# Patient Record
Sex: Female | Born: 1980 | Race: Black or African American | Hispanic: No | Marital: Single | Smoking: Current every day smoker
Health system: Southern US, Community
[De-identification: ages and names within clinical notes are randomized; demographics above are authoritative.]

## PROBLEM LIST (undated history)

## (undated) ENCOUNTER — Inpatient Hospital Stay (HOSPITAL_COMMUNITY): Payer: Self-pay

## (undated) DIAGNOSIS — Z87440 Personal history of urinary (tract) infections: Secondary | ICD-10-CM

## (undated) DIAGNOSIS — B379 Candidiasis, unspecified: Secondary | ICD-10-CM

## (undated) DIAGNOSIS — Z8619 Personal history of other infectious and parasitic diseases: Secondary | ICD-10-CM

## (undated) DIAGNOSIS — IMO0002 Reserved for concepts with insufficient information to code with codable children: Secondary | ICD-10-CM

## (undated) DIAGNOSIS — Z862 Personal history of diseases of the blood and blood-forming organs and certain disorders involving the immune mechanism: Secondary | ICD-10-CM

## (undated) DIAGNOSIS — G43909 Migraine, unspecified, not intractable, without status migrainosus: Secondary | ICD-10-CM

## (undated) DIAGNOSIS — I10 Essential (primary) hypertension: Secondary | ICD-10-CM

## (undated) DIAGNOSIS — R87619 Unspecified abnormal cytological findings in specimens from cervix uteri: Secondary | ICD-10-CM

## (undated) DIAGNOSIS — Z202 Contact with and (suspected) exposure to infections with a predominantly sexual mode of transmission: Secondary | ICD-10-CM

## (undated) HISTORY — DX: Reserved for concepts with insufficient information to code with codable children: IMO0002

## (undated) HISTORY — DX: Personal history of diseases of the blood and blood-forming organs and certain disorders involving the immune mechanism: Z86.2

## (undated) HISTORY — DX: Migraine, unspecified, not intractable, without status migrainosus: G43.909

## (undated) HISTORY — DX: Unspecified abnormal cytological findings in specimens from cervix uteri: R87.619

## (undated) HISTORY — DX: Personal history of urinary (tract) infections: Z87.440

## (undated) HISTORY — DX: Personal history of other infectious and parasitic diseases: Z86.19

## (undated) HISTORY — DX: Contact with and (suspected) exposure to infections with a predominantly sexual mode of transmission: Z20.2

## (undated) HISTORY — DX: Candidiasis, unspecified: B37.9

---
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
## 2000-01-01 ENCOUNTER — Emergency Department (HOSPITAL_COMMUNITY): Admission: EM | Admit: 2000-01-01 | Discharge: 2000-01-01 | Payer: Self-pay | Admitting: *Deleted

## 2000-01-09 ENCOUNTER — Emergency Department (HOSPITAL_COMMUNITY): Admission: EM | Admit: 2000-01-09 | Discharge: 2000-01-09 | Payer: Self-pay | Admitting: Internal Medicine

## 2000-01-14 ENCOUNTER — Emergency Department (HOSPITAL_COMMUNITY): Admission: EM | Admit: 2000-01-14 | Discharge: 2000-01-14 | Payer: Self-pay | Admitting: Emergency Medicine

## 2001-10-09 ENCOUNTER — Encounter: Payer: Self-pay | Admitting: Obstetrics and Gynecology

## 2001-10-09 ENCOUNTER — Ambulatory Visit (HOSPITAL_COMMUNITY): Admission: RE | Admit: 2001-10-09 | Discharge: 2001-10-09 | Payer: Self-pay | Admitting: Obstetrics and Gynecology

## 2002-01-21 ENCOUNTER — Inpatient Hospital Stay (HOSPITAL_COMMUNITY): Admission: AD | Admit: 2002-01-21 | Discharge: 2002-01-21 | Payer: Self-pay | Admitting: *Deleted

## 2002-01-24 ENCOUNTER — Inpatient Hospital Stay (HOSPITAL_COMMUNITY): Admission: AD | Admit: 2002-01-24 | Discharge: 2002-01-24 | Payer: Self-pay | Admitting: Obstetrics and Gynecology

## 2002-02-10 ENCOUNTER — Inpatient Hospital Stay (HOSPITAL_COMMUNITY): Admission: AD | Admit: 2002-02-10 | Discharge: 2002-02-14 | Payer: Self-pay | Admitting: Obstetrics and Gynecology

## 2002-02-11 ENCOUNTER — Encounter (INDEPENDENT_AMBULATORY_CARE_PROVIDER_SITE_OTHER): Payer: Self-pay | Admitting: Specialist

## 2002-07-17 ENCOUNTER — Inpatient Hospital Stay (HOSPITAL_COMMUNITY): Admission: AD | Admit: 2002-07-17 | Discharge: 2002-07-17 | Payer: Self-pay | Admitting: Obstetrics and Gynecology

## 2002-10-03 ENCOUNTER — Other Ambulatory Visit: Admission: RE | Admit: 2002-10-03 | Discharge: 2002-10-03 | Payer: Self-pay | Admitting: Obstetrics and Gynecology

## 2003-04-14 ENCOUNTER — Inpatient Hospital Stay (HOSPITAL_COMMUNITY): Admission: RE | Admit: 2003-04-14 | Discharge: 2003-04-17 | Payer: Self-pay | Admitting: Obstetrics and Gynecology

## 2003-04-14 ENCOUNTER — Encounter (INDEPENDENT_AMBULATORY_CARE_PROVIDER_SITE_OTHER): Payer: Self-pay | Admitting: Specialist

## 2004-06-25 ENCOUNTER — Ambulatory Visit: Payer: Self-pay | Admitting: Internal Medicine

## 2004-11-15 ENCOUNTER — Emergency Department (HOSPITAL_COMMUNITY): Admission: EM | Admit: 2004-11-15 | Discharge: 2004-11-15 | Payer: Self-pay | Admitting: Emergency Medicine

## 2006-06-06 ENCOUNTER — Inpatient Hospital Stay (HOSPITAL_COMMUNITY): Admission: AD | Admit: 2006-06-06 | Discharge: 2006-06-06 | Payer: Self-pay | Admitting: Obstetrics

## 2007-01-18 ENCOUNTER — Inpatient Hospital Stay (HOSPITAL_COMMUNITY): Admission: RE | Admit: 2007-01-18 | Discharge: 2007-01-20 | Payer: Self-pay | Admitting: Obstetrics and Gynecology

## 2008-10-01 ENCOUNTER — Ambulatory Visit: Payer: Self-pay | Admitting: Obstetrics and Gynecology

## 2008-10-01 ENCOUNTER — Inpatient Hospital Stay (HOSPITAL_COMMUNITY): Admission: AD | Admit: 2008-10-01 | Discharge: 2008-10-01 | Payer: Self-pay | Admitting: Obstetrics & Gynecology

## 2008-12-03 ENCOUNTER — Ambulatory Visit: Payer: Self-pay | Admitting: Obstetrics & Gynecology

## 2008-12-03 ENCOUNTER — Encounter: Payer: Self-pay | Admitting: Family

## 2009-07-01 ENCOUNTER — Emergency Department (HOSPITAL_COMMUNITY): Admission: EM | Admit: 2009-07-01 | Discharge: 2009-07-01 | Payer: Self-pay | Admitting: Emergency Medicine

## 2010-08-07 LAB — POCT URINALYSIS DIP (DEVICE)
Bilirubin Urine: NEGATIVE
Specific Gravity, Urine: 1.02 (ref 1.005–1.030)

## 2010-08-07 LAB — POCT PREGNANCY, URINE: Preg Test, Ur: NEGATIVE

## 2010-08-09 LAB — COMPREHENSIVE METABOLIC PANEL
AST: 22 U/L (ref 0–37)
BUN: 8 mg/dL (ref 6–23)
CO2: 23 mEq/L (ref 19–32)
Calcium: 9 mg/dL (ref 8.4–10.5)
Creatinine, Ser: 0.77 mg/dL (ref 0.4–1.2)
Sodium: 137 mEq/L (ref 135–145)
Total Protein: 6.9 g/dL (ref 6.0–8.3)

## 2010-08-09 LAB — URINE MICROSCOPIC-ADD ON

## 2010-08-09 LAB — URINALYSIS, ROUTINE W REFLEX MICROSCOPIC
Glucose, UA: NEGATIVE mg/dL
Ketones, ur: 15 mg/dL — AB
Protein, ur: NEGATIVE mg/dL

## 2010-08-09 LAB — CBC
Hemoglobin: 13.6 g/dL (ref 12.0–15.0)
MCV: 100.6 fL — ABNORMAL HIGH (ref 78.0–100.0)
Platelets: 199 10*3/uL (ref 150–400)
RBC: 3.78 MIL/uL — ABNORMAL LOW (ref 3.87–5.11)
RDW: 12.8 % (ref 11.5–15.5)
WBC: 15.5 10*3/uL — ABNORMAL HIGH (ref 4.0–10.5)

## 2010-08-09 LAB — WET PREP, GENITAL: Yeast Wet Prep HPF POC: NONE SEEN

## 2010-08-09 LAB — GC/CHLAMYDIA PROBE AMP, GENITAL: GC Probe Amp, Genital: NEGATIVE

## 2010-09-14 NOTE — Op Note (Signed)
NAME:  Anita Fuentes, Anita Fuentes                ACCOUNT NO.:  [id number]   MEDICAL RECORD NO.:  [id number]          PATIENT TYPE:  INP   LOCATION:  9199                          FACILITY:  [facility]   PHYSICIAN:  Hal Morales, M.D.DATE OF BIRTH:  [birth year]   DATE OF PROCEDURE:  01/18/2007  DATE OF DISCHARGE:                               OPERATIVE REPORT   PREOPERATIVE DIAGNOSES:  1. Intrauterine pregnancy at 39 weeks' gestation  2. Prior cesarean section.  3. Desire for repeat cesarean section.   POSTOPERATIVE DIAGNOSES:  1. Intrauterine pregnancy at 39 weeks' gestation  2. Prior cesarean section.  3. Desire for repeat cesarean section.   OPERATION:  Repeat low transverse cesarean section.   SURGEON:  Dierdre Forth, MD   FIRST ASSISTANT:  Erin Sons, Certified Nurse Midwife.   ANESTHESIA:  Spinal.   ESTIMATED BLOOD LOSS:  750 mL.   COMPLICATIONS:  None.   FINDINGS:  The patient was delivered of a female infant weighing 6  pounds 5 ounces with Apgars of 9 and 9 at 1 and 5 minutes, respectively.  The uterus, tubes and ovaries were normal for the gravid state.  The  placenta contained an eccentrically inserted three-vessel cord.   PROCEDURE:  The patient was taken to the operating room after  appropriate identification and placed on the operating table.  After the  attainment of adequate spinal anesthesia, she was placed in the supine  position with a left lateral tilt.  The abdomen and perineum were  prepped with multiple layers of Betadine and a Foley catheter inserted  into the bladder under sterile conditions and connected to straight  drainage.  The abdomen was draped as a sterile field.  After assurance  of adequate anesthesia, the site of the previous cesarean section  incision was infiltrated with 20 mL of 0.25% Marcaine.  A suprapubic  incision was made at that site and the abdomen opened in layers.  The  peritoneum was entered and the bladder blade placed.  The uterus  was  incised approximately 2 cm above the uterovesical fold and that incision  taken laterally on either side bluntly.  The infant was delivered from  the occipitotransverse position and after having the nares and pharynx  suctioned and the cord clamped and cut, was handed off to the awaiting  pediatricians.  The appropriate cord blood was drawn and the placenta  allowed to separate from the uterus and was removed from the operative  field.  Uterine incision was closed with a running interlocking suture  of 0 Vicryl.  An imbricating suture of 0 Vicryl was placed and  hemostasis was achieved with a figure-of-eight suture of 0 Vicryl in the  incision.  Copious irrigation was carried out.  The abdominal peritoneum  was closed with a running suture of 2-0 Vicryl.  The rectus fascia was  closed with a running suture of 0 Vicryl, then reinforced on either side  of midline with figure-of-eight sutures of 0 Vicryl.  Subcutaneous  tissue was irrigated and made hemostatic with Bovie cautery.  The skin  incision was closed with a  subcuticular suture of 3-0 Monocryl.  Steri-  Strips were applied and a sterile dressing applied.  The patient was  taken from the operating room to the recovery room in satisfactory  condition, having tolerated the procedure well.  Sponge and instrument  counts were correct.  The infant went to the full-term nursery.      Hal Morales, M.D.  Electronically Signed     VPH/MEDQ  D:  01/18/2007  T:  01/18/2007  Job:  [id number]

## 2010-09-14 NOTE — H&P (Signed)
NAME:  Anita Fuentes, Anita Fuentes                ACCOUNT NO.:  [id number]   MEDICAL RECORD NO.:  [id number]          PATIENT TYPE:  INP   LOCATION:  NA                            [facility]   PHYSICIAN:  Vicki L. Emilee Hero, C.N.M.DATE OF BIRTH:  [birth year]   DATE OF ADMISSION:  01/18/2007  DATE OF DISCHARGE:                              HISTORY & PHYSICAL   ADDENDUM:  Addendum to dictation [phone].   PRENATAL LABS:  Hemoglobin at OB visit was 10.5, hematocrit 31.2,  platelet count was 225. Hepatitis B surface antigen was negative.  Rubella titer was immune. RPR was reactive with a positive __________  and a titer of 1:2 in April at her OB visit. Her blood type was O+,  antibody screen was negative. Quadruple screen was normal. Her HIV was  nonreactive. Her RPR titers to follow were, by July 16 titer was 1:4,  September 15 was 1:8 and September 17 was 1:2. On August 28, she had  negative group B strep vaginal cultures however, on September 3 she had  a positive urine culture for group B strep. Her Glucola was 80. She had  a negative GC and chlamydia culture in the third trimester.      Renaldo Reel Emilee Hero, C.N.M.     VLL/MEDQ  D:  01/17/2007  T:  01/17/2007  Job:  [id number]

## 2010-09-14 NOTE — Discharge Summary (Signed)
NAME:  Anita Fuentes, Anita Fuentes                ACCOUNT NO.:  [id number]   MEDICAL RECORD NO.:  [id number]          PATIENT TYPE:  INP   LOCATION:  9139                          [facility]   PHYSICIAN:  Crist Fat. Rivard, M.D. DATE OF BIRTH:  1980-10-25   DATE OF ADMISSION:  01/18/2007  DATE OF DISCHARGE:  01/20/2007                               DISCHARGE SUMMARY   ADMITTING DIAGNOSES:  1. Intrauterine pregnancy at 39 weeks.  2. Previous cesarean section x2 with desire for repeat.   DISCHARGE DIAGNOSES:  1. Intrauterine pregnancy at 39 weeks.  2. Previous cesarean section x2 with desire for repeat.   PROCEDURE:  Repeat low transverse cesarean section by Dr. Dierdre Forth with Nigel Bridgeman, CNM as first assistant.   HOSPITAL COURSE:  Anita Fuentes is a 30 year old, gravida 3, para 2-0-0-2 at  93 weeks who presents for scheduled repeat cesarean section.  Her  pregnancy has been followed by [facility] MD service and  has been remarkable for:  1. Previous cesarean section x2 with plan for repeat.  2. First trimester spotting.  3. History of STD.  4. Smoker.  5. History of syphilis.  6. Group B strep positive.   The patient was taken to the operating room where a repeat low  transverse cesarean section was performed. The infant was a viable  female weighing 6 pounds and 5 ounces with Apgar's of 9 at 1 minute and  9 at 5 minutes. The patient tolerated the remainder of the procedure  well and was taken to the recovery room in good condition. The infant  was taken to the full-term nursery and was doing well. By postop day #1,  her vital signs were stable.  She was afebrile.  Hemoglobin was 9.5 and  had been 11.2 preoperatively. Her other lab work was within normal  limits. The patient expressed a desire for early discharge. By postop  day #2 she continued to do well, infant was bottle feeding.  The patient  was wanting Depo-Provera for contraception and she desired to receive  her first dose in-hospital. Her vital signs remained stable. She was  afebrile.  She was deemed to have received the full benefit of her  hospital stay and she was discharged home.   DISCHARGE MEDICATIONS:  1. Motrin 600 mg 1 p.o. q.6 h p.r.n. pain.  2. Tylox 1-2 p.o. q.3-4 h p.r.n. pain.   DISCHARGE INSTRUCTIONS:  Per [facility] handout.   DISCHARGE FOLLOW-UP:  Will occur at [facility] OB[facility]GYN in 6 weeks  or as needed.      Cam Hai, C.N.M.      Crist Fat Rivard, M.D.  Electronically Signed    KS/MEDQ  D:  01/20/2007  T:  01/21/2007  Job:  [id number]
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
## 2010-09-17 NOTE — Discharge Summary (Signed)
NAME:  Anita Fuentes, Anita Fuentes                          ACCOUNT NO.:  [id number]   MEDICAL RECORD NO.:  [id number]                   PATIENT TYPE:  INP   LOCATION:  9102                                 [facility]   PHYSICIAN:  [name], M.D.                DATE OF BIRTH:  February 26, 1981   DATE OF ADMISSION:  02/10/2002  DATE OF DISCHARGE:  02/14/2002                                 DISCHARGE SUMMARY   ADMISSION DIAGNOSES:  1. Intrauterine pregnancy at term.  2. Early labor.  3. Positive RPR titers status post therapy.   DISCHARGE DIAGNOSES:  1. Intrauterine pregnancy at term.  2. Early labor.  3. Positive RPR titers status post therapy.  4. Failure to progress in labor.  5. Status post primary low transverse cesarean section.  6. Bottle feeding.  7. Desires Ortho-Evra for contraception.  8. Intrapartal hypertension.   PROCEDURE:  Primary low transverse cesarean section for delivery of a viable  female infant named Dennie Fetters who weighed 6 pounds 14 ounces and had Apgars of 9  and 9 on February 11, 2002 attended by Dr. Dierdre Forth.   HOSPITAL COURSE:  The patient is a 30 year old single black female gravida  1, para 0 at term who presented complaining of contractions every two to  three minutes.  She was uncomfortable with her uterine contractions and was  in early labor.  On admission she was 2, 80%, -2, and vertex.  She did have  some blood pressure elevations with diastolics over 100 on admission.  Her  other [facility] laboratories were within normal limits and protein was negative.  She did also receive a urine drug screen with consent and that was negative.  She progressed to approximately 4 cm, but failed to progress beyond that  even with adequate labor documented in [facility].  At that point she was  recommended to proceed with a cesarean section for delivery and underwent  the same.  She delivered a viable female infant named Dennie Fetters who weighed 6  pounds 14 ounces and had Apgars of 9  and 9.  He was noted to be in an OP  presentation.  Her cesarean section was done by Dr. Dierdre Forth.  Postoperatively the patient has done well.  She is ambulating, voiding, and  eating without difficulty.  Her vital signs are stable and she has been  afebrile throughout her hospital stay.  Since delivery she has had no  diastolic blood pressures greater than 90.  She is now ambulating, voiding,  and eating also without difficulty.  She is bottle feeding without  difficulty.  She desires Ortho-Evra patch for contraception.  She has had a  positive RPR with this pregnancy with a titer that rose to 16 late in her  pregnancy.  Received therapy approximately two weeks ago and her titers now  are down to 4.  She is deemed ready for  discharge today.   DISCHARGE INSTRUCTIONS:  As per the [facility] OB/GYN handout.   DISCHARGE MEDICATIONS:  1. Motrin 600 mg p.o. q.6h. p.r.n. for pain.  2. Tylox number two p.o. q.4-6h. p.r.n. for pain.  3. Prenatal vitamins daily.   DISCHARGE LABORATORIES:  Her hemoglobin is 8.9, WBC count 19.0, platelets  138,000.   DISCHARGE FOLLOWUP:  Six weeks at [facility] OB[facility]GYN or p.r.n.     Concha Pyo. Duplantis, C.N.M.              [name], M.D.    SJD/MEDQ  D:  02/14/2002  T:  02/14/2002  Job:  [id number]
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
## 2010-09-17 NOTE — Op Note (Signed)
NAME:  Anita Fuentes, Anita Fuentes                          ACCOUNT NO.:  [id number]   MEDICAL RECORD NO.:  [id number]                   PATIENT TYPE:  INP   LOCATION:  9102                                 [facility]   PHYSICIAN:  Hal Morales, M.D.             DATE OF BIRTH:  22-Oct-1980   DATE OF PROCEDURE:  02/11/2002  DATE OF DISCHARGE:                                 OPERATIVE REPORT   PREOPERATIVE DIAGNOSES:  1. Intrauterine pregnancy at term.  2. Failure to progress in labor.  3. History of a positive RPR.   POSTOPERATIVE DIAGNOSES:  1. Intrauterine pregnancy at term.  2. Failure to progress in labor.  3. History of a positive RPR.  4. Occiput posterior.   PROCEDURE:  Primary low transverse cesarean section.   SURGEON:  Hal Morales, M.D.   ASSISTANT:  Rica Koyanagi, C.N.M.   ANESTHESIA:  Epidural.   ESTIMATED BLOOD LOSS:  750 cc.   COMPLICATIONS:  None.   FINDINGS:  The patient was delivered of a female infant, whose name is Dennie Fetters,  weighing 6 pounds 14 ounces, with Apgars of 9 and 9 at one and five minutes,  respectively.  The uterus, tubes, and ovaries were normal for the gravid  state.   DESCRIPTION OF PROCEDURE:  The patient was taken to the operating room after  appropriate identification and after a discussion of the indications for her  procedure as well as the risks involved, which include but are not limited  to anesthesia, bleeding, infection, and damage to adjacent organs.  The  patient was placed on the operating table, and the labor epidural was dosed  for surgical anesthesia.  Her Foley catheter was in place.  The abdomen was  prepped with multiple layers of Betadine and draped as a sterile field.  After assurance of adequate anesthesia, a transverse incision was made in  the abdomen and the abdomen opened in layers.  The peritoneum was entered  and the bladder blade placed.  The uterus was incised approximately 2 cm  above the uterovesical  fold and the infant delivered from the occiput  posterior position.  The nares and pharynx were suctioned and the cord  clamped and cut.  The infant was handed off to the awaiting pediatricians.  Appropriate cord blood was drawn and the placenta noted to have separated  from the uterus and was removed from the operative field.  The uterine  incision was closed with a running interlocking suture of 0 Vicryl.  Imbricating of 0 Vicryl was placed and hemostasis was noted to be adequate.  Copious irrigation was carried out and the abdominal peritoneum closed with  a running suture of 2-0 Vicryl.  The rectus muscles were reapproximated in  the midline with figure-of-eight suture of 2-0 Vicryl.  The rectus fascia  was closed with a running suture of 0 Vicryl and reinforced on either side  of midline with figure-of-eight sutures of 0 Vicryl.  The subcutaneous  tissue was irrigated and made hemostatic with Bovie cautery.  Skin staples  were applied to the skin incision.  A sterile dressing was applied.  The  patient was taken from the operating room to the recovery room in  satisfactory condition, having tolerated the procedure well, with sponge and  instrument counts correct.  The infant went to the full-term nursery.                                                Hal Morales, M.D.    VPH/MEDQ  D:  02/11/2002  T:  02/11/2002  Job:  [id number]
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
## 2010-09-17 NOTE — Discharge Summary (Signed)
NAME:  Anita Fuentes, Anita Fuentes                          ACCOUNT NO.:  [id number]   MEDICAL RECORD NO.:  [id number]                   PATIENT TYPE:  INP   LOCATION:  9101                                 [facility]ILITY[facility]  [facility]   PHYSICIAN:  Hal Morales, M.D.             DATE OF BIRTH:  [birth year]   DATE OF ADMISSION:  04/14/2003  DATE OF DISCHARGE:  04/17/2003                                 DISCHARGE SUMMARY   ADMISSION DIAGNOSES:  1. Term pregnancy.  2. Prior cesarean section.   DISCHARGE DIAGNOSES:  1. Term pregnancy.  2. Prior cesarean section.  3. Status post repeat low transverse cesarean section for a viable female     infant named Kaiman weighing 5 pounds 15 ounces, Apgars 9 and 9.   PROCEDURE:  1. Spinal anesthesia.  2. Repeat low transverse cesarean section.   HOSPITAL COURSE:  The patient was admitted for an elective repeat low  transverse cesarean section which was performed under spinal anesthesia by  Dr. Pennie Rushing.  There were no complications during the surgery.  On  postoperative day #1 she was ambulating, voiding, and eating without  difficulty.  She elected to bottle feed her infant.  Discussion was made  regarding Depo-Provera and its potential for impact on bone density loss and  patient elected to proceed with Depo-Provera for contraception.  Her vital  signs were stable.  Hemoglobin was 11.9.  Abdomen was soft and appropriately  tender.  Dressing was clean, dry, and intact with old drainage and her RPR  titer was stable at 2.  On the next two days patient continued to improve.  On postoperative day #3 her vital signs were stable.  She was afebrile.  Heart regular rate and rhythm.  Lungs clear to auscultation.  Abdomen soft  and appropriately tender.  Incision clean, dry, and intact.  Lochia was  small.  Extremities within normal limits.  The patient was deemed to have  received the full benefit of her hospital stay and was discharged home.   DISCHARGE MEDICATIONS:  1.  Tylox one to two p.o. q.4h. p.r.n.  2. Motrin 600 mg p.o. q.6h. p.r.n.   DISCHARGE LABORATORIES:  White blood cell count 10.6, hemoglobin 10.4,  hematocrit 28.9, platelet count 147,000 which was previously 163,000.   DISCHARGE INSTRUCTIONS:  Per [facility] handout.   DISCHARGE FOLLOWUP:  Six weeks or p.r.n.    Marie L. Williams, C.N.M.                 Hal Morales, M.D.   MLW/MEDQ  D:  04/17/2003  T:  04/17/2003  Job:  [id number]

## 2010-09-17 NOTE — Op Note (Signed)
NAME:  MARTICIA, REIFSCHNEIDER                          ACCOUNT NO.:  [id number]   MEDICAL RECORD NO.:  [id number]                   PATIENT TYPE:  INP   LOCATION:  9198                                 [facility]ILITY[facility]   PHYSICIAN:  Hal Morales, M.D.             DATE OF BIRTH:  12/20/1980   DATE OF PROCEDURE:  04/14/2003  DATE OF DISCHARGE:                                 OPERATIVE REPORT   PREOPERATIVE DIAGNOSES:  1. Intrauterine pregnancy at term.  2. Prior cesarean section, desire for repeat cesarean section.   POSTOPERATIVE DIAGNOSES:  1. Intrauterine pregnancy at term.  2. Prior cesarean section, desire for repeat cesarean section.   OPERATION:  Repeat low transverse cesarean section.   ANESTHESIA:  Spinal anesthesia.   ESTIMATED BLOOD LOSS:  750 mL.   COMPLICATIONS:  None.   SURGEON:  Hal Morales, M.D.   FIRST ASSISTANT:  Rica Koyanagi, C.N.M.   FINDINGS:  The patient was delivered of a female infant whose name is Kaiman,  with Apgars of 9 and 9 at one and five minutes, respectively.  The uterus,  tubes and ovaries were normal for the gravid state.   DESCRIPTION OF PROCEDURE:  The patient was taken to the operating room after  appropriate identification and placed on the operating table.  After  placement of a spinal anesthetic, the patient was placed in the supine  position with a left lateral tilt.  The abdomen and perineum were prepped  with multiple layers of Betadine and a Foley catheter inserted into the  bladder and connected to straight drainage.  The abdomen was draped as a  sterile field.  A solution of 0.25% Marcaine was infiltrated a the site of  the previous cesarean section incision and an incision made.  The abdomen  was opened in layers.  The peritoneum was entered and the bladder blade  placed.  The uterus was incised approximately 2 cm above the ureterovesical  fold and the infant delivered from the occiput transverse position.  After  the  nares and pharynx were suctioned and the nuchal cord reduced, then  clamped and cut, the infant was handed off to the awaiting pediatricians.  The appropriate cord blood was drawn and the placenta noted to have  separated from the uterus and was removed from the operative field and sent  to pathology.  The uterine incision as closed with running interlocking  suture of 0 Vicryl.  An imbricating suture of 0 Vicryl was then place.  Copious irrigation was carried out and hemostasis noted to be adequate.  The  abdominal peritoneum was closed with a running suture of 2-0 Vicryl.  The  rectus muscles were reapproximated in the midline with figure-of-eight  suture of 2-0 Vicryl.  The rectus muscles were noted to be hemostatic and  were irrigated.  The rectus fascia was closed with running suture of 0  Vicryl, then reinforced  on either side of midline with figure-of-eight  sutures of 0 Vicryl.  The subcutaneous tissue was irrigated and made  hemostatic with Bovie cautery.  Skin staples were applied to the skin  incision and sterile dressing applied.  The patient was taken from the  operating room to the recovery room in satisfactory condition having  tolerated the procedure well with sponge and instrument counts correct.  The  infant went to the full-term nursery.                                               Hal Morales, M.D.    VPH/MEDQ  D:  04/14/2003  T:  04/14/2003  Job:  [id number]
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
## 2011-02-10 LAB — RAPID HIV SCREEN (WH-MAU): Rapid HIV Screen: NONREACTIVE

## 2011-02-10 LAB — CBC
HCT: 26.9 — ABNORMAL LOW
HCT: 31 — ABNORMAL LOW
Hemoglobin: 9.5 — ABNORMAL LOW
MCHC: 36.1 — ABNORMAL HIGH
RBC: 2.7 — ABNORMAL LOW
RBC: 3.14 — ABNORMAL LOW
RDW: 13.8
RDW: 14.5 — ABNORMAL HIGH

## 2011-02-10 LAB — RPR: RPR Ser Ql: REACTIVE — AB

## 2011-02-25 LAB — TPPA: Treponema Confirm: REACTIVE — AB

## 2011-08-06 ENCOUNTER — Encounter (HOSPITAL_COMMUNITY): Payer: Self-pay | Admitting: Obstetrics and Gynecology

## 2011-08-06 ENCOUNTER — Inpatient Hospital Stay (HOSPITAL_COMMUNITY): Payer: Medicaid Other

## 2011-08-06 ENCOUNTER — Inpatient Hospital Stay (HOSPITAL_COMMUNITY)
Admission: AD | Admit: 2011-08-06 | Discharge: 2011-08-06 | Disposition: A | Discharge status: Home / Self Care | Payer: Medicaid Other | Source: Ambulatory Visit | Attending: Obstetrics and Gynecology | Admitting: Obstetrics and Gynecology

## 2011-08-06 DIAGNOSIS — O99891 Other specified diseases and conditions complicating pregnancy: Secondary | ICD-10-CM | POA: Insufficient documentation

## 2011-08-06 DIAGNOSIS — N949 Unspecified condition associated with female genital organs and menstrual cycle: Secondary | ICD-10-CM

## 2011-08-06 DIAGNOSIS — Z331 Pregnant state, incidental: Secondary | ICD-10-CM

## 2011-08-06 DIAGNOSIS — R1032 Left lower quadrant pain: Secondary | ICD-10-CM | POA: Insufficient documentation

## 2011-08-06 DIAGNOSIS — R1031 Right lower quadrant pain: Secondary | ICD-10-CM | POA: Insufficient documentation

## 2011-08-06 DIAGNOSIS — R102 Pelvic and perineal pain: Secondary | ICD-10-CM

## 2011-08-06 LAB — URINALYSIS, ROUTINE W REFLEX MICROSCOPIC
Bilirubin Urine: NEGATIVE
Leukocytes, UA: NEGATIVE
Nitrite: NEGATIVE
pH: 6 (ref 5.0–8.0)

## 2011-08-06 LAB — WET PREP, GENITAL
Trich, Wet Prep: NONE SEEN
Yeast Wet Prep HPF POC: NONE SEEN

## 2011-08-06 LAB — HCG, QUANTITATIVE, PREGNANCY: hCG, Beta Chain, Quant, S: 17539 m[IU]/mL — ABNORMAL HIGH (ref <5)

## 2011-08-06 NOTE — Progress Notes (Signed)
Conni Elliot CNM in mother baby discharging a patient. She will be down to discharge pt. Ok for patient to wait in lobby until she comes down to talk to her. Pt ok with plan of care.

## 2011-08-06 NOTE — MAU Provider Note (Signed)
History    CSN: [id number]  Arrival date and time: 08/06/11 8657   First Provider Initiated Contact with Patient 08/06/11 1038      Chief Complaint  Patient presents with  . Abdominal Pain   Abdominal Pain This is a new problem. The current episode started in the past 7 days. The onset quality is sudden. The problem occurs intermittently. The problem has been unchanged. The pain is located in the LLQ and RLQ. The pain is moderate. The quality of the pain is burning and sharp. The abdominal pain does not radiate.   Pt presents unannounced to MAU with c/o intermittent lower abdominal pain x 3 days, right greater than left today.  Reports pain is increased with movement such as sitting up and rolling over.  Denies fever, nausea, vomiting, diarrhea, constipation or increased flatus.  Reports some spotting last week.  Denies increased vag d/c, itching, burning or odor.  Has appt with [facility] for NOB workup on 08/17/11.   OB History    Grav Para Term Preterm Abortions TAB SAB Ect Mult Living   4 3 3  0 0 0 0 0 0 3      Past Medical History  Diagnosis Date  . No pertinent past medical history     Past Surgical History  Procedure Date  . Cesarean section     Family History  Problem Relation Age of Onset  . Diabetes Father   . Hypertension Father   . Hypertension Sister     History  Substance Use Topics  . Smoking status: Current Everyday Smoker -- 0.5 packs/day for 10 years    Types: Cigarettes  . Smokeless tobacco: Never Used  . Alcohol Use: 1.8 oz/week    3 Cans of beer per week     3/day until pos UPT    Allergies: No Known Allergies  Prescriptions prior to admission  Medication Sig Dispense Refill  . Prenatal Vit-Fe Fumarate-FA (PRENATAL MULTIVITAMIN) TABS Take 1 tablet by mouth daily.        Review of Systems  Constitutional: Negative.   HENT: Negative.   Eyes: Negative.   Respiratory: Negative.   Cardiovascular: Negative.   Gastrointestinal: Positive for  abdominal pain.  Genitourinary: Negative.   Musculoskeletal: Negative.   Skin: Negative.   Neurological: Negative.   Endo/Heme/Allergies: Negative.   Psychiatric/Behavioral: Negative.    Physical Exam   Blood pressure 120/65, pulse 81, temperature 98.4 F (36.9 C), temperature source Oral, resp. rate 18, height 5\' 4"  (1.626 m), last menstrual period 06/24/2011.  Physical Exam  Constitutional: She is oriented to person, place, and time. She appears well-developed and well-nourished.  HENT:  Head: Normocephalic and atraumatic.  Right Ear: External ear normal.  Left Ear: External ear normal.  Nose: Nose normal.  Eyes: Conjunctivae are normal. Pupils are equal, round, and reactive to light.  Neck: Normal range of motion. Neck supple. No thyromegaly present.  Cardiovascular: Normal rate, regular rhythm and intact distal pulses.   Respiratory: Effort normal and breath sounds normal.  GI: Soft. Bowel sounds are normal. There is no tenderness. There is no rebound and no guarding.  Genitourinary: Vagina normal and uterus normal.       Ext gent/BUS WNL.  Scant white discharge in vault.  Cx without lesions or discharge.  Neg CMT.  Ut mobile, NT, retroverted, approx 6wk size.  Adnexa without masses or tenderness.    Musculoskeletal: Normal range of motion.  Neurological: She is alert and oriented to person, place, and  time. She has normal reflexes.  Skin: Skin is warm and dry.  Psychiatric: She has a normal mood and affect. Her behavior is normal.    MAU Course  Procedures Results for orders placed during the hospital encounter of 08/06/11 (from the past 24 hour(s))  URINALYSIS, ROUTINE W REFLEX MICROSCOPIC     Status: Normal   Collection Time   08/06/11  9:10 AM      Component Value Range   Color, Urine YELLOW  YELLOW    APPearance CLEAR  CLEAR    Specific Gravity, Urine 1.025  1.005 - 1.030    pH 6.0  5.0 - 8.0    Glucose, UA NEGATIVE  NEGATIVE (mg/dL)   Hgb urine dipstick NEGATIVE   NEGATIVE    Bilirubin Urine NEGATIVE  NEGATIVE    Ketones, ur NEGATIVE  NEGATIVE (mg/dL)   Protein, ur NEGATIVE  NEGATIVE (mg/dL)   Urobilinogen, UA 0.2  0.0 - 1.0 (mg/dL)   Nitrite NEGATIVE  NEGATIVE    Leukocytes, UA NEGATIVE  NEGATIVE   POCT PREGNANCY, URINE     Status: Abnormal   Collection Time   08/06/11  9:26 AM      Component Value Range   Preg Test, Ur POSITIVE (*) NEGATIVE   HCG, QUANTITATIVE, PREGNANCY     Status: Abnormal   Collection Time   08/06/11  9:40 AM      Component Value Range   hCG, Beta Chain, Quant, S 17539 (*) <5 (mIU/mL)   Assessment and Plan  Lower abdominal pain Positive pregnancy test  Gc/Chl and wet prep obtained Will check [place] to rule out ectopic.  US shows IUP.  Pt to follow up as scheduled in office  SMITH,NONA O. 08/06/2011, 11:20 AM

## 2011-08-08 LAB — GC/CHLAMYDIA PROBE AMP, GENITAL: Chlamydia, DNA Probe: NEGATIVE

## 2011-08-17 ENCOUNTER — Ambulatory Visit (INDEPENDENT_AMBULATORY_CARE_PROVIDER_SITE_OTHER): Payer: Medicaid Other | Admitting: Obstetrics and Gynecology

## 2011-08-17 DIAGNOSIS — Z331 Pregnant state, incidental: Secondary | ICD-10-CM

## 2011-08-17 LAB — POCT URINALYSIS DIPSTICK: pH, UA: 6

## 2011-08-18 LAB — PRENATAL PANEL VII
Basophils Absolute: 0 10*3/uL (ref 0.0–0.1)
Basophils Relative: 0 % (ref 0–1)
HIV: NONREACTIVE
Lymphocytes Relative: 29 % (ref 12–46)
MCHC: 33.5 g/dL (ref 30.0–36.0)
Monocytes Absolute: 0.5 10*3/uL (ref 0.1–1.0)
Neutro Abs: 7.4 10*3/uL (ref 1.7–7.7)
Neutrophils Relative %: 66 % (ref 43–77)
Platelets: 224 10*3/uL (ref 150–400)
RDW: 12.5 % (ref 11.5–15.5)
RPR Ser Ql: REACTIVE — AB
Rh Type: POSITIVE
WBC: 11.2 10*3/uL — ABNORMAL HIGH (ref 4.0–10.5)

## 2011-08-19 LAB — HEMOGLOBINOPATHY EVALUATION
Hemoglobin Other: 0 %
Hgb A2 Quant: 2.4 % (ref 2.2–3.2)
Hgb A: 96.3 % — ABNORMAL LOW (ref 96.8–97.8)
Hgb F Quant: 1.3 % (ref 0.0–2.0)
Hgb S Quant: 0 %

## 2011-08-19 LAB — CULTURE, OB URINE: Colony Count: 40000

## 2011-08-23 LAB — T.PALLIDUM AB, TOTAL: T pallidum Antibodies (TP-PA): >8 S/CO — ABNORMAL HIGH (ref <0.90)

## 2011-08-25 ENCOUNTER — Encounter: Payer: Self-pay | Admitting: Obstetrics and Gynecology

## 2011-08-26 ENCOUNTER — Encounter: Payer: Self-pay | Admitting: Obstetrics and Gynecology

## 2011-08-26 NOTE — Progress Notes (Signed)
Patient ID: Anita Fuentes, female   DOB: January 27, 1981, 31 y.o.   MRN: [id number]  NOB labs with     POSITIVE RPR Antibody 1:2  Repeated at 1:4  Chart reviewed Known with persistent low titers since at least 2003 Was retreated in September 2003 during pregnancy for rising titers 1:16 with Elisabeth Most PENG 24 M X1 Titers declined and have remained 1:2 to 1:4 since ([id number]) Plan is to check titers monthly and treat if rise > 1:4  Silverio Lay MD

## 2011-09-05 ENCOUNTER — Encounter: Payer: Self-pay | Admitting: Obstetrics and Gynecology

## 2011-09-05 ENCOUNTER — Ambulatory Visit (INDEPENDENT_AMBULATORY_CARE_PROVIDER_SITE_OTHER): Payer: Medicaid Other | Admitting: Obstetrics and Gynecology

## 2011-09-05 VITALS — BP 104/64 | Wt 157.0 lb

## 2011-09-05 DIAGNOSIS — N898 Other specified noninflammatory disorders of vagina: Secondary | ICD-10-CM

## 2011-09-05 DIAGNOSIS — A539 Syphilis, unspecified: Secondary | ICD-10-CM

## 2011-09-05 DIAGNOSIS — Z720 Tobacco use: Secondary | ICD-10-CM

## 2011-09-05 DIAGNOSIS — Z331 Pregnant state, incidental: Secondary | ICD-10-CM | POA: Insufficient documentation

## 2011-09-05 DIAGNOSIS — Z9189 Other specified personal risk factors, not elsewhere classified: Secondary | ICD-10-CM

## 2011-09-05 DIAGNOSIS — Z9289 Personal history of other medical treatment: Secondary | ICD-10-CM

## 2011-09-05 DIAGNOSIS — Z9889 Other specified postprocedural states: Secondary | ICD-10-CM

## 2011-09-05 DIAGNOSIS — F17200 Nicotine dependence, unspecified, uncomplicated: Secondary | ICD-10-CM

## 2011-09-05 DIAGNOSIS — Z98891 History of uterine scar from previous surgery: Secondary | ICD-10-CM | POA: Insufficient documentation

## 2011-09-05 DIAGNOSIS — O98119 Syphilis complicating pregnancy, unspecified trimester: Secondary | ICD-10-CM

## 2011-09-05 MED ORDER — METRONIDAZOLE 500 MG PO TABS: 500.0000 mg | ORAL_TABLET | Freq: Two times a day (BID) | ORAL | Status: AC

## 2011-09-05 NOTE — Progress Notes (Signed)
Patient ID: Anita Fuentes, female   DOB: 07/16/1980, 31 y.o.   MRN: [id number] TAWNYA Fuentes is a 31 y.o. female presenting for NOB certain LMP and [place] agrees with dates   @IPILAPH @ OB History    Grav Para Term Preterm Abortions TAB SAB Ect Mult Living   4 3 3  0 0 0 0 0 0 3     Past Medical History  Diagnosis Date  . No pertinent past medical history   . Migraines     TYPICALLY TAKES EXCEDERIN MIGRAINE   Past Surgical History  Procedure Date  . Cesarean section    Family History: family history includes Diabetes in her father and sister; Hypertension in her mother and sister; and Thyroid disease in her maternal uncle. Social History:  reports that she has been smoking Cigarettes.  She has a 5 pack-year smoking history. She has never used smokeless tobacco. She reports that she does not drink alcohol or use illicit drugs.  @ROS @    Blood pressure 104/64, weight 157 lb (71.215 kg), last menstrual period 06/24/2011. Physical exam: Calm, no distress, lungs clear bilaterally, AP RRR, abd soft, gravid, nt, bowel sounds active no edema to lower extremities, pelvis adequate, EGBUS WNL, cervix pink moist, vagina normal rugae, FH 10 week size  Prenatal labs: ABO, Rh: O/POS/-- (04/17 1143) Antibody: NEG (04/17 1143) Rubella:  Immune RPR: REACTIVE (04/17 1143)  HBsAg: NEGATIVE (04/17 1143)  HIV: NON REACTIVE (04/17 1143):     Assessment/Plan: Plans 1 st trimester screen, f/o syphillis titer q 4 weeks plan of care based on titers(age 31 syphillis with documented treatment 2008 at [facility])  Collaboration with Dr. Su Hilt per telephone [facility], [facility] 09/05/2011, 6:32 PM [name], CNM

## 2011-09-05 NOTE — Progress Notes (Signed)
C/o pelvic pain when coughing or moving too fast hospital visit 08/13/11 for the  same issue

## 2011-09-07 LAB — PAP IG, CT-NG NAA, HPV HIGH-RISK: HPV DNA High Risk: NOT DETECTED

## 2011-09-19 ENCOUNTER — Encounter: Payer: Self-pay | Admitting: Obstetrics and Gynecology

## 2011-09-20 ENCOUNTER — Telehealth: Payer: Self-pay | Admitting: Obstetrics and Gynecology

## 2011-09-20 NOTE — Telephone Encounter (Signed)
Triage/epic 

## 2011-09-21 NOTE — Telephone Encounter (Signed)
Spoke with Elfredia Nevins from [facility] state  Health dept wants to know if pt had recent RPR done inform 08/17/11 1:4 he voiced understanding

## 2011-10-03 ENCOUNTER — Ambulatory Visit (INDEPENDENT_AMBULATORY_CARE_PROVIDER_SITE_OTHER): Payer: Medicaid Other

## 2011-10-03 ENCOUNTER — Other Ambulatory Visit: Payer: Self-pay | Admitting: Obstetrics and Gynecology

## 2011-10-03 DIAGNOSIS — Z331 Pregnant state, incidental: Secondary | ICD-10-CM

## 2011-10-03 DIAGNOSIS — Z36 Encounter for antenatal screening of mother: Secondary | ICD-10-CM

## 2011-10-03 LAB — US OB COMP + 14 WK

## 2011-10-07 ENCOUNTER — Ambulatory Visit (INDEPENDENT_AMBULATORY_CARE_PROVIDER_SITE_OTHER): Payer: Medicaid Other | Admitting: Obstetrics and Gynecology

## 2011-10-07 VITALS — BP 104/68 | Wt 157.0 lb

## 2011-10-07 DIAGNOSIS — Z331 Pregnant state, incidental: Secondary | ICD-10-CM

## 2011-10-07 LAB — POCT URINALYSIS DIPSTICK
Glucose, UA: NEGATIVE
Nitrite, UA: NEGATIVE
Urobilinogen, UA: NEGATIVE

## 2011-10-07 NOTE — Progress Notes (Signed)
Pt. Stated having some pressure / pt stated no other issues today

## 2011-10-07 NOTE — Progress Notes (Signed)
Addended by: Tim Lair on: 10/07/2011 11:06 AM   Modules accepted: Orders

## 2011-10-07 NOTE — Progress Notes (Signed)
Doing well.  Does not want a tubal ligation.  Plan a C-section.  Return to office in 4 weeks.  Anatomy ultrasound next visit.  Quad screen today. RPR today.  VBAC consent form given.

## 2011-10-10 LAB — AFP, QUAD SCREEN
AFP: 40.7 IU/mL
Curr Gest Age: 15 wks.days
Down Syndrome Scr Risk Est: 1:1280 {titer}
Interpretation-AFP: NEGATIVE
MoM for AFP: 1.42
MoM for INH: 3.15
uE3 Mom: 1.27
uE3 Value: 0.5 ng/mL

## 2011-10-11 ENCOUNTER — Telehealth: Payer: Self-pay | Admitting: Obstetrics and Gynecology

## 2011-10-11 LAB — T.PALLIDUM AB, TOTAL: T pallidum Antibodies (TP-PA): >8 S/CO — ABNORMAL HIGH (ref <0.90)

## 2011-10-11 NOTE — Telephone Encounter (Signed)
TC to MFM. Spoke with Dr Melene Muller who agrees with plan to follow titers q 4 weeks as long as titers remain stable.   If should increase would need referral to MFM or Infectious Disease.

## 2011-10-11 NOTE — Telephone Encounter (Signed)
Message copied by [name] on Tue Oct 11, 2011 11:05 AM ------      Message from: [name]      Created: Sun Oct 09, 2011 11:40 AM      Regarding: h/o Syphilis       Please contact MFM.  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  I rec titers q4wks with f/u according to results.  Question for MFM: (titers are stable at 1:4) Is there anything else they would rec and do they want to do an official consultation?  If yes, please sched.              Thank you,      [name]            ----- Message -----         From: [name], CNM         Sent: 09/05/2011   6:43 PM           To: [name], MD            See RPR T pallidum results

## 2011-10-24 ENCOUNTER — Telehealth: Payer: Self-pay | Admitting: Obstetrics and Gynecology

## 2011-10-24 NOTE — Telephone Encounter (Signed)
Spoke with pt Anita Fuentes msg pt c/o severe headaches with no relief from tylenol pt states no swelling occ blurred vision offered pt an appt for eval pt has appt 10/25/11 at 11:00 with [facility] for eval of headaches advised pt can alternate tylenol and ibuprofen and drink some caffeine may help with headache until app tomorrow advised pt if headaches get worse or any other changes call office pt voice understanding

## 2011-10-24 NOTE — Telephone Encounter (Signed)
Triage/ob/epic °

## 2011-10-25 ENCOUNTER — Encounter: Payer: Medicaid Other | Admitting: Obstetrics and Gynecology

## 2011-10-27 ENCOUNTER — Telehealth: Payer: Self-pay | Admitting: Obstetrics and Gynecology

## 2011-10-27 NOTE — Telephone Encounter (Signed)
TC to pt. LM to return call regarding message. 

## 2011-10-27 NOTE — Telephone Encounter (Signed)
Triage/epic/[facility]. °

## 2011-11-04 ENCOUNTER — Ambulatory Visit (INDEPENDENT_AMBULATORY_CARE_PROVIDER_SITE_OTHER): Payer: Medicaid Other

## 2011-11-04 ENCOUNTER — Other Ambulatory Visit: Payer: Self-pay | Admitting: Obstetrics and Gynecology

## 2011-11-04 VITALS — BP 100/72 | Wt 162.0 lb

## 2011-11-04 DIAGNOSIS — Z331 Pregnant state, incidental: Secondary | ICD-10-CM

## 2011-11-04 DIAGNOSIS — Z2089 Contact with and (suspected) exposure to other communicable diseases: Secondary | ICD-10-CM

## 2011-11-04 DIAGNOSIS — Z862 Personal history of diseases of the blood and blood-forming organs and certain disorders involving the immune mechanism: Secondary | ICD-10-CM

## 2011-11-04 DIAGNOSIS — Z202 Contact with and (suspected) exposure to infections with a predominantly sexual mode of transmission: Secondary | ICD-10-CM

## 2011-11-04 DIAGNOSIS — Z8619 Personal history of other infectious and parasitic diseases: Secondary | ICD-10-CM

## 2011-11-04 DIAGNOSIS — Z3689 Encounter for other specified antenatal screening: Secondary | ICD-10-CM

## 2011-11-04 HISTORY — DX: Contact with and (suspected) exposure to infections with a predominantly sexual mode of transmission: Z20.2

## 2011-11-04 HISTORY — DX: Personal history of diseases of the blood and blood-forming organs and certain disorders involving the immune mechanism: Z86.2

## 2011-11-04 LAB — US OB COMP + 14 WK

## 2011-11-04 NOTE — Progress Notes (Signed)
C/o of HA's no relief with Tylenol nor Ibuprofen

## 2011-11-04 NOTE — Progress Notes (Signed)
RPR today.  Rev'd neg Quad.  Pt didn't c/o of HA during exam, but if issues before NV, will refer to [facility] prn. Anatomy u/s today:  SIUP w/ size c/w dates (AUA=[redacted]w[redacted]d); EFW=11oz (89.3%); cx=3.05.  All anatomy seen & WNL.  Female. Anterior placenta.

## 2011-11-08 ENCOUNTER — Telehealth: Payer: Self-pay | Admitting: Obstetrics and Gynecology

## 2011-11-08 MED ORDER — PRENATAL MULTIVITAMIN CH: 1.0000 | ORAL_TABLET | Freq: Every day | ORAL | Status: DC

## 2011-11-08 NOTE — Telephone Encounter (Signed)
Triage/[facility]. [facility]. 

## 2011-11-09 ENCOUNTER — Telehealth: Payer: Self-pay | Admitting: Obstetrics and Gynecology

## 2011-11-09 NOTE — Telephone Encounter (Signed)
Tc to pt. Informed pt pnv's e-pres to pharm on 11/08/11 per Bonita Quin M,M. Pt voices understanding.

## 2011-11-09 NOTE — Telephone Encounter (Signed)
Triage/epic 

## 2011-12-02 ENCOUNTER — Ambulatory Visit (INDEPENDENT_AMBULATORY_CARE_PROVIDER_SITE_OTHER): Payer: Medicaid Other | Admitting: Obstetrics and Gynecology

## 2011-12-02 ENCOUNTER — Encounter: Payer: Self-pay | Admitting: Obstetrics and Gynecology

## 2011-12-02 ENCOUNTER — Other Ambulatory Visit: Payer: Medicaid Other

## 2011-12-02 VITALS — BP 104/62 | Wt 163.0 lb

## 2011-12-02 DIAGNOSIS — Z3480 Encounter for supervision of other normal pregnancy, unspecified trimester: Secondary | ICD-10-CM

## 2011-12-02 NOTE — Patient Instructions (Signed)
How a Baby Grows During Pregnancy Pregnancy begins when the female's sperm enters the female's egg. This happens in the fallopian tube and is called fertilization. The fertilized egg is called an embryo until it reaches 9 weeks from the time of fertilization. From 9 weeks until birth it is called a fetus. The fertilized egg moves down the tube into the uterus and attaches to the inside lining of the uterus.  The pregnant woman is responsible for the growth of the embryo/fetus by supplying nourishment and oxygen through the blood stream and placenta to the developing fetus. The uterus becomes larger and pops out from the abdomen more and more as the fetus develops and grows. A normal pregnancy lasts 280 days, with a range of 259 to 294 days, or 40 weeks. The pregnancy is divided up into three trimesters:  First trimester - 0 to 13 weeks.   Second trimester - 14 to 27 weeks.   Third trimester - 28 to 40 weeks.  The day your baby is supposed to be born is called estimated date of confinement [facility]) or estimated date of delivery (EDD). GROWTH OF THE BABY MONTH BY MONTH 1. First Month: The fertilized egg attaches to the inside of the uterus and certain cells will form the placenta and others will develop into the fetus. The arms, legs, brain, spinal cord, lungs, and heart begin to develop. At the end of the first month the heart begins to beat. The embryo weighs less than an ounce and is  inch long.  2. Second Month: The bones can be seen, the inner ear, eye lids, hands and feet form and genitals develop. By the end of 8 weeks, all of the major organs are developing. The fetus now weighs less than an ounce and is one inch (2.54 cm) long.  3. Third Month: Teeth buds appear, all the internal organs are forming, bones and muscles begin to grow, the spine can flex and the skin is transparent. Finger and toe nails begin to form, the hands develop faster than the feet and the arms are longer than the legs at this  point. The fetus weighs a little more than an ounce (0.03 kg) and is 3 inches (8.89cm) long.  4. Fourth Month: The placenta is completely formed. The external sex organs, neck, outer ear, eyebrows, eyelids and fingernails are formed. The fetus can hear, swallow, flex its arms and legs and the kidney begins to produce urine. The skin is covered with a white waxy coating (vernix) and very thin hair (lanugo) is present. The fetus weighs 5 ounces (0.14kg) and is 6 to 7 inches (16.51cm) long.  5. Fifth Month: The fetus moves around more and can be felt for the first time (called quickening), sleeps and wakes up at times, may begin to suck its finger and the nails grow to the end of the fingers. The gallbladder is now functioning and helps to digest the nutrients, eggs are formed in the female and the testicles begin to drop down from the abdomen to the scrotum in the female. The fetus weighs  to 1 pound (0.45kg) and is 10 inches (25.4cm) long.  6. Sixth Month: The lungs are formed but the fetus does not breath yet. The eyes open, the brain develops more quickly at this time, one can detect finger and toe prints and thicker hair grows. The fetus weighs 1 to 1 pounds (0.68kg) and is 12 inches (30.48cm) long.  7. Seventh Month: The fetus can hear and  respond to sounds, kicks and stretches and can sense changes in light. The fetus weighs 2 to 2 pounds (1.13kg) and is 14 inches (35.56cm) long.  8. Eight Month: All organs and body systems are fully developed and functioning. The bones get harder, taste buds develop and can taste sweet and sour flavors and the fetus may hiccup now. Different parts of the brain are developing and the skull remains soft for the brain to grow. The fetus weighs 5 pounds (2.27kg) and is 18 inches (45.75cm) long.  9. Ninth Month: The fetus gains about a half a pound a week, the lungs are fully developed, patterns of sleep develop and the head moves down into the bottom of the uterus called  vertex. If the buttocks moves into the bottom of the uterus, it is called a breech. The fetus weighs 6 to 9 pounds (2.72 to 4.08kg) and is 20 inches (50.8cm) long.  You should be informed about your pregnancy, yourself and how the baby is developing as much as possible. Being informed helps you to enjoy this experience. It also gives you the sense to feel if something is not going right and when to ask questions. Talk to your caregiver when you have questions about your baby or your own body. Document Released: 10/05/2007 Document Revised: 04/07/2011 Document Reviewed: 10/05/2007 [facility] Patient Information 2012 [place], [place].ABCs of Pregnancy A Antepartum care is very important. Be sure you see your doctor and get prenatal care as soon as you think you are pregnant. At this time, you will be tested for infection, genetic abnormalities and potential problems with you and the pregnancy. This is the time to discuss diet, exercise, work, medications, labor, pain medication during labor and the possibility of a cesarean delivery. Ask any questions that may concern you. It is important to see your doctor regularly throughout your pregnancy. Avoid exposure to toxic substances and chemicals - such as cleaning solvents, lead and mercury, some insecticides, and paint. Pregnant women should avoid exposure to paint fumes, and fumes that cause you to feel ill, dizzy or faint. When possible, it is a good idea to have a pre-pregnancy consultation with your caregiver to begin some important recommendations your caregiver suggests such as, taking folic acid, exercising, quitting smoking, avoiding alcoholic beverages, etc. B Breastfeeding is the healthiest choice for both you and your baby. It has many nutritional benefits for the baby and health benefits for the mother. It also creates a very tight and loving bond between the baby and mother. Talk to your doctor, your family and friends, and your employer about how you  choose to feed your baby and how they can support you in your decision. Not all birth defects can be prevented, but a woman can take actions that may increase her chance of having a healthy baby. Many birth defects happen very early in pregnancy, sometimes before a woman even knows she is pregnant. Birth defects or abnormalities of any child in your or the father's family should be discussed with your caregiver. Get a good support bra as your breast size changes. Wear it especially when you exercise and when nursing.  C Celebrate the news of your pregnancy with the your spouse/father and family. Childbirth classes are helpful to take for you and the spouse/father because it helps to understand what happens during the pregnancy, labor and delivery. Cesarean delivery should be discussed with your doctor so you are prepared for that possibility. The pros and cons of circumcision if it is  a boy, should be discussed with your pediatrician. Cigarette smoking during pregnancy can result in low birth weight babies. It has been associated with infertility, miscarriages, tubal pregnancies, infant death (mortality) and poor health (morbidity) in childhood. Additionally, cigarette smoking may cause long-term learning disabilities. If you smoke, you should try to quit before getting pregnant and not smoke during the pregnancy. Secondary smoke may also harm a mother and her developing baby. It is a good idea to ask people to stop smoking around you during your pregnancy and after the baby is born. Extra calcium is necessary when you are pregnant and is found in your prenatal vitamin, in dairy products, green leafy vegetables and in calcium supplements. D A healthy diet according to your current weight and height, along with vitamins and mineral supplements should be discussed with your caregiver. Domestic abuse or violence should be made known to your doctor right away to get the situation corrected. Drink more water when you  exercise to keep hydrated. Discomfort of your back and legs usually develops and progresses from the middle of the second trimester through to delivery of the baby. This is because of the enlarging baby and uterus, which may also affect your balance. Do not take illegal drugs. Illegal drugs can seriously harm the baby and you. Drink extra fluids (water is best) throughout pregnancy to help your body keep up with the increases in your blood volume. Drink at least 6 to 8 glasses of water, fruit juice, or milk each day. A good way to know you are drinking enough fluid is when your urine looks almost like clear water or is very light yellow.  E Eat healthy to get the nutrients you and your unborn baby need. Your meals should include the five basic food groups. Exercise (30 minutes of light to moderate exercise a day) is important and encouraged during pregnancy, if there are no medical problems or problems with the pregnancy. Exercise that causes discomfort or dizziness should be stopped and reported to your caregiver. Emotions during pregnancy can change from being ecstatic to depression and should be understood by you, your partner and your family. F Fetal screening with ultrasound, amniocentesis and monitoring during pregnancy and labor is common and sometimes necessary. Take 400 micrograms of folic acid daily both before, when possible, and during the first few months of pregnancy to reduce the risk of birth defects of the brain and spine. All women who could possibly become pregnant should take a vitamin with folic acid, every day. It is also important to eat a healthy diet with fortified foods (enriched grain products, including cereals, rice, breads, and pastas) and foods with natural sources of folate (orange juice, green leafy vegetables, beans, peanuts, broccoli, asparagus, peas, and lentils). The father should be involved with all aspects of the pregnancy including, the prenatal care, childbirth classes,  labor, delivery, and postpartum time. Fathers may also have emotional concerns about being a father, financial needs, and raising a family. G Genetic testing should be done appropriately. It is important to know your family and the father's history. If there have been problems with pregnancies or birth defects in your family, report these to your doctor. Also, genetic counselors can talk with you about the information you might need in making decisions about having a family. You can call a major medical center in your area for help in finding a board-certified genetic counselor. Genetic testing and counseling should be done before pregnancy when possible, especially if there is  a history of problems in the mother's or father's family. Certain ethnic backgrounds are more at risk for genetic defects. H Get familiar with the hospital where you will be having your baby. Get to know how long it takes to get there, the labor and delivery area, and the hospital procedures. Be sure your medical insurance is accepted there. Get your home ready for the baby including, clothes, the baby's room (when possible), furniture and car seat. Hand washing is important throughout the day, especially after handling raw meat and poultry, changing the baby's diaper or using the bathroom. This can help prevent the spread of many bacteria and viruses that cause infection. Your hair may become dry and thinner, but will return to normal a few weeks after the baby is born. Heartburn is a common problem that can be treated by taking antacids recommended by your caregiver, eating smaller meals 5 or 6 times a day, not drinking liquids when eating, drinking between meals and raising the head of your bed 2 to 3 inches. I Insurance to cover you, the baby, doctor and hospital should be reviewed so that you will be prepared to pay any costs not covered by your insurance plan. If you do not have medical insurance, there are usually clinics and  services available for you in your community. Take 30 milligrams of iron during your pregnancy as prescribed by your doctor to reduce the risk of low red blood cells (anemia) later in pregnancy. All women of childbearing age should eat a diet rich in iron. J There should be a joint effort for the mother, father and any other children to adapt to the pregnancy financially, emotionally, and psychologically during the pregnancy. Join a support group for moms-to-be. Or, join a class on parenting or childbirth. Have the family participate when possible. K Know your limits. Let your caregiver know if you experience any of the following:   Pain of any kind.   Strong cramps.   You develop a lot of weight in a short period of time (5 pounds in 3 to 5 days).   Vaginal bleeding, leaking of amniotic fluid.   Headache, vision problems.   Dizziness, fainting, shortness of breath.   Chest pain.   Fever of 102 F (38.9 C) or higher.   Gush of clear fluid from your vagina.   Painful urination.   Domestic violence.   Irregular heartbeat (palpitations).   Rapid beating of the heart (tachycardia).   Constant feeling sick to your stomach (nauseous) and vomiting.   Trouble walking, fluid retention (edema).   Muscle weakness.   If your baby has decreased activity.   Persistent diarrhea.   Abnormal vaginal discharge.   Uterine contractions at 20-minute intervals.   Back pain that travels down your leg.  L Learn and practice that what you eat and drink should be in moderation and healthy for you and your baby. Legal drugs such as alcohol and caffeine are important issues for pregnant women. There is no safe amount of alcohol a woman can drink while pregnant. Fetal alcohol syndrome, a disorder characterized by growth retardation, facial abnormalities, and central nervous system dysfunction, is caused by a woman's use of alcohol during pregnancy. Caffeine, found in tea, coffee, soft drinks and  chocolate, should also be limited. Be sure to read labels when trying to cut down on caffeine during pregnancy. More than 200 foods, beverages, and over-the-counter medications contain caffeine and have a high salt content! There are coffees and teas  that do not contain caffeine. M Medical conditions such as diabetes, epilepsy, and high blood pressure should be treated and kept under control before pregnancy when possible, but especially during pregnancy. Ask your caregiver about any medications that may need to be changed or adjusted during pregnancy. If you are currently taking any medications, ask your caregiver if it is safe to take them while you are pregnant or before getting pregnant when possible. Also, be sure to discuss any herbs or vitamins you are taking. They are medicines, too! Discuss with your doctor all medications, prescribed and over-the-counter, that you are taking. During your prenatal visit, discuss the medications your doctor may give you during labor and delivery. N Never be afraid to ask your doctor or caregiver questions about your health, the progress of the pregnancy, family problems, stressful situations, and recommendation for a pediatrician, if you do not have one. It is better to take all precautions and discuss any questions or concerns you may have during your office visits. It is a good idea to write down your questions before you visit the doctor. O Over-the-counter cough and cold remedies may contain alcohol or other ingredients that should be avoided during pregnancy. Ask your caregiver about prescription, herbs or over-the-counter medications that you are taking or may consider taking while pregnant.  P Physical activity during pregnancy can benefit both you and your baby by lessening discomfort and fatigue, providing a sense of well-being, and increasing the likelihood of early recovery after delivery. Light to moderate exercise during pregnancy strengthens the belly  (abdominal) and back muscles. This helps improve posture. Practicing yoga, walking, swimming, and cycling on a stationary bicycle are usually safe exercises for pregnant women. Avoid scuba diving, exercise at high altitudes (over 3000 feet), skiing, horseback riding, contact sports, etc. Always check with your doctor before beginning any kind of exercise, especially during pregnancy and especially if you did not exercise before getting pregnant. Q Queasiness, stomach upset and morning sickness are common during pregnancy. Eating a couple of crackers or dry toast before getting out of bed. Foods that you normally love may make you feel sick to your stomach. You may need to substitute other nutritious foods. Eating 5 or 6 small meals a day instead of 3 large ones may make you feel better. Do not drink with your meals, drink between meals. Questions that you have should be written down and asked during your prenatal visits. R Read about and make plans to baby-proof your home. There are important tips for making your home a safer environment for your baby. Review the tips and make your home safer for you and your baby. Read food labels regarding calories, salt and fat content in the food. S Saunas, hot tubs, and steam rooms should be avoided while you are pregnant. Excessive high heat may be harmful during your pregnancy. Your caregiver will screen and examine you for sexually transmitted diseases and genetic disorders during your prenatal visits. Learn the signs of labor. Sexual relations while pregnant is safe unless there is a medical or pregnancy problem and your caregiver advises against it. T Traveling long distances should be avoided especially in the third trimester of your pregnancy. If you do have to travel out of state, be sure to take a copy of your medical records and medical insurance plan with you. You should not travel long distances without seeing your doctor first. Most airlines will not allow  you to travel after 36 weeks of pregnancy. Toxoplasmosis is  an infection caused by a parasite that can seriously harm an unborn baby. Avoid eating undercooked meat and handling cat litter. Be sure to wear gloves when gardening. Tingling of the hands and fingers is not unusual and is due to fluid retention. This will go away after the baby is born. U Womb (uterus) size increases during the first trimester. Your kidneys will begin to function more efficiently. This may cause you to feel the need to urinate more often. You may also leak urine when sneezing, coughing or laughing. This is due to the growing uterus pressing against your bladder, which lies directly in front of and slightly under the uterus during the first few months of pregnancy. If you experience burning along with frequency of urination or bloody urine, be sure to tell your doctor. The size of your uterus in the third trimester may cause a problem with your balance. It is advisable to maintain good posture and avoid wearing high heels during this time. An ultrasound of your baby may be necessary during your pregnancy and is safe for you and your baby. V Vaccinations are an important concern for pregnant women. Get needed vaccines before pregnancy. Center for Disease Control ([employer]) has clear guidelines for the use of vaccines during pregnancy. Review the list, be sure to discuss it with your doctor. Prenatal vitamins are helpful and healthy for you and the baby. Do not take extra vitamins except what is recommended. Taking too much of certain vitamins can cause overdose problems. Continuous vomiting should be reported to your caregiver. Varicose veins may appear especially if there is a family history of varicose veins. They should subside after the delivery of the baby. Support hose helps if there is leg discomfort. W Being overweight or underweight during pregnancy may cause problems. Try to get within 15 pounds of your ideal weight  before pregnancy. Remember, pregnancy is not a time to be dieting! Do not stop eating or start skipping meals as your weight increases. Both you and your baby need the calories and nutrition you receive from a healthy diet. Be sure to consult with your doctor about your diet. There is a formula and diet plan available depending on whether you are overweight or underweight. Your caregiver or nutritionist can help and advise you if necessary. X Avoid X-rays. If you must have dental work or diagnostic tests, tell your dentist or physician that you are pregnant so that extra care can be taken. X-rays should only be taken when the risks of not taking them outweigh the risk of taking them. If needed, only the minimum amount of radiation should be used. When X-rays are necessary, protective lead shields should be used to cover areas of the body that are not being X-rayed. Y Your baby loves you. Breastfeeding your baby creates a loving and very close bond between the two of you. Give your baby a healthy environment to live in while you are pregnant. Infants and children require constant care and guidance. Their health and safety should be carefully watched at all times. After the baby is born, rest or take a nap when the baby is sleeping. Z Get your ZZZs. Be sure to get plenty of rest. Resting on your side as often as possible, especially on your left side is advised. It provides the best circulation to your baby and helps reduce swelling. Try taking a nap for 30 to 45 minutes in the afternoon when possible. After the baby is born rest or take a  nap when the baby is sleeping. Try elevating your feet for that amount of time when possible. It helps the circulation in your legs and helps reduce swelling.  Most information courtesy of the CDC. Document Released: 04/18/2005 Document Revised: 04/07/2011 Document Reviewed: 12/31/2008 [facility] Patient Information 2012 [place], [place].

## 2011-12-02 NOTE — Progress Notes (Signed)
Pt c/o pain in left leg and pain and pressure in vaginal area

## 2011-12-02 NOTE — Progress Notes (Signed)
Pt with left thigh pain.  Good ROM no swelling.  Offered PT pt declined cx L/CL glucola @[place]  Pt planning repeat c/s h/o c/s times three

## 2011-12-07 ENCOUNTER — Telehealth: Payer: Self-pay | Admitting: Obstetrics and Gynecology

## 2011-12-07 NOTE — Telephone Encounter (Signed)
TRIAGE/EPIC °

## 2011-12-07 NOTE — Telephone Encounter (Signed)
Spoke with pt rgd msg pt wants to know if she can get DNA testing while still pregnant advised pt only way to do that is by amniocentesis not aware of any physician who will do that procedure if not medically necessary most times they do it  at delivery by blood test pt voice understanding

## 2011-12-30 ENCOUNTER — Encounter: Payer: Medicaid Other | Admitting: Obstetrics and Gynecology

## 2011-12-30 ENCOUNTER — Ambulatory Visit (INDEPENDENT_AMBULATORY_CARE_PROVIDER_SITE_OTHER): Payer: Medicaid Other | Admitting: Obstetrics and Gynecology

## 2011-12-30 ENCOUNTER — Other Ambulatory Visit: Payer: Medicaid Other

## 2011-12-30 VITALS — BP 100/60 | Wt 171.0 lb

## 2011-12-30 DIAGNOSIS — Z3490 Encounter for supervision of normal pregnancy, unspecified, unspecified trimester: Secondary | ICD-10-CM

## 2011-12-30 DIAGNOSIS — Z331 Pregnant state, incidental: Secondary | ICD-10-CM

## 2011-12-30 LAB — CBC
HCT: 30.5 % — ABNORMAL LOW (ref 36.0–46.0)
Hemoglobin: 10.6 g/dL — ABNORMAL LOW (ref 12.0–15.0)
MCHC: 34.8 g/dL (ref 30.0–36.0)

## 2011-12-30 NOTE — Progress Notes (Signed)
[redacted]w[redacted]d Patient is complaining of Rt inguinal pain.  Examination of Hip joint with extension and abduction. No hernia. No negative finding. 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 No change vaginal secretions. 1 hr Glucola today. ROB x 2 weeks

## 2011-12-31 LAB — RPR: RPR Ser Ql: REACTIVE — AB

## 2012-01-03 LAB — GLUCOSE TOLERANCE, 1 HOUR (50G) W/O FASTING: Glucose, 1 Hour GTT: 126 mg/dL (ref 70–140)

## 2012-01-03 LAB — T.PALLIDUM AB, TOTAL: T pallidum Antibodies (TP-PA): >8 S/CO — ABNORMAL HIGH (ref <0.90)

## 2012-01-13 ENCOUNTER — Ambulatory Visit (INDEPENDENT_AMBULATORY_CARE_PROVIDER_SITE_OTHER): Payer: Medicaid Other | Admitting: Obstetrics and Gynecology

## 2012-01-13 ENCOUNTER — Encounter: Payer: Self-pay | Admitting: Obstetrics and Gynecology

## 2012-01-13 VITALS — BP 116/74 | Wt 174.0 lb

## 2012-01-13 DIAGNOSIS — Z331 Pregnant state, incidental: Secondary | ICD-10-CM

## 2012-01-13 MED ORDER — CYCLOBENZAPRINE HCL 5 MG PO TABS: 5.0000 mg | ORAL_TABLET | Freq: Three times a day (TID) | ORAL | Status: AC | PRN

## 2012-01-13 NOTE — Patient Instructions (Signed)
Fetal Movement Counts Patient Name: __________________________________________________ Patient Due Date: ____________________ Kick counts is highly recommended in high risk pregnancies, but it is a good idea for every pregnant woman to do. Start counting fetal movements at 28 weeks of the pregnancy. Fetal movements increase after eating a full meal or eating or drinking something sweet (the blood sugar is higher). It is also important to drink plenty of fluids (well hydrated) before doing the count. Lie on your left side because it helps with the circulation or you can sit in a comfortable chair with your arms over your belly (abdomen) with no distractions around you. DOING THE COUNT  Try to do the count the same time of day each time you do it.   Mark the day and time, then see how long it takes for you to feel 10 movements (kicks, flutters, swishes, rolls). You should have at least 10 movements within 2 hours. You will most likely feel 10 movements in much less than 2 hours. If you do not, wait an hour and count again. After a couple of days you will see a pattern.   What you are looking for is a change in the pattern or not enough counts in 2 hours. Is it taking longer in time to reach 10 movements?  SEEK MEDICAL CARE IF:  You feel less than 10 counts in 2 hours. Tried twice.   No movement in one hour.   The pattern is changing or taking longer each day to reach 10 counts in 2 hours.   You feel the baby is not moving as it usually does.  Date: ____________ Movements: ____________ Start time: ____________ Finish time: ____________  Date: ____________ Movements: ____________ Start time: ____________ Finish time: ____________ Date: ____________ Movements: ____________ Start time: ____________ Finish time: ____________ Date: ____________ Movements: ____________ Start time: ____________ Finish time: ____________ Date: ____________ Movements: ____________ Start time: ____________ Finish time:  ____________ Date: ____________ Movements: ____________ Start time: ____________ Finish time: ____________ Date: ____________ Movements: ____________ Start time: ____________ Finish time: ____________ Date: ____________ Movements: ____________ Start time: ____________ Finish time: ____________  Date: ____________ Movements: ____________ Start time: ____________ Finish time: ____________ Date: ____________ Movements: ____________ Start time: ____________ Finish time: ____________ Date: ____________ Movements: ____________ Start time: ____________ Finish time: ____________ Date: ____________ Movements: ____________ Start time: ____________ Finish time: ____________ Date: ____________ Movements: ____________ Start time: ____________ Finish time: ____________ Date: ____________ Movements: ____________ Start time: ____________ Finish time: ____________ Date: ____________ Movements: ____________ Start time: ____________ Finish time: ____________  Date: ____________ Movements: ____________ Start time: ____________ Finish time: ____________ Date: ____________ Movements: ____________ Start time: ____________ Finish time: ____________ Date: ____________ Movements: ____________ Start time: ____________ Finish time: ____________ Date: ____________ Movements: ____________ Start time: ____________ Finish time: ____________ Date: ____________ Movements: ____________ Start time: ____________ Finish time: ____________ Date: ____________ Movements: ____________ Start time: ____________ Finish time: ____________ Date: ____________ Movements: ____________ Start time: ____________ Finish time: ____________  Date: ____________ Movements: ____________ Start time: ____________ Finish time: ____________ Date: ____________ Movements: ____________ Start time: ____________ Finish time: ____________ Date: ____________ Movements: ____________ Start time: ____________ Finish time: ____________ Date: ____________ Movements:  ____________ Start time: ____________ Finish time: ____________ Date: ____________ Movements: ____________ Start time: ____________ Finish time: ____________ Date: ____________ Movements: ____________ Start time: ____________ Finish time: ____________ Date: ____________ Movements: ____________ Start time: ____________ Finish time: ____________  Date: ____________ Movements: ____________ Start time: ____________ Finish time: ____________ Date: ____________ Movements: ____________ Start time: ____________ Finish time: ____________ Date: ____________ Movements: ____________ Start time:   ____________ Finish time: ____________ Date: ____________ Movements: ____________ Start time: ____________ Finish time: ____________ Date: ____________ Movements: ____________ Start time: ____________ Finish time: ____________ Date: ____________ Movements: ____________ Start time: ____________ Finish time: ____________ Date: ____________ Movements: ____________ Start time: ____________ Finish time: ____________  Date: ____________ Movements: ____________ Start time: ____________ Finish time: ____________ Date: ____________ Movements: ____________ Start time: ____________ Finish time: ____________ Date: ____________ Movements: ____________ Start time: ____________ Finish time: ____________ Date: ____________ Movements: ____________ Start time: ____________ Finish time: ____________ Date: ____________ Movements: ____________ Start time: ____________ Finish time: ____________ Date: ____________ Movements: ____________ Start time: ____________ Finish time: ____________ Date: ____________ Movements: ____________ Start time: ____________ Finish time: ____________  Date: ____________ Movements: ____________ Start time: ____________ Finish time: ____________ Date: ____________ Movements: ____________ Start time: ____________ Finish time: ____________ Date: ____________ Movements: ____________ Start time: ____________ Finish  time: ____________ Date: ____________ Movements: ____________ Start time: ____________ Finish time: ____________ Date: ____________ Movements: ____________ Start time: ____________ Finish time: ____________ Date: ____________ Movements: ____________ Start time: ____________ Finish time: ____________ Date: ____________ Movements: ____________ Start time: ____________ Finish time: ____________  Date: ____________ Movements: ____________ Start time: ____________ Finish time: ____________ Date: ____________ Movements: ____________ Start time: ____________ Finish time: ____________ Date: ____________ Movements: ____________ Start time: ____________ Finish time: ____________ Date: ____________ Movements: ____________ Start time: ____________ Finish time: ____________ Date: ____________ Movements: ____________ Start time: ____________ Finish time: ____________ Date: ____________ Movements: ____________ Start time: ____________ Finish time: ____________ Document Released: 05/18/2006 Document Revised: 04/07/2011 Document Reviewed: 11/18/2008 Exit[employer] Patient Information 2012 [employer]. 

## 2012-01-13 NOTE — Progress Notes (Signed)
Pt c/o left leg and vaginal pain. Pt has tried warm baths and tylenol.

## 2012-01-13 NOTE — Progress Notes (Signed)
No hernia palpated in left groin.  No calf tenderness or swelling noted cx LCP Pt has no relief with tylenol or warm compresses Will try flexeril.  FKC reviewed

## 2012-01-26 ENCOUNTER — Telehealth: Payer: Self-pay | Admitting: Obstetrics and Gynecology

## 2012-01-26 NOTE — Telephone Encounter (Signed)
Repeat C/S scheduled for 03/23/12 @ 3:00 with AVS.  -Adrianne Pridgen

## 2012-01-27 ENCOUNTER — Other Ambulatory Visit: Payer: Self-pay | Admitting: Obstetrics and Gynecology

## 2012-01-27 ENCOUNTER — Encounter: Payer: Self-pay | Admitting: Obstetrics and Gynecology

## 2012-01-27 ENCOUNTER — Ambulatory Visit (INDEPENDENT_AMBULATORY_CARE_PROVIDER_SITE_OTHER): Payer: Medicaid Other | Admitting: Obstetrics and Gynecology

## 2012-01-27 VITALS — Wt 177.0 lb

## 2012-01-27 DIAGNOSIS — Z331 Pregnant state, incidental: Secondary | ICD-10-CM

## 2012-01-27 NOTE — Progress Notes (Signed)
[redacted]w[redacted]d GFM  Some pressure No contractions No LOF  No Bleeding Scheduled c/s 03/23/12  With Dr AVS

## 2012-01-27 NOTE — Progress Notes (Signed)
[redacted]w[redacted]d

## 2012-02-08 ENCOUNTER — Encounter: Payer: Self-pay | Admitting: Obstetrics and Gynecology

## 2012-02-08 ENCOUNTER — Ambulatory Visit (INDEPENDENT_AMBULATORY_CARE_PROVIDER_SITE_OTHER): Payer: Medicaid Other | Admitting: Obstetrics and Gynecology

## 2012-02-08 VITALS — BP 116/64 | Wt 181.5 lb

## 2012-02-08 DIAGNOSIS — Z331 Pregnant state, incidental: Secondary | ICD-10-CM

## 2012-02-08 DIAGNOSIS — Z23 Encounter for immunization: Secondary | ICD-10-CM

## 2012-02-08 NOTE — Progress Notes (Signed)
[redacted]w[redacted]d The cesarean section is scheduled.  The patient does not want a tubal ligation.  Plans Depo-Provera for contraception. Return office in 2 weeks. Flu shot today. Dr. Stefano Gaul

## 2012-02-08 NOTE — Progress Notes (Signed)
Pt w/o complaint today, would like to know estimate of how much the baby weighs.

## 2012-02-08 NOTE — Addendum Note (Signed)
Addended by: Tim Lair on: 02/08/2012 12:31 PM   Modules accepted: Orders

## 2012-02-22 ENCOUNTER — Encounter: Payer: Self-pay | Admitting: Obstetrics and Gynecology

## 2012-02-22 ENCOUNTER — Ambulatory Visit (INDEPENDENT_AMBULATORY_CARE_PROVIDER_SITE_OTHER): Payer: Medicaid Other | Admitting: Obstetrics and Gynecology

## 2012-02-22 VITALS — BP 110/72 | Wt 183.0 lb

## 2012-02-22 DIAGNOSIS — Z331 Pregnant state, incidental: Secondary | ICD-10-CM

## 2012-02-22 LAB — RPR TITER: RPR Titer: 1:4 {titer}

## 2012-02-22 NOTE — Progress Notes (Signed)
[redacted]w[redacted]d  Pt unable to give urine sample at this time; pt given water.  Pt received flu vaccine 02-08-12. Pt has no concerns today.

## 2012-02-22 NOTE — Addendum Note (Signed)
Addended by: Larwance Rote on: 02/22/2012 12:54 PM   Modules accepted: Orders

## 2012-02-22 NOTE — Progress Notes (Signed)
[redacted]w[redacted]d RPR was NR 12/2011: will repeat today Right sciatica and some USI

## 2012-02-27 ENCOUNTER — Encounter: Payer: Self-pay | Admitting: Obstetrics and Gynecology

## 2012-02-29 ENCOUNTER — Encounter: Payer: Self-pay | Admitting: Obstetrics and Gynecology

## 2012-02-29 ENCOUNTER — Ambulatory Visit (INDEPENDENT_AMBULATORY_CARE_PROVIDER_SITE_OTHER): Payer: Medicaid Other | Admitting: Obstetrics and Gynecology

## 2012-02-29 VITALS — BP 112/70 | Wt 185.0 lb

## 2012-02-29 DIAGNOSIS — Z98891 History of uterine scar from previous surgery: Secondary | ICD-10-CM

## 2012-02-29 DIAGNOSIS — Z9889 Other specified postprocedural states: Secondary | ICD-10-CM

## 2012-02-29 NOTE — Progress Notes (Addendum)
[redacted]w[redacted]d C/s sched 11/22 RPR reactive again at last visit  Pt denies any new exposures but can't be certain partner is monogamous Will determine whether she needs to be retreated and consult ID but it is likely a false neg Dr. Judyann Munson of ID was consulted and she reviewed pt's labs and this is what she had to say, "I was called by your nurse yetserday regarding Anita Fuentes. based on epic data it looks like Anita Fuentes was previously diagnosed with syphilis in 2008, txd at that time with pcn 2.37M x 1, and subsequently has had low level titers since then. upon her 2013 pregnancy, her titer in june 1:4, july NR, aug 1:4, oct 1:4. I think the july testing was likely false negative, just a dilution problem. Since it has not increased by 2 folds/dilutions, she does not need to be retreated, (as long as we have documentation that she has been treated)  hope this helps. let me know if you have other questions.  cynthia [phone])"

## 2012-03-08 ENCOUNTER — Encounter: Payer: Self-pay | Admitting: Obstetrics and Gynecology

## 2012-03-08 ENCOUNTER — Telehealth: Payer: Self-pay | Admitting: Obstetrics and Gynecology

## 2012-03-08 ENCOUNTER — Ambulatory Visit (INDEPENDENT_AMBULATORY_CARE_PROVIDER_SITE_OTHER): Payer: Medicaid Other | Admitting: Obstetrics and Gynecology

## 2012-03-08 VITALS — BP 122/70 | Wt 185.0 lb

## 2012-03-08 DIAGNOSIS — Z9889 Other specified postprocedural states: Secondary | ICD-10-CM

## 2012-03-08 DIAGNOSIS — Z331 Pregnant state, incidental: Secondary | ICD-10-CM

## 2012-03-08 DIAGNOSIS — Z98891 History of uterine scar from previous surgery: Secondary | ICD-10-CM

## 2012-03-08 NOTE — Progress Notes (Signed)
[redacted]w[redacted]d Desires cx check. GBS done. Per ID consultant, no treatment for 01/2012 titer.

## 2012-03-08 NOTE — Telephone Encounter (Signed)
Repeat C/S rescheduled to 03/23/12 @ 11:15 with AVS.  -Adrianne Pridgen

## 2012-03-08 NOTE — Addendum Note (Signed)
Addended by: Lerry Liner D on: 03/08/2012 03:34 PM   Modules accepted: Orders

## 2012-03-09 ENCOUNTER — Encounter (HOSPITAL_COMMUNITY): Payer: Self-pay | Admitting: Pharmacist

## 2012-03-13 ENCOUNTER — Encounter: Payer: Self-pay | Admitting: Obstetrics and Gynecology

## 2012-03-13 ENCOUNTER — Ambulatory Visit (INDEPENDENT_AMBULATORY_CARE_PROVIDER_SITE_OTHER): Payer: Medicaid Other | Admitting: Obstetrics and Gynecology

## 2012-03-13 VITALS — BP 110/70 | Wt 185.0 lb

## 2012-03-13 DIAGNOSIS — Z98891 History of uterine scar from previous surgery: Secondary | ICD-10-CM

## 2012-03-13 DIAGNOSIS — Z9889 Other specified postprocedural states: Secondary | ICD-10-CM

## 2012-03-13 NOTE — Progress Notes (Signed)
[redacted]w[redacted]d Discussed false neg RPR with pt and recs per Dr. Drue Second (see previous note from 10/30) no treatment needed [facility] Repeat c/s scheduled for next Fri Plans in office circ Plans depo provera in hosp RTO 1wk

## 2012-03-16 ENCOUNTER — Encounter (HOSPITAL_COMMUNITY): Payer: Self-pay

## 2012-03-19 ENCOUNTER — Encounter (HOSPITAL_COMMUNITY): Payer: Self-pay

## 2012-03-19 ENCOUNTER — Encounter (HOSPITAL_COMMUNITY)
Admission: RE | Admit: 2012-03-19 | Discharge: 2012-03-19 | Disposition: A | Discharge status: Home / Self Care | Payer: Medicaid Other | Source: Ambulatory Visit | Attending: Obstetrics and Gynecology | Admitting: Obstetrics and Gynecology

## 2012-03-19 LAB — CBC
HCT: 34.7 % — ABNORMAL LOW (ref 36.0–46.0)
Hemoglobin: 12 g/dL (ref 12.0–15.0)
MCHC: 34.6 g/dL (ref 30.0–36.0)
RBC: 3.48 MIL/uL — ABNORMAL LOW (ref 3.87–5.11)
WBC: 11.4 10*3/uL — ABNORMAL HIGH (ref 4.0–10.5)

## 2012-03-19 LAB — ABO/RH: ABO/RH(D): O POS

## 2012-03-19 LAB — SURGICAL PCR SCREEN: Staphylococcus aureus: NEGATIVE

## 2012-03-19 LAB — TYPE AND SCREEN
ABO/RH(D): O POS
Antibody Screen: NEGATIVE

## 2012-03-19 NOTE — Patient Instructions (Addendum)
   Your procedure is scheduled on:  Friday, Nov 22 at 1115am  Enter through the Main Entrance of [facility] at:  945am Pick up the phone at the desk and dial [phone] and inform us of your arrival.  Please call this number if you have any problems the morning of surgery: [phone]  Remember: Do not eat food after midnight: Thursday Do not drink clear liquids after: midnight Thursday Take these medicines the morning of surgery with a SIP OF WATER:  None  Do not wear jewelry, make-up, or FINGER nail polish No metal in your hair or on your body. Do not wear lotions, powders, perfumes. You may wear deodorant.  Please use your CHG wash as directed prior to surgery.  Do not shave anywhere for at least 12 hours prior to first CHG shower.  Do not bring valuables to the hospital. Contacts, dentures or bridgework may not be worn into surgery.  Leave suitcase in the car. After Surgery it may be brought to your room. For patients being admitted to the hospital, checkout time is 11:00am the day of discharge.  Patient to arrange for a ride home prior to discharge from hospital.

## 2012-03-20 ENCOUNTER — Ambulatory Visit (INDEPENDENT_AMBULATORY_CARE_PROVIDER_SITE_OTHER): Payer: Medicaid Other | Admitting: Obstetrics and Gynecology

## 2012-03-20 VITALS — BP 118/74 | Wt 189.0 lb

## 2012-03-20 DIAGNOSIS — Z331 Pregnant state, incidental: Secondary | ICD-10-CM

## 2012-03-20 DIAGNOSIS — Z9889 Other specified postprocedural states: Secondary | ICD-10-CM

## 2012-03-20 DIAGNOSIS — Z98891 History of uterine scar from previous surgery: Secondary | ICD-10-CM

## 2012-03-20 NOTE — Progress Notes (Signed)
[redacted]w[redacted]d Desires cx check. For c/s on 03/23/12

## 2012-03-22 ENCOUNTER — Telehealth: Payer: Self-pay | Admitting: Obstetrics and Gynecology

## 2012-03-22 NOTE — H&P (Signed)
Admission History and Physical Exam for an Obstetrics Patient  [name] is a 31 y.o. female, [phone], at [redacted] weeks gestation, who presents for repeat cesarean section.  She does not want a tubal ligation.. She has been followed at the [facility] and Gynecology division of [facility].  Her pregnancy has been complicated by prior cesarean sections.  She was treated for syphilis in 2003.  Her most recent titers have been very low.  Her most recent RPR is negative.  Her beta strep culture was positive. See history below.  OB History    Grav Para Term Preterm Abortions TAB SAB Ect Mult Living   4 3 3  0 0 0 0 0 0 3      Past Medical History  Diagnosis Date  . Migraines     TYPICALLY TAKES EXCEDERIN MIGRAINE  . History of chlamydia 1998  . History of gonorrhea   . Hx of syphilis 1998  . H/O candidiasis   . Hx: UTI (urinary tract infection)   . Yeast infection   . Abnormal Pap smear     Medication was treatment  . Exposure to STD 11/04/2011  . History of anemia 11/04/2011  . Anemia     Hx -during Second Pregnancy    No prescriptions prior to admission    Past Surgical History  Procedure Date  . Cesarean section     No Known Allergies  Family History: family history includes Diabetes in her father, maternal grandmother, mother, paternal grandmother, and sister; Heart disease in her maternal grandmother and mother; Hypertension in her father, maternal grandmother, mother, and sister; and Thyroid disease in her maternal uncle.  Social History:  reports that she has been smoking Cigarettes.  She has a 15 pack-year smoking history. She has never used smokeless tobacco. She reports that she drinks alcohol. She reports that she uses illicit drugs (Marijuana) about 4 times per week.  Review of systems: Normal pregnancy complaints.  Admission Physical Exam:    Body mass index is 32.42 kg/(m^2).  Height 5\' 3"  (1.6 m), weight 183 lb (83.008 kg),  last menstrual period 06/24/2011.  HEENT:                 Within normal limits Chest:                   Clear Heart:                    Regular rate and rhythm Abdomen:             Gravid and nontender Extremities:          Grossly normal Neurologic exam: Grossly normal Pelvic exam:         Cervix: closed  Prenatal labs: ABO, Rh:             --/--/O POS (11/18 1415) Antibody:              NEG (11/18 1409) Rubella:                 immune RPR:                    NON REACTIVE (11/18 1410)  HBsAg:                 NEGATIVE (04/17 1143)  HIV:  NON REACTIVE (04/17 1143)  GBS:                     POSITIVE (11/07 1536)   CBC    Component Value Date/Time   WBC 11.4* 03/19/2012 1410   RBC 3.48* 03/19/2012 1410   HGB 12.0 03/19/2012 1410   HCT 34.7* 03/19/2012 1410   PLT 137* 03/19/2012 1410   MCV 99.7 03/19/2012 1410   MCH 34.5* 03/19/2012 1410   MCHC 34.6 03/19/2012 1410   RDW 14.7 03/19/2012 1410   LYMPHSABS 3.3 08/17/2011 1143   MONOABS 0.5 08/17/2011 1143   EOSABS 0.1 08/17/2011 1143   BASOSABS 0.0 08/17/2011 1143      Assessment:  [redacted]w[redacted]d gestation  Prior cesarean section  Desires repeat cesarean section  Prior history of history of syphilis that was appropriately treated.  Most recent RPR negative.  Cigarette smoker.  Positive beta strep.  Anemia   Plan:  Repeat cesarean section.  Risks and benefits reviewed.   Janine Limbo 03/22/2012, 7:41 PM

## 2012-03-23 ENCOUNTER — Encounter (HOSPITAL_COMMUNITY): Payer: Self-pay | Admitting: *Deleted

## 2012-03-23 ENCOUNTER — Encounter (HOSPITAL_COMMUNITY)
Admission: AD | Disposition: A | Discharge status: Home / Self Care | Payer: Self-pay | Source: Ambulatory Visit | Attending: Obstetrics and Gynecology

## 2012-03-23 ENCOUNTER — Encounter (HOSPITAL_COMMUNITY): Payer: Self-pay | Admitting: Anesthesiology

## 2012-03-23 ENCOUNTER — Inpatient Hospital Stay (HOSPITAL_COMMUNITY)
Admission: AD | Admit: 2012-03-23 | Discharge: 2012-03-25 | DRG: 766 | Disposition: A | Discharge status: Home / Self Care | Payer: Medicaid Other | Source: Ambulatory Visit | Attending: Obstetrics and Gynecology | Admitting: Obstetrics and Gynecology

## 2012-03-23 ENCOUNTER — Inpatient Hospital Stay (HOSPITAL_COMMUNITY): Payer: Medicaid Other

## 2012-03-23 ENCOUNTER — Encounter (HOSPITAL_COMMUNITY): Payer: Self-pay

## 2012-03-23 DIAGNOSIS — Z98891 History of uterine scar from previous surgery: Secondary | ICD-10-CM | POA: Diagnosis present

## 2012-03-23 DIAGNOSIS — O99892 Other specified diseases and conditions complicating childbirth: Secondary | ICD-10-CM | POA: Diagnosis present

## 2012-03-23 DIAGNOSIS — O34219 Maternal care for unspecified type scar from previous cesarean delivery: Principal | ICD-10-CM | POA: Diagnosis present

## 2012-03-23 DIAGNOSIS — Z22330 Carrier of Group B streptococcus: Secondary | ICD-10-CM

## 2012-03-23 DIAGNOSIS — Z01818 Encounter for other preprocedural examination: Secondary | ICD-10-CM

## 2012-03-23 DIAGNOSIS — D649 Anemia, unspecified: Secondary | ICD-10-CM | POA: Diagnosis present

## 2012-03-23 DIAGNOSIS — O99334 Smoking (tobacco) complicating childbirth: Secondary | ICD-10-CM | POA: Diagnosis present

## 2012-03-23 DIAGNOSIS — O9902 Anemia complicating childbirth: Secondary | ICD-10-CM | POA: Diagnosis present

## 2012-03-23 DIAGNOSIS — O98119 Syphilis complicating pregnancy, unspecified trimester: Secondary | ICD-10-CM | POA: Diagnosis present

## 2012-03-23 DIAGNOSIS — Z01812 Encounter for preprocedural laboratory examination: Secondary | ICD-10-CM

## 2012-03-23 LAB — CBC
HCT: 35.4 % — ABNORMAL LOW (ref 36.0–46.0)
Hemoglobin: 12.4 g/dL (ref 12.0–15.0)
MCHC: 35 g/dL (ref 30.0–36.0)
RBC: 3.55 MIL/uL — ABNORMAL LOW (ref 3.87–5.11)
WBC: 10.1 10*3/uL (ref 4.0–10.5)

## 2012-03-23 SURGERY — Surgical Case
Anesthesia: Spinal | Site: Abdomen | Wound class: Clean Contaminated

## 2012-03-23 MED ORDER — MORPHINE SULFATE 0.5 MG/ML IJ SOLN
INTRAMUSCULAR | Status: AC
  Filled 2012-03-23: qty 10

## 2012-03-23 MED ORDER — BUPIVACAINE-EPINEPHRINE (PF) 0.5% -1:200000 IJ SOLN
INTRAMUSCULAR | Status: AC
  Filled 2012-03-23: qty 10

## 2012-03-23 MED ORDER — CEFAZOLIN SODIUM-DEXTROSE 2-3 GM-% IV SOLR
2.0000 g | INTRAVENOUS | Status: AC
  Administered 2012-03-23: 2 g via INTRAVENOUS

## 2012-03-23 MED ORDER — MORPHINE SULFATE (PF) 0.5 MG/ML IJ SOLN
INTRAMUSCULAR | Status: DC | PRN
  Administered 2012-03-23: .35 mg via INTRAVENOUS
  Administered 2012-03-23: .5 mg via INTRAVENOUS
  Administered 2012-03-23: .15 mg via INTRATHECAL

## 2012-03-23 MED ORDER — NALBUPHINE HCL 10 MG/ML IJ SOLN
5.0000 mg | INTRAMUSCULAR | Status: DC | PRN
  Filled 2012-03-23 (×2): qty 1

## 2012-03-23 MED ORDER — SCOPOLAMINE 1 MG/3DAYS TD PT72
MEDICATED_PATCH | TRANSDERMAL | Status: AC
  Administered 2012-03-23: 1.5 mg via TRANSDERMAL
  Filled 2012-03-23: qty 1

## 2012-03-23 MED ORDER — FENTANYL CITRATE 0.05 MG/ML IJ SOLN
25.0000 ug | INTRAMUSCULAR | Status: DC | PRN
  Administered 2012-03-23: 50 ug via INTRAVENOUS

## 2012-03-23 MED ORDER — OXYTOCIN 10 UNIT/ML IJ SOLN
INTRAMUSCULAR | Status: AC
  Filled 2012-03-23: qty 4

## 2012-03-23 MED ORDER — MEPERIDINE HCL 25 MG/ML IJ SOLN: 6.2500 mg | INTRAMUSCULAR | Status: DC | PRN

## 2012-03-23 MED ORDER — SIMETHICONE 80 MG PO CHEW: 80.0000 mg | CHEWABLE_TABLET | ORAL | Status: DC | PRN

## 2012-03-23 MED ORDER — MEDROXYPROGESTERONE ACETATE 150 MG/ML IM SUSP
150.0000 mg | INTRAMUSCULAR | Status: AC | PRN
  Administered 2012-03-25: 150 mg via INTRAMUSCULAR
  Filled 2012-03-23: qty 1

## 2012-03-23 MED ORDER — EPHEDRINE 5 MG/ML INJ
INTRAVENOUS | Status: AC
  Filled 2012-03-23: qty 10

## 2012-03-23 MED ORDER — CEFAZOLIN SODIUM-DEXTROSE 2-3 GM-% IV SOLR
INTRAVENOUS | Status: AC
  Filled 2012-03-23: qty 50

## 2012-03-23 MED ORDER — OXYCODONE-ACETAMINOPHEN 5-325 MG PO TABS
1.0000 | ORAL_TABLET | ORAL | Status: DC | PRN
  Administered 2012-03-24: 1 via ORAL
  Administered 2012-03-24: 2 via ORAL
  Administered 2012-03-24 (×2): 1 via ORAL
  Administered 2012-03-25: 2 via ORAL
  Administered 2012-03-25: 1 via ORAL
  Filled 2012-03-23 (×4): qty 1
  Filled 2012-03-23 (×2): qty 2

## 2012-03-23 MED ORDER — EPHEDRINE SULFATE 50 MG/ML IJ SOLN
INTRAMUSCULAR | Status: DC | PRN
  Administered 2012-03-23: 5 mg via INTRAVENOUS
  Administered 2012-03-23: 10 mg via INTRAVENOUS

## 2012-03-23 MED ORDER — DIPHENHYDRAMINE HCL 50 MG/ML IJ SOLN: 25.0000 mg | INTRAMUSCULAR | Status: DC | PRN

## 2012-03-23 MED ORDER — BUPIVACAINE-EPINEPHRINE 0.5% -1:200000 IJ SOLN
INTRAMUSCULAR | Status: DC | PRN
  Administered 2012-03-23: 10 mL

## 2012-03-23 MED ORDER — GLYCOPYRROLATE 0.2 MG/ML IJ SOLN
INTRAMUSCULAR | Status: DC | PRN
  Administered 2012-03-23: 0.2 mg via INTRAVENOUS

## 2012-03-23 MED ORDER — SODIUM CHLORIDE 0.9 % IJ SOLN: 3.0000 mL | INTRAMUSCULAR | Status: DC | PRN

## 2012-03-23 MED ORDER — FENTANYL CITRATE 0.05 MG/ML IJ SOLN
INTRAMUSCULAR | Status: DC | PRN
  Administered 2012-03-23: 25 ug via INTRATHECAL

## 2012-03-23 MED ORDER — IBUPROFEN 600 MG PO TABS
600.0000 mg | ORAL_TABLET | Freq: Four times a day (QID) | ORAL | Status: DC
  Administered 2012-03-24 – 2012-03-25 (×6): 600 mg via ORAL
  Filled 2012-03-23 (×6): qty 1

## 2012-03-23 MED ORDER — SENNOSIDES-DOCUSATE SODIUM 8.6-50 MG PO TABS
2.0000 | ORAL_TABLET | Freq: Every day | ORAL | Status: DC
  Administered 2012-03-24: 2 via ORAL

## 2012-03-23 MED ORDER — PRENATAL MULTIVITAMIN CH
1.0000 | ORAL_TABLET | Freq: Every day | ORAL | Status: DC
  Administered 2012-03-24 – 2012-03-25 (×2): 1 via ORAL
  Filled 2012-03-23 (×2): qty 1

## 2012-03-23 MED ORDER — BUPIVACAINE-EPINEPHRINE PF 0.25-1:200000 % IJ SOLN
INTRAMUSCULAR | Status: AC
  Filled 2012-03-23: qty 30

## 2012-03-23 MED ORDER — OXYTOCIN 10 UNIT/ML IJ SOLN
INTRAMUSCULAR | Status: DC | PRN
  Administered 2012-03-23: 40 [IU] via INTRAMUSCULAR

## 2012-03-23 MED ORDER — MENTHOL 3 MG MT LOZG: 1.0000 | LOZENGE | OROMUCOSAL | Status: DC | PRN

## 2012-03-23 MED ORDER — ONDANSETRON HCL 4 MG/2ML IJ SOLN: 4.0000 mg | Freq: Three times a day (TID) | INTRAMUSCULAR | Status: DC | PRN

## 2012-03-23 MED ORDER — 0.9 % SODIUM CHLORIDE (POUR BTL) OPTIME
TOPICAL | Status: DC | PRN
  Administered 2012-03-23: 1000 mL

## 2012-03-23 MED ORDER — NALBUPHINE SYRINGE 5 MG/0.5 ML
INJECTION | INTRAMUSCULAR | Status: AC
  Filled 2012-03-23: qty 0.5

## 2012-03-23 MED ORDER — BUPIVACAINE IN DEXTROSE 0.75-8.25 % IT SOLN
INTRATHECAL | Status: DC | PRN
  Administered 2012-03-23: 1.4 mL via INTRATHECAL

## 2012-03-23 MED ORDER — DIPHENHYDRAMINE HCL 25 MG PO CAPS: 25.0000 mg | ORAL_CAPSULE | Freq: Four times a day (QID) | ORAL | Status: DC | PRN

## 2012-03-23 MED ORDER — FENTANYL CITRATE 0.05 MG/ML IJ SOLN
INTRAMUSCULAR | Status: AC
  Filled 2012-03-23: qty 2

## 2012-03-23 MED ORDER — LANOLIN HYDROUS EX OINT: 1.0000 "application " | TOPICAL_OINTMENT | CUTANEOUS | Status: DC | PRN

## 2012-03-23 MED ORDER — PHENYLEPHRINE 40 MCG/ML (10ML) SYRINGE FOR IV PUSH (FOR BLOOD PRESSURE SUPPORT)
PREFILLED_SYRINGE | INTRAVENOUS | Status: AC
  Filled 2012-03-23: qty 5

## 2012-03-23 MED ORDER — SCOPOLAMINE 1 MG/3DAYS TD PT72
1.0000 | MEDICATED_PATCH | Freq: Once | TRANSDERMAL | Status: DC
  Administered 2012-03-23: 1.5 mg via TRANSDERMAL

## 2012-03-23 MED ORDER — FENTANYL CITRATE 0.05 MG/ML IJ SOLN
INTRAMUSCULAR | Status: DC | PRN
  Administered 2012-03-23: 75 ug via INTRAVENOUS

## 2012-03-23 MED ORDER — KETOROLAC TROMETHAMINE 30 MG/ML IJ SOLN
30.0000 mg | Freq: Four times a day (QID) | INTRAMUSCULAR | Status: AC | PRN
  Administered 2012-03-23: 30 mg via INTRAVENOUS
  Filled 2012-03-23: qty 1

## 2012-03-23 MED ORDER — SIMETHICONE 80 MG PO CHEW
80.0000 mg | CHEWABLE_TABLET | Freq: Three times a day (TID) | ORAL | Status: DC
  Administered 2012-03-24 – 2012-03-25 (×3): 80 mg via ORAL

## 2012-03-23 MED ORDER — METOCLOPRAMIDE HCL 5 MG/ML IJ SOLN: 10.0000 mg | Freq: Three times a day (TID) | INTRAMUSCULAR | Status: DC | PRN

## 2012-03-23 MED ORDER — ONDANSETRON HCL 4 MG PO TABS: 4.0000 mg | ORAL_TABLET | ORAL | Status: DC | PRN

## 2012-03-23 MED ORDER — DIBUCAINE 1 % RE OINT: 1.0000 "application " | TOPICAL_OINTMENT | RECTAL | Status: DC | PRN

## 2012-03-23 MED ORDER — ONDANSETRON HCL 4 MG/2ML IJ SOLN: 4.0000 mg | INTRAMUSCULAR | Status: DC | PRN

## 2012-03-23 MED ORDER — LACTATED RINGERS IV SOLN
Freq: Once | INTRAVENOUS | Status: AC
  Administered 2012-03-23 (×3): via INTRAVENOUS

## 2012-03-23 MED ORDER — TETANUS-DIPHTH-ACELL PERTUSSIS 5-2.5-18.5 LF-MCG/0.5 IM SUSP
0.5000 mL | Freq: Once | INTRAMUSCULAR | Status: AC
  Administered 2012-03-25: 0.5 mL via INTRAMUSCULAR
  Filled 2012-03-23: qty 0.5

## 2012-03-23 MED ORDER — OXYTOCIN 40 UNITS IN LACTATED RINGERS INFUSION - SIMPLE MED: 62.5000 mL/h | INTRAVENOUS | Status: AC

## 2012-03-23 MED ORDER — DIPHENHYDRAMINE HCL 50 MG/ML IJ SOLN
INTRAMUSCULAR | Status: AC
  Filled 2012-03-23: qty 1

## 2012-03-23 MED ORDER — LACTATED RINGERS IV SOLN: INTRAVENOUS | Status: DC

## 2012-03-23 MED ORDER — KETOROLAC TROMETHAMINE 30 MG/ML IJ SOLN: 30.0000 mg | Freq: Four times a day (QID) | INTRAMUSCULAR | Status: AC | PRN

## 2012-03-23 MED ORDER — ONDANSETRON HCL 4 MG/2ML IJ SOLN
INTRAMUSCULAR | Status: DC | PRN
  Administered 2012-03-23: 4 mg via INTRAVENOUS

## 2012-03-23 MED ORDER — LABETALOL HCL 5 MG/ML IV SOLN
10.0000 mg | INTRAVENOUS | Status: DC | PRN
  Administered 2012-03-23: 10 mg via INTRAVENOUS
  Filled 2012-03-23: qty 8

## 2012-03-23 MED ORDER — ZOLPIDEM TARTRATE 5 MG PO TABS
5.0000 mg | ORAL_TABLET | Freq: Every evening | ORAL | Status: DC | PRN
Start: 2012-03-23 — End: 2012-03-25

## 2012-03-23 MED ORDER — MEASLES, MUMPS & RUBELLA VAC ~~LOC~~ INJ
0.5000 mL | INJECTION | Freq: Once | SUBCUTANEOUS | Status: DC
  Filled 2012-03-23: qty 0.5

## 2012-03-23 MED ORDER — KETOROLAC TROMETHAMINE 60 MG/2ML IM SOLN
60.0000 mg | Freq: Once | INTRAMUSCULAR | Status: AC | PRN
  Filled 2012-03-23: qty 2

## 2012-03-23 MED ORDER — DIPHENHYDRAMINE HCL 25 MG PO CAPS
25.0000 mg | ORAL_CAPSULE | ORAL | Status: DC | PRN
  Filled 2012-03-23: qty 1

## 2012-03-23 MED ORDER — DIPHENHYDRAMINE HCL 50 MG/ML IJ SOLN
12.5000 mg | INTRAMUSCULAR | Status: DC | PRN
  Administered 2012-03-23: 12.5 mg via INTRAVENOUS

## 2012-03-23 MED ORDER — NALBUPHINE HCL 10 MG/ML IJ SOLN
5.0000 mg | INTRAMUSCULAR | Status: DC | PRN
  Administered 2012-03-23: 10 mg via SUBCUTANEOUS
  Administered 2012-03-23 – 2012-03-24 (×2): 5 mg via SUBCUTANEOUS
  Administered 2012-03-24: 10 mg via SUBCUTANEOUS
  Filled 2012-03-23 (×3): qty 1

## 2012-03-23 MED ORDER — NALOXONE HCL 1 MG/ML IJ SOLN
1.0000 ug/kg/h | INTRAVENOUS | Status: DC | PRN
  Filled 2012-03-23: qty 2

## 2012-03-23 MED ORDER — GLYCOPYRROLATE 0.2 MG/ML IJ SOLN
INTRAMUSCULAR | Status: AC
  Filled 2012-03-23: qty 1

## 2012-03-23 MED ORDER — WITCH HAZEL-GLYCERIN EX PADS: 1.0000 "application " | MEDICATED_PAD | CUTANEOUS | Status: DC | PRN

## 2012-03-23 MED ORDER — LIDOCAINE HCL (PF) 1 % IJ SOLN
INTRAMUSCULAR | Status: AC
  Filled 2012-03-23: qty 5

## 2012-03-23 MED ORDER — NALOXONE HCL 0.4 MG/ML IJ SOLN: 0.4000 mg | INTRAMUSCULAR | Status: DC | PRN

## 2012-03-23 MED ORDER — ONDANSETRON HCL 4 MG/2ML IJ SOLN
INTRAMUSCULAR | Status: AC
  Filled 2012-03-23: qty 2

## 2012-03-23 MED ORDER — PRENATAL MULTIVITAMIN CH: 1.0000 | ORAL_TABLET | Freq: Every day | ORAL | Status: DC

## 2012-03-23 SURGICAL SUPPLY — 40 items
CLOTH BEACON ORANGE TIMEOUT ST (SAFETY) ×2 IMPLANT
CONTAINER PREFILL 10% NBF 15ML (MISCELLANEOUS) IMPLANT
DRAIN JACKSON PRT FLT 7MM (DRAIN) IMPLANT
DRESSING TELFA 8X3 (GAUZE/BANDAGES/DRESSINGS) ×2 IMPLANT
DRSG COVADERM 4X10 (GAUZE/BANDAGES/DRESSINGS) IMPLANT
DURAPREP 26ML APPLICATOR (WOUND CARE) ×2 IMPLANT
ELECT REM PT RETURN 9FT ADLT (ELECTROSURGICAL) ×2
ELECTRODE REM PT RTRN 9FT ADLT (ELECTROSURGICAL) ×1 IMPLANT
EVACUATOR SILICONE 100CC (DRAIN) IMPLANT
EXTRACTOR VACUUM M CUP 4 TUBE (SUCTIONS) IMPLANT
GAUZE SPONGE 4X4 12PLY STRL LF (GAUZE/BANDAGES/DRESSINGS) ×4 IMPLANT
GLOVE BIOGEL PI IND STRL 8.5 (GLOVE) ×1 IMPLANT
GLOVE BIOGEL PI INDICATOR 8.5 (GLOVE) ×1
GLOVE ECLIPSE 8.0 STRL XLNG CF (GLOVE) ×4 IMPLANT
GOWN PREVENTION PLUS LG XLONG (DISPOSABLE) ×4 IMPLANT
GOWN PREVENTION PLUS XXLARGE (GOWN DISPOSABLE) ×2 IMPLANT
KIT ABG SYR 3ML LUER SLIP (SYRINGE) IMPLANT
NEEDLE HYPO 25X1 1.5 SAFETY (NEEDLE) ×2 IMPLANT
NEEDLE HYPO 25X5/8 SAFETYGLIDE (NEEDLE) IMPLANT
PACK C SECTION WH (CUSTOM PROCEDURE TRAY) ×2 IMPLANT
PAD ABD 7.5X8 STRL (GAUZE/BANDAGES/DRESSINGS) ×2 IMPLANT
PAD OB MATERNITY 4.3X12.25 (PERSONAL CARE ITEMS) IMPLANT
RINGERS IRRIG 1000ML POUR BTL (IV SOLUTION) ×2 IMPLANT
SLEEVE SCD COMPRESS KNEE MED (MISCELLANEOUS) ×2 IMPLANT
SPONGE GAUZE 4X4 12PLY (GAUZE/BANDAGES/DRESSINGS) ×2 IMPLANT
STAPLER VISISTAT 35W (STAPLE) IMPLANT
SUT MNCRL AB 3-0 PS2 27 (SUTURE) IMPLANT
SUT PLAIN 0 NONE (SUTURE) IMPLANT
SUT SILK 3 0 FS 1X18 (SUTURE) IMPLANT
SUT VIC AB 0 CT1 27 (SUTURE) ×2
SUT VIC AB 0 CT1 27XBRD ANBCTR (SUTURE) ×2 IMPLANT
SUT VIC AB 2-0 CTX 36 (SUTURE) ×4 IMPLANT
SUT VIC AB 3-0 CT1 27 (SUTURE)
SUT VIC AB 3-0 CT1 TAPERPNT 27 (SUTURE) IMPLANT
SUT VIC AB 3-0 SH 27 (SUTURE) ×1
SUT VIC AB 3-0 SH 27X BRD (SUTURE) ×1 IMPLANT
SYR CONTROL 10ML LL (SYRINGE) ×2 IMPLANT
TOWEL OR 17X24 6PK STRL BLUE (TOWEL DISPOSABLE) ×4 IMPLANT
TRAY FOLEY CATH 14FR (SET/KITS/TRAYS/PACK) ×2 IMPLANT
WATER STERILE IRR 1000ML POUR (IV SOLUTION) ×2 IMPLANT

## 2012-03-23 NOTE — H&P (Signed)
The patient was interviewed and examined today.  The previously documented history and physical examination was reviewed. There are no changes. The operative procedure was reviewed. The risks and benefits were outlined again. The specific risks include, but are not limited to, anesthetic complications, bleeding, infections, and possible damage to the surrounding organs. The patient's questions were answered.  We are ready to proceed as outlined. The likelihood of the patient achieving the goals of this procedure is very likely.   CBC    Component Value Date/Time   WBC 10.1 03/23/2012 0953   RBC 3.55* 03/23/2012 0953   HGB 12.4 03/23/2012 0953   HCT 35.4* 03/23/2012 0953   PLT 132* 03/23/2012 0953   MCV 99.7 03/23/2012 0953   MCH 34.9* 03/23/2012 0953   MCHC 35.0 03/23/2012 0953   RDW 14.5 03/23/2012 0953   LYMPHSABS 3.3 08/17/2011 1143   MONOABS 0.5 08/17/2011 1143   EOSABS 0.1 08/17/2011 1143   BASOSABS 0.0 08/17/2011 1143    BP 130/87  Pulse 70  Temp 98.1 F (36.7 C) (Oral)  Resp 18  Ht 5\' 3"  (1.6 m)  Wt 183 lb (83.008 kg)  BMI 32.42 kg/m2  SpO2 100%  LMP 06/24/2011  Leonard Schwartz, M.D.

## 2012-03-23 NOTE — Anesthesia Postprocedure Evaluation (Signed)
Anesthesia Post Note  Patient: Anita Fuentes  Procedure(s) Performed: Procedure(s) (LRB): CESAREAN SECTION (N/A)  Anesthesia type: Spinal  Patient location: PACU  Post pain: Pain level controlled  Post assessment: Post-op Vital signs reviewed  Last Vitals:  Filed Vitals:   03/23/12 1245  BP: 123/87  Pulse: 81  Temp:   Resp: 18    Post vital signs: Reviewed  Level of consciousness: awake  Complications: No apparent anesthesia complications

## 2012-03-23 NOTE — Consult Note (Signed)
Neonatology Note:   Attendance at C-section:    I was asked to attend this repeat C/S at term. The mother is a G4P3 O pos, GBS pos with a 5 year history of cigarette smoking and a persistently positive RPR, treated during the pregnancy. ROM at delivery, fluid clear. Infant vigorous with good spontaneous cry and tone. Needed only minimal bulb suctioning. Ap 9/9. Lungs clear to ausc in DR. To CN to care of Pediatrician.   Deatra James, MD

## 2012-03-23 NOTE — Transfer of Care (Signed)
Immediate Anesthesia Transfer of Care Note  Patient: Anita Fuentes  Procedure(s) Performed: Procedure(s) (LRB) with comments: CESAREAN SECTION (N/A) - Repeat  Patient Location: PACU  Anesthesia Type:Spinal  Level of Consciousness: awake, alert  and oriented  Airway & Oxygen Therapy: Patient Spontanous Breathing  Post-op Assessment: Report given to PACU RN and Post -op Vital signs reviewed and stable  Post vital signs: Reviewed and stable  Complications: No apparent anesthesia complications

## 2012-03-23 NOTE — Op Note (Signed)
OPERATIVE NOTE  Patient's Name: Anita Fuentes  Date of Birth: 03/29/1981   Medical Records Number: [id number]   Date of Operation: 03/23/2012   Preoperative diagnosis:  [redacted]w[redacted]d weeks gestation  Prior Cesarean Sections x 4  Desires Repeat Cesarean Section  Postoperative diagnosis:  [redacted]w[redacted]d weeks gestation  Prior Cesarean Sections x 4  Desires Repeat Cesarean Section  Procedure:  Repeat low transverse cesarean section  Surgeon:  Leonard Schwartz, M.D.  Assistant:  Denny Levy, certified nurse midwife  Anesthesia:  Regional  Disposition:  Anita Fuentes is a 31 y.o. female, [redacted]w[redacted]d, who presents at [redacted]w[redacted]d weeks gestation. The patient has been followed at the [facility] obstetrics and gynecology division of [facility] care for women. This pregnancy has been complicated by a prior cesarean section. The patient desires a repeat cesarean section. She understands the indications for her procedure and she accepts the risk of, but not limited to, anesthetic complications, bleeding, infections, and possible damage to the surrounding organs.  Findings:  A  female (Sincere) was delivered from a occiput posterior position.  The Apgar scores were 9/9. The uterus, fallopian tubes, and ovaries were normal for the gravid state.  Procedure:  The patient was taken to the operating room where a spinal anesthetic was given.The perineum was prepped with betadine. A Foley catheter was placed in the bladder.The patient's abdomen was prepped with Duraprep.   The patient was sterilely draped. The lower abdomen was injected with half percent Marcaine with epinephrine. A low transverse incision was made in the abdomen and carried sharply through the subcutaneous tissue, the fascia, and the anterior peritoneum. An incision was made in the lower uterine segment. The incision was extended in a low transverse fashion. The membranes were ruptured. The fetal head was delivered without  difficulty. The mouth and nose were suctioned. The remainder of the infant was then delivered. The cord was clamped and cut. The infant was handed to the awaiting pediatric team. The placenta was removed. The uterine cavity was cleaned of amniotic fluid, clotted blood, and membranes. The uterine incision was closed using a running locking suture of 2-0 Vicryl. An imbricating suture of 2-0 Vicryl was placed. The pelvis was vigorously irrigated. Hemostasis was adequate. The anterior peritoneum and the abdominal musculature were closed using 2-0 Vicryl. The fascia was closed using a running suture of 0 Vicryl followed by 3 interrupted sutures of 0 Vicryl. The subcutaneous layer was closed using interrupted sutures of 2-0 Vicryl. The skin was reapproximated using a subcuticular suture of 3-0 Monocryl. Sponge, needle, and instrument counts were correct on 2 occasions. The estimated blood loss for the procedure was 800 cc. The patient tolerated her procedure well. She was transported to the recovery room in stable condition. The infant remained in the operating room with the mother for bonding. The placenta was sent to labor and delivery.  Leonard Schwartz, M.D.

## 2012-03-23 NOTE — Anesthesia Procedure Notes (Signed)
Spinal  Patient location during procedure: OR Start time: 03/23/2012 11:26 AM Staffing Anesthesiologist: Caeleb Batalla A. Performed by: anesthesiologist  Preanesthetic Checklist Completed: patient identified, site marked, surgical consent, pre-op evaluation, timeout performed, IV checked, risks and benefits discussed and monitors and equipment checked Spinal Block Patient position: sitting Prep: site prepped and draped and DuraPrep Patient monitoring: heart rate, cardiac monitor, continuous pulse ox and blood pressure Approach: midline Location: L3-4 Injection technique: single-shot Needle Needle type: Sprotte  Needle gauge: 24 G Needle length: 9 cm Assessment Sensory level: T4 Additional Notes Difficult block due to very poor postioning. Multiple attempts.Patient tolerated procedure well. Adequate sensory level.

## 2012-03-23 NOTE — Anesthesia Preprocedure Evaluation (Signed)
Anesthesia Evaluation  Patient identified by MRN, date of birth, ID band Patient awake    Reviewed: Allergy & Precautions, H&P , Patient's Chart, lab work & pertinent test results  Airway Mallampati: II TM Distance: >3 FB Neck ROM: Full    Dental No notable dental hx. (+) Teeth Intact   Pulmonary Current Smoker,  breath sounds clear to auscultation  Pulmonary exam normal       Cardiovascular negative cardio ROS  Rhythm:Regular Rate:Normal     Neuro/Psych  Headaches, negative psych ROS   GI/Hepatic negative GI ROS, Neg liver ROS,   Endo/Other  negative endocrine ROS  Renal/GU negative Renal ROS  negative genitourinary   Musculoskeletal   Abdominal Normal abdominal exam  (+)   Peds  Hematology negative hematology ROS (+)   Anesthesia Other Findings   Reproductive/Obstetrics (+) Pregnancy                           Anesthesia Physical Anesthesia Plan  ASA: II  Anesthesia Plan: Spinal   Post-op Pain Management:    Induction:   Airway Management Planned:   Additional Equipment:   Intra-op Plan:   Post-operative Plan:   Informed Consent: I have reviewed the patients History and Physical, chart, labs and discussed the procedure including the risks, benefits and alternatives for the proposed anesthesia with the patient or authorized representative who has indicated his/her understanding and acceptance.     Plan Discussed with: Anesthesiologist, CRNA and Surgeon  Anesthesia Plan Comments:         Anesthesia Quick Evaluation

## 2012-03-24 DIAGNOSIS — Z98891 History of uterine scar from previous surgery: Secondary | ICD-10-CM | POA: Diagnosis present

## 2012-03-24 LAB — GLUCOSE, CAPILLARY: Glucose-Capillary: 64 mg/dL — ABNORMAL LOW (ref 70–99)

## 2012-03-24 LAB — URINALYSIS, ROUTINE W REFLEX MICROSCOPIC
Bilirubin Urine: NEGATIVE
Glucose, UA: NEGATIVE mg/dL
Ketones, ur: NEGATIVE mg/dL
Protein, ur: NEGATIVE mg/dL
pH: 6 (ref 5.0–8.0)

## 2012-03-24 LAB — CBC
Hemoglobin: 9.6 g/dL — ABNORMAL LOW (ref 12.0–15.0)
MCH: 34.2 pg — ABNORMAL HIGH (ref 26.0–34.0)
MCV: 100 fL (ref 78.0–100.0)
RBC: 2.81 MIL/uL — ABNORMAL LOW (ref 3.87–5.11)
WBC: 11.1 10*3/uL — ABNORMAL HIGH (ref 4.0–10.5)

## 2012-03-24 LAB — COMPREHENSIVE METABOLIC PANEL
ALT: 19 U/L (ref 0–35)
CO2: 28 mEq/L (ref 19–32)
Calcium: 8.9 mg/dL (ref 8.4–10.5)
Chloride: 105 mEq/L (ref 96–112)
Creatinine, Ser: 0.84 mg/dL (ref 0.50–1.10)
GFR calc Af Amer: >90 mL/min (ref >90)
GFR calc non Af Amer: >90 mL/min (ref >90)
Glucose, Bld: 62 mg/dL — ABNORMAL LOW (ref 70–99)
Sodium: 139 mEq/L (ref 135–145)
Total Bilirubin: 0.4 mg/dL (ref 0.3–1.2)

## 2012-03-24 LAB — URINE MICROSCOPIC-ADD ON

## 2012-03-24 NOTE — Progress Notes (Signed)
Subjective:  Postpartum Day 1: Cesarean Delivery Patient reports tolerating PO.  No headaches, blurred vision, or right upper quadrant tenderness.   Objective:  Vital signs in last 24 hours: Temp:  [97.4 F (36.3 C)-98.5 F (36.9 C)] 98.1 F (36.7 C) (11/23 0452) Pulse Rate:  [56-89] 80  (11/23 0452) Resp:  [14-25] 18  (11/23 0452) BP: (108-189)/(68-116) 138/88 mmHg (11/23 0452) SpO2:  [95 %-100 %] 97 % (11/22 1937)  Physical Exam:  General: alert and no distress Chest: Clear Heart: Regular rate and rhythm Lochia: appropriate Uterine Fundus: firm Incision: Dressing clean and dry DVT Evaluation: No evidence of DVT seen on physical exam. Reflexes normal, no clonus  CBC    Component Value Date/Time   WBC 11.1* 03/24/2012 0515   RBC 2.81* 03/24/2012 0515   HGB 9.6* 03/24/2012 0515   HCT 28.1* 03/24/2012 0515   PLT 112* 03/24/2012 0515   MCV 100.0 03/24/2012 0515   MCH 34.2* 03/24/2012 0515   MCHC 34.2 03/24/2012 0515   RDW 14.8 03/24/2012 0515   LYMPHSABS 3.3 08/17/2011 1143   MONOABS 0.5 08/17/2011 1143   EOSABS 0.1 08/17/2011 1143   BASOSABS 0.0 08/17/2011 1143    Basename 03/24/12 0515 03/23/12 0953  HGB 9.6* 12.4  HCT 28.1* 35.4*    Assessment/Plan:  Status post Cesarean section. Doing well postoperatively.  The patient's blood pressure was elevated for a short period of time. She is doing well at this point. There is no evidence of preeclampsia, but we will check PIH labs because her platelet count is slightly low. The patient wants to go home tomorrow. She is bottlefeeding. She will use Depo-Provera for contraception. We will give her a shot before she leaves. Continue current care.  Barett Whidbee V 03/24/2012, 9:10 AM

## 2012-03-24 NOTE — Anesthesia Postprocedure Evaluation (Signed)
  Anesthesia Post-op Note  Patient: Anita Fuentes  Procedure(s) Performed: Procedure(s) (LRB) with comments: CESAREAN SECTION (N/A) - Repeat  Patient Location: Mother/Baby  Anesthesia Type:Spinal  Level of Consciousness: awake, alert  and oriented  Airway and Oxygen Therapy: Patient Spontanous Breathing  Post-op Pain: none  Post-op Assessment: Post-op Vital signs reviewed, Patient's Cardiovascular Status Stable, No headache, No backache, No residual numbness and No residual motor weakness  Post-op Vital Signs: Reviewed and stable  Complications: No apparent anesthesia complications

## 2012-03-24 NOTE — Addendum Note (Signed)
Addendum  created 03/24/12 0902 by Shanon Payor, CRNA   Modules edited:Notes Section

## 2012-03-25 MED ORDER — IBUPROFEN 600 MG PO TABS: 600.0000 mg | ORAL_TABLET | Freq: Four times a day (QID) | ORAL | Status: DC | PRN

## 2012-03-25 MED ORDER — NIFEDIPINE ER 30 MG PO TB24: 30.0000 mg | ORAL_TABLET | Freq: Every day | ORAL | Status: DC

## 2012-03-25 MED ORDER — OXYCODONE-ACETAMINOPHEN 5-325 MG PO TABS
1.0000 | ORAL_TABLET | ORAL | Status: DC | PRN
Start: 2012-03-25 — End: 2012-04-18

## 2012-03-25 MED ORDER — NIFEDIPINE ER 30 MG PO TB24
30.0000 mg | ORAL_TABLET | Freq: Once | ORAL | Status: AC
  Administered 2012-03-25: 30 mg via ORAL
  Filled 2012-03-25: qty 1

## 2012-03-25 MED ORDER — OXYCODONE-ACETAMINOPHEN 5-325 MG PO TABS: 1.0000 | ORAL_TABLET | ORAL | Status: DC | PRN

## 2012-03-25 NOTE — Telephone Encounter (Signed)
Patient called.  Dr. Stefano Gaul

## 2012-03-25 NOTE — Discharge Summary (Signed)
Obstetric Discharge Summary Admission Date:  03/23/12 Discharge Date:  03/25/12  Reason for Admission: cesarean section, repeat C/S x 4. Prenatal Procedures: NST and ultrasound Intrapartum Procedures: cesarean: low cervical, transverse Postpartum Procedures: none Complications-Operative and Postpartum: BP elevation after delivery  Patient Active Problem List  Diagnosis  . Pregnancy, normal, incidental  . History of cesarean section  . History of RPR test  . Tobacco abuse  . 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 Exposure to STD  . History of anemia  . Status post repeat low transverse cesarean section    Hemoglobin  Date Value Range Status  03/24/2012 9.6* 12.0 - 15.0 g/dL Final     DELTA CHECK NOTED     REPEATED TO VERIFY     HCT  Date Value Range Status  03/24/2012 28.1* 36.0 - 46.0 % Final  Pre-delivery Hgb 12.0 (10.6 at 28 weeks) Platelet ct 137 before delivery, 112 after delivery (224 at [facility])  Hospital Course: Patient was admitted on 03/23/12 for a scheduled repeat cesarean delivery.   She was taken to the operating room, where Dr. Stefano Gaul performed a repeat LTCS under spinal anesthesia, with delivery of a viable female, with weight and Apgars as listed below. Infant was in good condition and remained at the patient's bedside.  The patient was taken to recovery in good condition, although she had elevated BPs after delivery--she was given Labetalol IV x 1 dose, with improvement of her BP.  Patient planned to bottle feed.  On post-op day 1, patient was doing well, tolerating a regular diet, with Hgb of 9.6.  Throughout her stay, her physical exam was WNL, her incision was CDI, and her vital signs remained stable.  By post-op day 2, she was up ad lib, tolerating a regular diet, with good pain control with po med.  She desired early d/c.  Her BPs were still in the 140s-150s/80s-90s, but PIH labs were WNL, and urine was negative for protein.  Dr. Stefano Gaul was consulted, and the  patient was begun on Procardia 30 mg XL on the day of discharge.  Her weight on discharge was 175 (down from 189 at admission), without any HA, visual symptoms, or epigastric pain.  She had minimal edema.  She was deemed to have received the full benefit of her hospital stay, and was discharged home in stable condition.  Contraceptive choice was Depo Provera on the day of discharge.    [facility] nursing visit was requested for 2-3 days after discharge for BP check.  Physical Exam:  General: alert Lochia: appropriate Uterine Fundus: firm Incision: healing well DVT Evaluation: No evidence of DVT seen on physical exam. Negative Homan's sign.  Discharge Diagnoses: Term Pregnancy-delivered, PP Hypertension  Discharge Information: Date: 03/25/2012 Activity: Per [facility] handout Diet: routine Medications: Ibuprofen, Percocet and Depo Provera at d/c Condition: stable Instructions: refer to practice specific booklet Discharge to: home Contraception:  Depo Provera at d/c  Follow-up Information    Follow up with [facility]. On 05/01/2012. (Call as needed for any questions or concerns.  Call to schedule circumcision.)    Contact information:   [address] [place] [address] [phone]         Newborn Data: Live born female  Birth Weight: 7 lb 10.2 oz (3465 g) APGAR: 9, 9  Home with mother Plans outpatient circumcision.Marland Kitchen  Nigel Bridgeman 03/25/2012, 8:13 AM

## 2012-03-26 ENCOUNTER — Encounter (HOSPITAL_COMMUNITY): Payer: Self-pay | Admitting: Obstetrics and Gynecology

## 2012-03-26 LAB — TYPE AND SCREEN
ABO/RH(D): O POS
Unit division: 0

## 2012-03-26 NOTE — Progress Notes (Signed)
Post discharge chart review completed.  

## 2012-03-27 ENCOUNTER — Telehealth (HOSPITAL_COMMUNITY): Payer: Self-pay | Admitting: *Deleted

## 2012-03-27 NOTE — Telephone Encounter (Signed)
Resolve episode 

## 2012-04-04 ENCOUNTER — Other Ambulatory Visit: Payer: Self-pay | Admitting: Obstetrics and Gynecology

## 2012-04-04 MED ORDER — HYDROCODONE-ACETAMINOPHEN 5-500 MG PO TABS: ORAL_TABLET | ORAL | Status: DC

## 2012-04-04 NOTE — Telephone Encounter (Signed)
Tc to pt regarding msg.  Pt states had C/S 03/23/12, 4th one.  Took last Oxycodone yesterday and requesting refill. Pt says the pain is not as bad as it was before, but still painful.  Pt denies fever, no issues w/ incision.  Per SR can call in Vicodin #30, pt says Vicodin makes her itch although NKA listed on pt's hx.  Pt says she did not know that she had to let this office know that she itches w/ Vicodin.  Pt then told will need an eval if wants a refill of Oxycodone.  Pt says she will take the Vicodin and will take Benadryl if she has any itching.  If able to come to office will make an appt.

## 2012-04-04 NOTE — Telephone Encounter (Signed)
VM fro pt 04/03/12 at 5:12 PM. Requests RF pain med.  Pt [id number]

## 2012-04-05 ENCOUNTER — Other Ambulatory Visit: Payer: Self-pay | Admitting: Obstetrics and Gynecology

## 2012-04-05 ENCOUNTER — Telehealth: Payer: Self-pay | Admitting: Obstetrics and Gynecology

## 2012-04-05 NOTE — Telephone Encounter (Signed)
Pt returning your call

## 2012-04-13 NOTE — Telephone Encounter (Signed)
Tc to pt regarding msg.  Pt states she is ready to return to work, is feeling well and would like a post partum visit early.  Pt scheduled for a PP visit w/ AVS on Wednesday 04/18/12 @ 1500.

## 2012-04-18 ENCOUNTER — Encounter: Payer: Self-pay | Admitting: Obstetrics and Gynecology

## 2012-04-18 ENCOUNTER — Ambulatory Visit (INDEPENDENT_AMBULATORY_CARE_PROVIDER_SITE_OTHER): Payer: Medicaid Other | Admitting: Obstetrics and Gynecology

## 2012-04-18 MED ORDER — MEDROXYPROGESTERONE ACETATE 150 MG/ML IM SUSP: 150.0000 mg | INTRAMUSCULAR | Status: DC

## 2012-04-18 MED ORDER — TRAMADOL HCL 50 MG PO TABS: 50.0000 mg | ORAL_TABLET | Freq: Four times a day (QID) | ORAL | Status: AC | PRN

## 2012-04-18 NOTE — Progress Notes (Addendum)
HISTORY OF PRESENT ILLNESS  [name] is a 31 y.o. year old female,G4P4004, who presents for a problem visit. The patient has a cesarean section on March 23, 2012.  Subjective:  She is doing okay except she still has pain in her right lower incision.  Objective:  BP 140/80  Temp 98.4 F (36.9 C) (Oral)  Wt 165 lb (74.844 kg)   GI: incision: clean, dry and intact and soft and nontender to exam except in the right lower quadrant.  No sign of a hernia.  External genitalia: normal general appearance Vaginal: normal without tenderness, induration or masses Cervix: nontender Adnexa: normal bimanual exam Uterus: normal size shape and consistency  Assessment:  Doing well status post cesarean section  My discomfort in the right lower incision  Plan:  Depo-Provera for contraception  Return to normal activities per patient request.  Ultram 50 mg every 4-6 hours as needed for pain.  Return to office in 5 month(s) for annual exam.   Arthur Vernon Stringer M.D.  04/18/2012 4:03 PM    Date of delivery: 03/23/12 Female Name: Anita Fuentes Vaginal delivery:no Cesarean section:yes Tubal ligation:no GOT DEPO PROVERA GDM:no Breast Feeding:no Bottle Feeding:yes Post-Partum Blues:no Abnormal pap:YES Normal GU function: yes Normal GI function:yes Returning to work:PT WANT TO RETURN TO WORK. EPDS: 2  

## 2012-04-18 NOTE — Addendum Note (Signed)
Addended by: Tim Lair on: 04/18/2012 04:32 PM   Modules accepted: Orders

## 2012-04-18 NOTE — Progress Notes (Signed)
HISTORY OF PRESENT ILLNESS  [name] is a 31 y.o. year old female,G4P4004, who presents for a problem visit. The patient has a cesarean section on March 23, 2012.  Subjective:  She is doing okay except she still has pain in her right lower incision.  Objective:  BP 140/80  Temp 98.4 F (36.9 C) (Oral)  Wt 165 lb (74.844 kg)   GI: incision: clean, dry and intact and soft and nontender to exam except in the right lower quadrant.  No sign of a hernia.  External genitalia: normal general appearance Vaginal: normal without tenderness, induration or masses Cervix: nontender Adnexa: normal bimanual exam Uterus: normal size shape and consistency  Assessment:  Doing well status post cesarean section  My discomfort in the right lower incision  Plan:  Depo-Provera for contraception  Return to normal activities per patient request.  Ultram 50 mg every 4-6 hours as needed for pain.  Return to office in 5 month(s) for annual exam.   Leonard Schwartz M.D.  04/18/2012 4:03 PM    Date of delivery: 03/23/12 Female Name: Anita Fuentes Vaginal delivery:no Cesarean section:yes Tubal ligation:no GOT DEPO PROVERA GDM:no Breast Feeding:no Bottle Feeding:yes Post-Partum Blues:no Abnormal pap:YES Normal GU function: yes Normal GI function:yes Returning to work:PT WANT TO RETURN TO WORK. EPDS: 2

## 2012-05-01 ENCOUNTER — Encounter: Payer: Medicaid Other | Admitting: Obstetrics and Gynecology

## 2013-10-14 ENCOUNTER — Ambulatory Visit: Payer: Medicaid Other

## 2013-11-06 ENCOUNTER — Ambulatory Visit: Payer: No Typology Code available for payment source | Attending: Internal Medicine

## 2013-12-24 ENCOUNTER — Ambulatory Visit (INDEPENDENT_AMBULATORY_CARE_PROVIDER_SITE_OTHER): Payer: No Typology Code available for payment source | Admitting: Family Medicine

## 2013-12-24 VITALS — BP 138/94 | HR 63 | Temp 98.2°F | Resp 16 | Ht 64.5 in | Wt 155.0 lb

## 2013-12-24 DIAGNOSIS — F32A Depression, unspecified: Secondary | ICD-10-CM

## 2013-12-24 DIAGNOSIS — F17200 Nicotine dependence, unspecified, uncomplicated: Secondary | ICD-10-CM

## 2013-12-24 DIAGNOSIS — Z23 Encounter for immunization: Secondary | ICD-10-CM

## 2013-12-24 DIAGNOSIS — Z7251 High risk heterosexual behavior: Secondary | ICD-10-CM

## 2013-12-24 DIAGNOSIS — R5381 Other malaise: Secondary | ICD-10-CM

## 2013-12-24 DIAGNOSIS — R609 Edema, unspecified: Secondary | ICD-10-CM

## 2013-12-24 DIAGNOSIS — R600 Localized edema: Secondary | ICD-10-CM

## 2013-12-24 DIAGNOSIS — R5383 Other fatigue: Secondary | ICD-10-CM

## 2013-12-24 DIAGNOSIS — I10 Essential (primary) hypertension: Secondary | ICD-10-CM

## 2013-12-24 DIAGNOSIS — F329 Major depressive disorder, single episode, unspecified: Secondary | ICD-10-CM

## 2013-12-24 DIAGNOSIS — F3289 Other specified depressive episodes: Secondary | ICD-10-CM

## 2013-12-24 DIAGNOSIS — R102 Pelvic and perineal pain: Secondary | ICD-10-CM

## 2013-12-24 DIAGNOSIS — R1084 Generalized abdominal pain: Secondary | ICD-10-CM

## 2013-12-24 DIAGNOSIS — K0889 Other specified disorders of teeth and supporting structures: Secondary | ICD-10-CM

## 2013-12-24 DIAGNOSIS — K089 Disorder of teeth and supporting structures, unspecified: Secondary | ICD-10-CM

## 2013-12-24 DIAGNOSIS — M546 Pain in thoracic spine: Secondary | ICD-10-CM

## 2013-12-24 DIAGNOSIS — N949 Unspecified condition associated with female genital organs and menstrual cycle: Secondary | ICD-10-CM

## 2013-12-24 LAB — CBC WITH DIFFERENTIAL/PLATELET
BASOS ABS: 0 10*3/uL (ref 0.0–0.1)
Basophils Relative: 0 % (ref 0–1)
EOS ABS: 0.1 10*3/uL (ref 0.0–0.7)
EOS PCT: 1 % (ref 0–5)
HEMATOCRIT: 43.9 % (ref 36.0–46.0)
Hemoglobin: 15.3 g/dL — ABNORMAL HIGH (ref 12.0–15.0)
LYMPHS PCT: 34 % (ref 12–46)
Lymphs Abs: 3.5 10*3/uL (ref 0.7–4.0)
MCH: 34.4 pg — AB (ref 26.0–34.0)
MCHC: 34.9 g/dL (ref 30.0–36.0)
MCV: 98.7 fL (ref 78.0–100.0)
MONO ABS: 0.5 10*3/uL (ref 0.1–1.0)
Monocytes Relative: 5 % (ref 3–12)
Neutro Abs: 6.2 10*3/uL (ref 1.7–7.7)
Neutrophils Relative %: 60 % (ref 43–77)
PLATELETS: 202 10*3/uL (ref 150–400)
RBC: 4.45 MIL/uL (ref 3.87–5.11)
RDW: 12.9 % (ref 11.5–15.5)
WBC: 10.4 10*3/uL (ref 4.0–10.5)

## 2013-12-24 MED ORDER — HYDROCHLOROTHIAZIDE 12.5 MG PO TABS: 12.5000 mg | ORAL_TABLET | Freq: Every day | ORAL | Status: DC

## 2013-12-24 MED ORDER — IBUPROFEN 800 MG PO TABS: 800.0000 mg | ORAL_TABLET | Freq: Three times a day (TID) | ORAL | Status: DC | PRN

## 2013-12-24 NOTE — Progress Notes (Signed)
Subjective:    Patient ID: Anita Fuentes, female    DOB: 09-16-1980, 33 y.o.   MRN: [id number]  HPI  Patient is in the office to establish care. Patient states that she was recently followed by [facility] family practice.   She reports that she recently had a personal tragedy. She lost her 73 month old son in a drowning accident in April. She reports that she is still in the grieving process. She states that she has not sought counseling for grief. She reports that she has 3 other children that have kept her going. She denies suicidal or homicidal ideations.   Patient here for evaluation of hypertension. She states that she was previously on medications for hypertension. She cannot recall the names of the medications. . She is not exercising and is not adherent to low salt diet. She is unsure whether blood pressure is controlled at home. Patient denies chest pain, dyspnea, orthopnea, palpitations, syncope and tachypnea.  Cardiovascular risk factors include  hypertension, sedentary lifestyle and smoking/ tobacco exposure.   Patient presents for presents evaluation of low back problems.  Symptoms have been present for several months and includes pain in thoracic area described as aching and 2-3/10 in intensity. She denies an inciting event.  Symptoms are worst at night. Exacerbating factors identifiable by patient are bending sideways, standing and walking.    She is also complaining of tobacco dependence. She states that she has been smoking since the age of 47. She states that she is currently up to 1 pack per day. She states that she has not attempted any interventions to stop smoking.    Past Medical History  Diagnosis Date  . Migraines     TYPICALLY TAKES EXCEDERIN MIGRAINE  . History of chlamydia 1998  . History of gonorrhea   . Hx of syphilis 1998  . H/O candidiasis   . Hx: UTI (urinary tract infection)   . Yeast infection   . Abnormal Pap smear     Medication was treatment  .  Exposure to STD 11/04/2011  . History of anemia 11/04/2011  . Anemia     Hx -during Second Pregnancy    Review of Systems  Constitutional: Negative.   HENT: Negative.   Eyes: Positive for discharge.  Respiratory: Negative.   Cardiovascular: Positive for palpitations. Negative for leg swelling.  Gastrointestinal: Negative.   Endocrine: Negative.   Genitourinary: Negative.   Musculoskeletal: Positive for back pain.  Skin: Negative.   Allergic/Immunologic: Negative.   Neurological: Negative.   Hematological: Negative.   Psychiatric/Behavioral: Negative.        Objective:   Physical Exam  Constitutional: She is oriented to person, place, and time. She appears well-developed and well-nourished.  HENT:  Head: Normocephalic.  Right Ear: External ear normal.  Neck: Normal range of motion. Neck supple.  Cardiovascular: Normal rate, regular rhythm and intact distal pulses.   Pulmonary/Chest: Effort normal and breath sounds normal.  Abdominal: Soft. Bowel sounds are normal. There is generalized tenderness. There is no rebound, no CVA tenderness, no tenderness at McBurney's point and negative Murphy's sign.  Musculoskeletal: Normal range of motion.       Thoracic back: She exhibits pain. She exhibits normal range of motion, no swelling, no edema and no spasm.  Neurological: She is alert and oriented to person, place, and time.  Skin: Skin is warm and dry.  Psychiatric: She has a normal mood and affect. Her behavior is normal. Judgment and thought content normal.  BP 138/94  Pulse 63  Temp(Src) 98.2 F (36.8 C)  Resp 16  Ht 5' 4.5" (1.638 m)  Wt 155 lb (70.308 kg)  BMI 26.20 kg/m2  LMP 12/04/2013 Assessment & Plan:  1. Risk for sexually transmitted disease She states that she is sexually active and does not use barrier protection. She has a history of sexually transmitted diseases.  - RPR - GC/Chlamydia Probe Amp - HIV antibody (with reflex)  2. Unspecified essential  hypertension - hydrochlorothiazide (HYDRODIURIL) 12.5 MG tablet; Take 1 tablet (12.5 mg total) by mouth daily.  Dispense: 90 tablet; Refill: 1  3. Abdominal discomfort, generalized - hCG, quantitative, pregnancy - Urinalysis, Complete  4. Vaginal pain She is currently sexually active and has periodic vaginal pain. Will test for STDs.   5. Depression Ms. Ferrebee lost her 54 month old son to a drowning accident in April. She states that she is sad on most days, but tries to remain upbeat for her other 3 children. She states that she has not followed up with counseling. Recommend [facility] walk in clinic M-F 8am-3pm. She states that she is not interested in depression medications or counseling at this time. She reports that she has a strong support system 6. Toothache Patient has multiple dental cavities and gum inflammation. Will send dental referral  7. Tobacco dependence She has been smoking 1 pack per day. She has been smoking since age 43. She states that she is not ready to quit due to a number of stressful situations in her life.   8. Other malaise and fatigue - Vitamin D, 25-hydroxy - CBC with Differential - COMPLETE METABOLIC PANEL WITH GFR  9. Bilateral edema of lower extremity She has trace edema to bilateral lower extremities -Will check CMP 10. Thoracic back pain, unspecified back pain laterality -Will check CMP and vitamin d - ibuprofen (ADVIL,MOTRIN) 800 MG tablet; Take 1 tablet (800 mg total) by mouth every 8 (eight) hours as needed.  Dispense: 30 tablet; Refill: 0  11. Need for Tdap vaccination  - Tdap vaccine greater than or equal to 7yo IM  12. Immunization due  - Pneumococcal polysaccharide vaccine 23-valent greater than or equal to 2yo subcutaneous/IM - Flu Vaccine QUAD 36+ mos PF IM (Fluarix Quad PF)       Last pap smear: 12/13. She states that pap smear was normal  3 living/1 deceased  RTC: 3 months with Dr. Ashley Royalty for CPE  Meds  ordered this encounter  Medications  . hydrochlorothiazide (HYDRODIURIL) 12.5 MG tablet    Sig: Take 1 tablet (12.5 mg total) by mouth daily.    Dispense:  90 tablet    Refill:  1    Order Specific Question:  Supervising Provider    Answer:  MATTHEWS, MICHELLE A [[id number]]  . ibuprofen (ADVIL,MOTRIN) 800 MG tablet    Sig: Take 1 tablet (800 mg total) by mouth every 8 (eight) hours as needed.    Dispense:  30 tablet    Refill:  0    Order Specific Question:  Supervising Provider    Answer:  Marthann Schiller A [[id number]]

## 2013-12-24 NOTE — Patient Instructions (Addendum)
Back Pain, Adult Low back pain is very common. About 1 in 5 people have back pain.The cause of low back pain is rarely dangerous. The pain often gets better over time.About half of people with a sudden onset of back pain feel better in just 2 weeks. About 8 in 10 people feel better by 6 weeks.  CAUSES Some common causes of back pain include:  Strain of the muscles or ligaments supporting the spine.  Wear and tear (degeneration) of the spinal discs.  Arthritis.  Direct injury to the back. DIAGNOSIS Most of the time, the direct cause of low back pain is not known.However, back pain can be treated effectively even when the exact cause of the pain is unknown.Answering your caregiver's questions about your overall health and symptoms is one of the most accurate ways to make sure the cause of your pain is not dangerous. If your caregiver needs more information, he or she may order lab work or imaging tests (X-rays or MRIs).However, even if imaging tests show changes in your back, this usually does not require surgery. HOME CARE INSTRUCTIONS For many people, back pain returns.Since low back pain is rarely dangerous, it is often a condition that people can learn to manageon their own.   Remain active. It is stressful on the back to sit or stand in one place. Do not sit, drive, or stand in one place for more than 30 minutes at a time. Take short walks on level surfaces as soon as pain allows.Try to increase the length of time you walk each day.  Do not stay in bed.Resting more than 1 or 2 days can delay your recovery.  Do not avoid exercise or work.Your body is made to move.It is not dangerous to be active, even though your back may hurt.Your back will likely heal faster if you return to being active before your pain is gone.  Pay attention to your body when you bend and lift. Many people have less discomfortwhen lifting if they bend their knees, keep the load close to their bodies,and  avoid twisting. Often, the most comfortable positions are those that put less stress on your recovering back.  Find a comfortable position to sleep. Use a firm mattress and lie on your side with your knees slightly bent. If you lie on your back, put a pillow under your knees.  Only take over-the-counter or prescription medicines as directed by your caregiver. Over-the-counter medicines to reduce pain and inflammation are often the most helpful.Your caregiver may prescribe muscle relaxant drugs.These medicines help dull your pain so you can more quickly return to your normal activities and healthy exercise.  Put ice on the injured area.  Put ice in a plastic bag.  Place a towel between your skin and the bag.  Leave the ice on for 15-20 minutes, 03-04 times a day for the first 2 to 3 days. After that, ice and heat may be alternated to reduce pain and spasms.  Ask your caregiver about trying back exercises and gentle massage. This may be of some benefit.  Avoid feeling anxious or stressed.Stress increases muscle tension and can worsen back pain.It is important to recognize when you are anxious or stressed and learn ways to manage it.Exercise is a great option. SEEK MEDICAL CARE IF:  You have pain that is not relieved with rest or medicine.  You have pain that does not improve in 1 week.  You have new symptoms.  You are generally not feeling well. SEEK   IMMEDIATE MEDICAL CARE IF:   You have pain that radiates from your back into your legs.  You develop new bowel or bladder control problems.  You have unusual weakness or numbness in your arms or legs.  You develop nausea or vomiting.  You develop abdominal pain.  You feel faint. Document Released: 04/18/2005 Document Revised: 10/18/2011 Document Reviewed: 08/20/2013 [facility] Patient Information 2015 [place], [place]. This information is not intended to replace advice given to you by your health care provider. Make sure you  discuss any questions you have with your health care provider.   Follow-up with [facility] [address]  [phone] Walk in clinic M-F 8am-3pm

## 2013-12-25 LAB — COMPLETE METABOLIC PANEL WITH GFR
ALT: 11 U/L (ref 0–35)
AST: 16 U/L (ref 0–37)
Albumin: 4.7 g/dL (ref 3.5–5.2)
Alkaline Phosphatase: 71 U/L (ref 39–117)
BUN: 7 mg/dL (ref 6–23)
CALCIUM: 9.6 mg/dL (ref 8.4–10.5)
CHLORIDE: 103 meq/L (ref 96–112)
CO2: 25 mEq/L (ref 19–32)
Creat: 0.88 mg/dL (ref 0.50–1.10)
GFR, Est African American: >89 mL/min
GFR, Est Non African American: 87 mL/min
Glucose, Bld: 91 mg/dL (ref 70–99)
POTASSIUM: 3.7 meq/L (ref 3.5–5.3)
SODIUM: 138 meq/L (ref 135–145)
TOTAL PROTEIN: 7.4 g/dL (ref 6.0–8.3)
Total Bilirubin: 0.8 mg/dL (ref 0.2–1.2)

## 2013-12-25 LAB — URINALYSIS, COMPLETE
BILIRUBIN URINE: NEGATIVE
Bacteria, UA: NONE SEEN
CASTS: NONE SEEN
Crystals: NONE SEEN
GLUCOSE, UA: NEGATIVE mg/dL
Hgb urine dipstick: NEGATIVE
KETONES UR: NEGATIVE mg/dL
Leukocytes, UA: NEGATIVE
Nitrite: NEGATIVE
PH: 6 (ref 5.0–8.0)
PROTEIN: NEGATIVE mg/dL
Specific Gravity, Urine: 1.016 (ref 1.005–1.030)
Urobilinogen, UA: 0.2 mg/dL (ref 0.0–1.0)

## 2013-12-25 LAB — VITAMIN D 25 HYDROXY (VIT D DEFICIENCY, FRACTURES): Vit D, 25-Hydroxy: 32 ng/mL (ref 30–89)

## 2013-12-25 LAB — FLUORESCENT TREPONEMAL AB(FTA)-IGG-BLD: FLUORESCENT TREPONEMAL ABS: NONREACTIVE

## 2013-12-25 LAB — HIV ANTIBODY (ROUTINE TESTING W REFLEX): HIV 1&2 Ab, 4th Generation: NONREACTIVE

## 2013-12-25 LAB — HCG, QUANTITATIVE, PREGNANCY

## 2013-12-25 LAB — RPR: RPR: REACTIVE — AB

## 2013-12-25 LAB — RPR TITER

## 2013-12-26 ENCOUNTER — Other Ambulatory Visit: Payer: Self-pay | Admitting: Family Medicine

## 2013-12-26 ENCOUNTER — Telehealth: Payer: Self-pay | Admitting: Family Medicine

## 2013-12-26 ENCOUNTER — Other Ambulatory Visit: Payer: Self-pay

## 2013-12-26 DIAGNOSIS — A539 Syphilis, unspecified: Secondary | ICD-10-CM

## 2013-12-26 DIAGNOSIS — I10 Essential (primary) hypertension: Secondary | ICD-10-CM

## 2013-12-26 NOTE — Telephone Encounter (Signed)
Discussed laboratory results at length. Patient scheduled on 12/26/2013 at 10 am to report to day infusion center for penicillin injection for positive RPR.

## 2013-12-27 ENCOUNTER — Encounter: Payer: Self-pay | Admitting: Family Medicine

## 2013-12-27 ENCOUNTER — Non-Acute Institutional Stay (HOSPITAL_COMMUNITY)
Admission: AD | Admit: 2013-12-27 | Discharge: 2013-12-27 | Disposition: A | Discharge status: Home / Self Care | Payer: No Typology Code available for payment source | Source: Ambulatory Visit | Attending: Internal Medicine | Admitting: Internal Medicine

## 2013-12-27 DIAGNOSIS — I10 Essential (primary) hypertension: Secondary | ICD-10-CM | POA: Insufficient documentation

## 2013-12-27 DIAGNOSIS — R102 Pelvic and perineal pain: Secondary | ICD-10-CM | POA: Insufficient documentation

## 2013-12-27 DIAGNOSIS — Z23 Encounter for immunization: Secondary | ICD-10-CM | POA: Insufficient documentation

## 2013-12-27 DIAGNOSIS — R1084 Generalized abdominal pain: Secondary | ICD-10-CM | POA: Insufficient documentation

## 2013-12-27 DIAGNOSIS — F17200 Nicotine dependence, unspecified, uncomplicated: Secondary | ICD-10-CM | POA: Insufficient documentation

## 2013-12-27 DIAGNOSIS — M549 Dorsalgia, unspecified: Secondary | ICD-10-CM | POA: Insufficient documentation

## 2013-12-27 DIAGNOSIS — K0889 Other specified disorders of teeth and supporting structures: Secondary | ICD-10-CM | POA: Insufficient documentation

## 2013-12-27 DIAGNOSIS — R600 Localized edema: Secondary | ICD-10-CM | POA: Insufficient documentation

## 2013-12-27 DIAGNOSIS — Z7251 High risk heterosexual behavior: Secondary | ICD-10-CM | POA: Insufficient documentation

## 2013-12-27 DIAGNOSIS — R5383 Other fatigue: Secondary | ICD-10-CM

## 2013-12-27 DIAGNOSIS — M545 Low back pain, unspecified: Secondary | ICD-10-CM | POA: Insufficient documentation

## 2013-12-27 DIAGNOSIS — F32A Depression, unspecified: Secondary | ICD-10-CM | POA: Insufficient documentation

## 2013-12-27 DIAGNOSIS — A539 Syphilis, unspecified: Secondary | ICD-10-CM | POA: Insufficient documentation

## 2013-12-27 DIAGNOSIS — R5381 Other malaise: Secondary | ICD-10-CM | POA: Insufficient documentation

## 2013-12-27 DIAGNOSIS — F329 Major depressive disorder, single episode, unspecified: Secondary | ICD-10-CM | POA: Insufficient documentation

## 2013-12-27 MED ORDER — PENICILLIN G BENZATHINE 1200000 UNIT/2ML IM SUSP
2.4000 10*6.[IU] | Freq: Once | INTRAMUSCULAR | Status: AC
  Administered 2013-12-27: 2.4 10*6.[IU] via INTRAMUSCULAR
  Filled 2013-12-27: qty 4

## 2013-12-27 NOTE — Discharge Instructions (Signed)

## 2013-12-27 NOTE — Procedures (Signed)
[facility] Day [facility]  Procedure Note  Anita Fuentes NFA:[id number] DOB: Dec 29, 1980 DOA: 12/27/2013   PCP: Marthann Schiller MD    Associated Diagnosis: Syphilis (acquired)  Procedure Note: Injections x2 given deep IM   Condition During Procedure: Tolerated well   Condition at Discharge: Patient discharged home. Patient in no apparent distress.Patient left day hospital with belongings ambulatory via self.    Allyn Kenner, RN  [facility]

## 2014-03-03 ENCOUNTER — Encounter: Payer: Self-pay | Admitting: Family Medicine

## 2014-03-12 ENCOUNTER — Telehealth: Payer: Self-pay | Admitting: Internal Medicine

## 2014-03-12 NOTE — Telephone Encounter (Signed)
Called to patient to reschedule. Left voicemail for patient to call to reschedule appointment from 03/26/14.

## 2014-03-13 ENCOUNTER — Inpatient Hospital Stay (HOSPITAL_COMMUNITY)
Admission: AD | Admit: 2014-03-13 | Discharge: 2014-03-13 | Disposition: A | Discharge status: Home / Self Care | Payer: Self-pay | Source: Ambulatory Visit | Attending: Obstetrics & Gynecology | Admitting: Obstetrics & Gynecology

## 2014-03-13 ENCOUNTER — Encounter (HOSPITAL_COMMUNITY): Payer: Self-pay | Admitting: General Practice

## 2014-03-13 DIAGNOSIS — F17210 Nicotine dependence, cigarettes, uncomplicated: Secondary | ICD-10-CM | POA: Insufficient documentation

## 2014-03-13 DIAGNOSIS — N938 Other specified abnormal uterine and vaginal bleeding: Secondary | ICD-10-CM | POA: Insufficient documentation

## 2014-03-13 DIAGNOSIS — R102 Pelvic and perineal pain: Secondary | ICD-10-CM | POA: Insufficient documentation

## 2014-03-13 LAB — WET PREP, GENITAL
Clue Cells Wet Prep HPF POC: NONE SEEN
Trich, Wet Prep: NONE SEEN
WBC, Wet Prep HPF POC: NONE SEEN
Yeast Wet Prep HPF POC: NONE SEEN

## 2014-03-13 LAB — URINALYSIS, ROUTINE W REFLEX MICROSCOPIC
Bilirubin Urine: NEGATIVE
Glucose, UA: NEGATIVE mg/dL
KETONES UR: 15 mg/dL — AB
LEUKOCYTES UA: NEGATIVE
Nitrite: NEGATIVE
PROTEIN: NEGATIVE mg/dL
Specific Gravity, Urine: >1.03 — ABNORMAL HIGH (ref 1.005–1.030)
Urobilinogen, UA: 0.2 mg/dL (ref 0.0–1.0)
pH: 6 (ref 5.0–8.0)

## 2014-03-13 LAB — POCT PREGNANCY, URINE: Preg Test, Ur: NEGATIVE

## 2014-03-13 LAB — URINE MICROSCOPIC-ADD ON

## 2014-03-13 LAB — CBC
HEMATOCRIT: 37.9 % (ref 36.0–46.0)
Hemoglobin: 13.3 g/dL (ref 12.0–15.0)
MCH: 34.7 pg — ABNORMAL HIGH (ref 26.0–34.0)
MCHC: 35.1 g/dL (ref 30.0–36.0)
MCV: 99 fL (ref 78.0–100.0)
Platelets: 227 10*3/uL (ref 150–400)
RBC: 3.83 MIL/uL — ABNORMAL LOW (ref 3.87–5.11)
RDW: 12.9 % (ref 11.5–15.5)
WBC: 9.6 10*3/uL (ref 4.0–10.5)

## 2014-03-13 NOTE — MAU Note (Signed)
Unintentional wt loss.  20 lbs in 3 months

## 2014-03-13 NOTE — MAU Provider Note (Signed)
History     CSN: [id number]  Arrival date and time: 03/13/14 1012   First Provider Initiated Contact with Patient 03/13/14 1254      Chief Complaint  Patient presents with  . Abdominal Pain  . Vaginal Bleeding   HPI   Pt is a 33 y/o G4P4004 who presents today with vaginal bleeding and abdominal pain. She reports sharp lower abdominal pain off and on for the past few years with more noticeable pain since Monday. She has taken ibuprofen which provided moderate relief. She says the pain is worsened with too much activity. She denies any nausea, vomiting, diarrhea, constipation, melena, or dyspareunia. She also reports vaginal bleeding since 11/2. She reports using 6-7 pad/day but denies any bleeding today. She says her LMP was the end of October and reports having 2 periods that month. She denies any fever, chills, urinary symptoms, vaginal discharge, or lesions. She denies using anything for birth control. She reports one sexual partner in the past 3 years. She denies any pregnancy complications and delivered via c-section. She reports a family history of uterine fibroids and cysts. She does not have a PCP or gynecologist. She reports a history of syphilis.   OB History    Gravida Para Term Preterm AB TAB SAB Ectopic Multiple Living   4 4 4  0 0 0 0 0 0 4      Past Medical History  Diagnosis Date  . Migraines     TYPICALLY TAKES EXCEDERIN MIGRAINE  . History of chlamydia 1998  . History of gonorrhea   . Hx of syphilis 1998  . H/O candidiasis   . Hx: UTI (urinary tract infection)   . Yeast infection   . Abnormal Pap smear     Medication was treatment  . Exposure to STD 11/04/2011  . History of anemia 11/04/2011  . Anemia     Hx -during Second Pregnancy    Past Surgical History  Procedure Laterality Date  . Cesarean section    . Cesarean section  03/23/2012    Procedure: CESAREAN SECTION;  Surgeon: Kirkland HunArthur Stringer, MD;  Location: [facility];  Service: Obstetrics;  Laterality: N/A;   Repeat    Family History  Problem Relation Age of Onset  . Diabetes Father   . Hypertension Father   . Hypertension Sister   . Diabetes Sister     ADULT ONSET  . Hypertension Mother   . Heart disease Mother   . Diabetes Mother   . Thyroid disease Maternal Uncle   . Heart disease Maternal Grandmother   . Hypertension Maternal Grandmother   . Diabetes Maternal Grandmother   . Diabetes Paternal Grandmother     History  Substance Use Topics  . Smoking status: Current Every Day Smoker -- 1.00 packs/day for 15 years    Types: Cigarettes  . Smokeless tobacco: Never Used     Comment: PT COUNSELED TO TRY DECREASING AMOUT SHE SMOKES A DAY UNTIL SHE CAN EVENTUALLY QUIT  . Alcohol Use: 0.0 oz/week     Comment: "every now and then. I haven't had none lately'    Allergies: No Known Allergies  Prescriptions prior to admission  Medication Sig Dispense Refill Last Dose  . acetaminophen (TYLENOL) 500 MG tablet Take 500 mg by mouth every 6 (six) hours as needed. For pain   Taking  . calcium carbonate (TUMS - DOSED IN MG ELEMENTAL CALCIUM) 500 MG chewable tablet Chew 2 tablets by mouth at bedtime.   Taking  . hydrochlorothiazide (  HYDRODIURIL) 12.5 MG tablet Take 1 tablet (12.5 mg total) by mouth daily. 90 tablet 1   . ibuprofen (ADVIL,MOTRIN) 800 MG tablet Take 1 tablet (800 mg total) by mouth every 8 (eight) hours as needed. 30 tablet 0   . NIFEdipine (PROCARDIA-XL/ADALAT CC) 30 MG 24 hr tablet Take 1 tablet (30 mg total) by mouth daily. 30 tablet 2 Not Taking  . Prenatal Vit-Fe Fumarate-FA (PRENATAL MULTIVITAMIN) TABS Take 1 tablet by mouth daily. 1 tablet 11 Not Taking    Review of Systems  Constitutional: Negative for fever and chills.  Gastrointestinal: Positive for abdominal pain. Negative for nausea, vomiting, diarrhea, constipation, blood in stool and melena.  Genitourinary: Negative for dysuria, urgency, frequency, hematuria and flank pain.       Negative for vaginal discharge or  lesions. Positive for vaginal bleeding  Musculoskeletal:       Positive for low back pain   Physical Exam   Blood pressure 124/84, pulse 80, temperature 98.3 F (36.8 C), temperature source Oral, resp. rate 20, weight 69.854 kg (154 lb), last menstrual period 03/03/2014.  Physical Exam  Constitutional: She is oriented to person, place, and time. She appears well-developed and well-nourished.  Cardiovascular: Normal rate, regular rhythm and normal heart sounds.  Exam reveals no friction rub.   No murmur heard. Respiratory: Effort normal and breath sounds normal. No respiratory distress. She has no wheezes. She has no rales.  GI: Soft. She exhibits no distension. Bowel sounds are decreased. There is tenderness in the right lower quadrant, suprapubic area and left lower quadrant. There is no rebound, no guarding, no CVA tenderness and no tenderness at McBurney's point.  C-section incision: no erythema, edema, drainage, or induration. Tender to palpation.   Genitourinary: There is no rash on the right labia. There is no rash on the left labia. Uterus is tender. Cervix exhibits no motion tenderness, no discharge and no friability. Right adnexum displays tenderness. Right adnexum displays no mass. Left adnexum displays tenderness. Left adnexum displays no mass. There is bleeding in the vagina. No erythema or tenderness in the vagina. No signs of injury around the vagina. No vaginal discharge found.  Minimal bleeding in vaginal vault- covered tip of one fox swab.   Musculoskeletal: She exhibits no edema or tenderness.  Neurological: She is alert and oriented to person, place, and time.    MAU Course  Procedures  UPT: negative  U/A: specific gravity > 1.030, ketones: 15  CBC: WNL. No anemia  Wet prep: WNL GC/C: pending  Results for orders placed or performed during the hospital encounter of 03/13/14 (from the past 72 hour(s))  Urinalysis, Routine w reflex microscopic     Status: Abnormal    Collection Time: 03/13/14 10:50 AM  Result Value Ref Range   Color, Urine YELLOW YELLOW   APPearance CLEAR CLEAR   Specific Gravity, Urine >1.030 (H) 1.005 - 1.030   pH 6.0 5.0 - 8.0   Glucose, UA NEGATIVE NEGATIVE mg/dL   Hgb urine dipstick SMALL (A) NEGATIVE   Bilirubin Urine NEGATIVE NEGATIVE   Ketones, ur 15 (A) NEGATIVE mg/dL   Protein, ur NEGATIVE NEGATIVE mg/dL   Urobilinogen, UA 0.2 0.0 - 1.0 mg/dL   Nitrite NEGATIVE NEGATIVE   Leukocytes, UA NEGATIVE NEGATIVE  Urine microscopic-add on     Status: Abnormal   Collection Time: 03/13/14 10:50 AM  Result Value Ref Range   Squamous Epithelial / LPF FEW (A) RARE   WBC, UA 0-2 <3 WBC/hpf   RBC /  HPF 0-2 <3 RBC/hpf   Bacteria, UA FEW (A) RARE   Urine-Other MUCOUS PRESENT   Pregnancy, urine POC     Status: None   Collection Time: 03/13/14 10:59 AM  Result Value Ref Range   Preg Test, Ur NEGATIVE NEGATIVE    Comment:        THE SENSITIVITY OF THIS METHODOLOGY IS >24 mIU/mL   CBC     Status: Abnormal   Collection Time: 03/13/14  1:27 PM  Result Value Ref Range   WBC 9.6 4.0 - 10.5 K/uL   RBC 3.83 (L) 3.87 - 5.11 MIL/uL   Hemoglobin 13.3 12.0 - 15.0 g/dL   HCT 13.2 44.0 - 10.2 %   MCV 99.0 78.0 - 100.0 fL   MCH 34.7 (H) 26.0 - 34.0 pg   MCHC 35.1 30.0 - 36.0 g/dL   RDW 72.5 36.6 - 44.0 %   Platelets 227 150 - 400 K/uL  Wet prep, genital     Status: None   Collection Time: 03/13/14  1:48 PM  Result Value Ref Range   Yeast Wet Prep HPF POC NONE SEEN NONE SEEN   Trich, Wet Prep NONE SEEN NONE SEEN   Clue Cells Wet Prep HPF POC NONE SEEN NONE SEEN   WBC, Wet Prep HPF POC NONE SEEN NONE SEEN    Comment: MODERATE BACTERIA SEEN   Infection unlikely due to normal WBC, no vaginal/cervical discharge, no CMT.   Assessment and Plan  A: 33 y/o G4P4 with pelvic pain and vaginal bleeding secondary to DUB.  P: D/C home  - Encouraged hydration.  - Take OTC ibuprofen prn for pain  - F/u with GYN for further workup (Pt given  contact info for HD and [facility]OC should she not return to [facility].)  Janalyn Rouse 03/13/2014, 1:24 PM

## 2014-03-13 NOTE — MAU Note (Signed)
Pain in lower abd, started on Mon.  Has been bleeding real heavy since 11/02. Came on when expected period.   Had 2 periods in Oct.

## 2014-03-14 LAB — GC/CHLAMYDIA PROBE AMP
CT Probe RNA: NEGATIVE
GC Probe RNA: NEGATIVE

## 2014-03-14 LAB — HIV ANTIBODY (ROUTINE TESTING W REFLEX): HIV: NONREACTIVE

## 2014-03-26 ENCOUNTER — Ambulatory Visit: Payer: No Typology Code available for payment source | Admitting: Internal Medicine

## 2014-04-18 ENCOUNTER — Ambulatory Visit: Payer: No Typology Code available for payment source | Attending: Internal Medicine

## 2014-04-29 ENCOUNTER — Other Ambulatory Visit (HOSPITAL_COMMUNITY)
Admission: RE | Admit: 2014-04-29 | Discharge: 2014-04-29 | Disposition: A | Discharge status: Home / Self Care | Payer: No Typology Code available for payment source | Source: Ambulatory Visit | Attending: Family Medicine | Admitting: Family Medicine

## 2014-04-29 ENCOUNTER — Encounter (HOSPITAL_COMMUNITY): Payer: Self-pay | Admitting: Emergency Medicine

## 2014-04-29 ENCOUNTER — Emergency Department (HOSPITAL_COMMUNITY)
Admission: EM | Admit: 2014-04-29 | Discharge: 2014-04-29 | Disposition: A | Discharge status: Home / Self Care | Payer: No Typology Code available for payment source | Source: Home / Self Care | Attending: Family Medicine | Admitting: Family Medicine

## 2014-04-29 DIAGNOSIS — Z113 Encounter for screening for infections with a predominantly sexual mode of transmission: Secondary | ICD-10-CM | POA: Insufficient documentation

## 2014-04-29 DIAGNOSIS — Z202 Contact with and (suspected) exposure to infections with a predominantly sexual mode of transmission: Secondary | ICD-10-CM

## 2014-04-29 DIAGNOSIS — N760 Acute vaginitis: Secondary | ICD-10-CM | POA: Insufficient documentation

## 2014-04-29 LAB — CERVICOVAGINAL ANCILLARY ONLY
Wet Prep (BD Affirm): NEGATIVE
Wet Prep (BD Affirm): NEGATIVE
Wet Prep (BD Affirm): POSITIVE — AB

## 2014-04-29 LAB — POCT URINALYSIS DIP (DEVICE)
Bilirubin Urine: NEGATIVE
Glucose, UA: NEGATIVE mg/dL
Hgb urine dipstick: NEGATIVE
Ketones, ur: NEGATIVE mg/dL
LEUKOCYTES UA: NEGATIVE
NITRITE: NEGATIVE
PH: 6 (ref 5.0–8.0)
PROTEIN: NEGATIVE mg/dL
Specific Gravity, Urine: >=1.03 (ref 1.005–1.030)
Urobilinogen, UA: 0.2 mg/dL (ref 0.0–1.0)

## 2014-04-29 LAB — POCT PREGNANCY, URINE: Preg Test, Ur: NEGATIVE

## 2014-04-29 MED ORDER — CEFTRIAXONE SODIUM 250 MG IJ SOLR
250.0000 mg | Freq: Once | INTRAMUSCULAR | Status: AC
  Administered 2014-04-29: 250 mg via INTRAMUSCULAR

## 2014-04-29 MED ORDER — LIDOCAINE HCL (PF) 1 % IJ SOLN
INTRAMUSCULAR | Status: AC
  Filled 2014-04-29: qty 5

## 2014-04-29 MED ORDER — AZITHROMYCIN 250 MG PO TABS
ORAL_TABLET | ORAL | Status: AC
  Filled 2014-04-29: qty 4

## 2014-04-29 MED ORDER — CEFTRIAXONE SODIUM 250 MG IJ SOLR
INTRAMUSCULAR | Status: AC
  Filled 2014-04-29: qty 250

## 2014-04-29 MED ORDER — AZITHROMYCIN 250 MG PO TABS
1000.0000 mg | ORAL_TABLET | Freq: Once | ORAL | Status: AC
  Administered 2014-04-29: 1000 mg via ORAL

## 2014-04-29 NOTE — ED Provider Notes (Signed)
CSN: [id number]     Arrival date & time 04/29/14  1346 History   First MD Initiated Contact with Patient 04/29/14 1355     Chief Complaint  Patient presents with  . Vaginal Pain   (Consider location/radiation/quality/duration/timing/severity/associated sxs/prior Treatment) HPI Comments: Patient was informed by her partner that she should be tested for STIs. Patient currently reports herself to be asymptomatic.  LNMP; 1st week in Dec.  Patient is a 33 y.o. female presenting with STD exposure. The history is provided by the patient.  Exposure to STD This is a new problem.    Past Medical History  Diagnosis Date  . Migraines     TYPICALLY TAKES EXCEDERIN MIGRAINE  . History of chlamydia 1998  . History of gonorrhea   . Hx of syphilis 1998  . H/O candidiasis   . Hx: UTI (urinary tract infection)   . Yeast infection   . Abnormal Pap smear     Medication was treatment  . Exposure to STD 11/04/2011  . History of anemia 11/04/2011  . Anemia     Hx -during Second Pregnancy   Past Surgical History  Procedure Laterality Date  . Cesarean section    . Cesarean section  03/23/2012    Procedure: CESAREAN SECTION;  Surgeon: Kirkland HunArthur Stringer, MD;  Location: [facility];  Service: Obstetrics;  Laterality: N/A;  Repeat   Family History  Problem Relation Age of Onset  . Diabetes Father   . Hypertension Father   . Hypertension Sister   . Diabetes Sister     ADULT ONSET  . Hypertension Mother   . Heart disease Mother   . Diabetes Mother   . Thyroid disease Maternal Uncle   . Heart disease Maternal Grandmother   . Hypertension Maternal Grandmother   . Diabetes Maternal Grandmother   . Diabetes Paternal Grandmother    History  Substance Use Topics  . Smoking status: Current Every Day Smoker -- 1.00 packs/day for 15 years    Types: Cigarettes  . Smokeless tobacco: Never Used     Comment: PT COUNSELED TO TRY DECREASING AMOUT SHE SMOKES A DAY UNTIL SHE CAN EVENTUALLY QUIT  . Alcohol Use:  0.0 oz/week     Comment: "every now and then. I haven't had none lately'   OB History    Gravida Para Term Preterm AB TAB SAB Ectopic Multiple Living   4 4 4  0 0 0 0 0 0 4     Review of Systems  All other systems reviewed and are negative.   Allergies  Review of patient's allergies indicates no known allergies.  Home Medications   Prior to Admission medications   Medication Sig Start Date End Date Taking? Authorizing Provider  acetaminophen (TYLENOL) 500 MG tablet Take 500 mg by mouth every 6 (six) hours as needed. For pain    Historical Provider, MD  AMOXICILLIN PO Take by mouth.    Historical Provider, MD  calcium carbonate (TUMS - DOSED IN MG ELEMENTAL CALCIUM) 500 MG chewable tablet Chew 2 tablets by mouth at bedtime.    Historical Provider, MD  hydrochlorothiazide (HYDRODIURIL) 12.5 MG tablet Take 1 tablet (12.5 mg total) by mouth daily. 12/24/13   Massie MaroonLachina M Hollis, FNP  ibuprofen (ADVIL,MOTRIN) 800 MG tablet Take 1 tablet (800 mg total) by mouth every 8 (eight) hours as needed. Patient taking differently: Take 800 mg by mouth every 8 (eight) hours as needed for moderate pain.  12/24/13   Massie MaroonLachina M Hollis, FNP  Multiple Vitamin (  MULTIVITAMIN WITH MINERALS) TABS tablet Take 1 tablet by mouth daily.    Historical Provider, MD   BP 143/91 mmHg  Pulse 73  Temp(Src) 98.4 F (36.9 C) (Oral)  Resp 16  SpO2 99% Physical Exam  Constitutional: She is oriented to person, place, and time. She appears well-developed and well-nourished. No distress.  HENT:  Head: Normocephalic and atraumatic.  Eyes: Conjunctivae are normal.  Cardiovascular: Normal rate.   Pulmonary/Chest: Effort normal.  Genitourinary: Vagina normal and uterus normal. Pelvic exam was performed with patient supine. There is no rash, tenderness or lesion on the right labia. There is no rash, tenderness or lesion on the left labia. Cervix exhibits no motion tenderness, no discharge and no friability. Right adnexum displays  no mass, no tenderness and no fullness. Left adnexum displays no mass, no tenderness and no fullness.  Musculoskeletal: Normal range of motion.  Neurological: She is alert and oriented to person, place, and time.  Skin: Skin is warm and dry. No rash noted. No erythema.  Psychiatric: She has a normal mood and affect. Her behavior is normal.  Nursing note and vitals reviewed.   ED Course  Procedures (including critical care time) Labs Review Labs Reviewed  HIV ANTIBODY (ROUTINE TESTING)  RPR  POCT URINALYSIS DIP (DEVICE)  POCT PREGNANCY, URINE  CERVICOVAGINAL ANCILLARY ONLY    Imaging Review No results found.   MDM   1. Possible exposure to STD    [facility] 1000mg  po and ceftriaxone 250mg  IM Advised that if lab results indicate the need for additional treatment, she would be notified by phone. No sex for two weeks Repeat HIV testing in 2-3 mos. Use condoms   Mathis FareJennifer Lee H TerrilPresson, [place] 04/29/14 1501

## 2014-04-29 NOTE — ED Notes (Signed)
Patient reports her sexual partner is having unprotected sex with someone else.  Patient concerned for std

## 2014-04-29 NOTE — Discharge Instructions (Signed)
Safe Sex °Safe sex is about reducing the risk of giving or getting a sexually transmitted disease (STD). STDs are spread through sexual contact involving the genitals, mouth, or rectum. Some STDs can be cured and others cannot. Safe sex can also prevent unintended pregnancies.  °WHAT ARE SOME SAFE SEX PRACTICES? °· Limit your sexual activity to only one partner who is having sex with only you. °· Talk to your partner about his or her past partners, past STDs, and drug use. °· Use a condom every time you have sexual intercourse. This includes vaginal, oral, and anal sexual activity. Both females and males should wear condoms during oral sex. Only use latex or polyurethane condoms and water-based lubricants. Using petroleum-based lubricants or oils to lubricate a condom will weaken the condom and increase the chance that it will break. The condom should be in place from the beginning to the end of sexual activity. Wearing a condom reduces, but does not completely eliminate, your risk of getting or giving an STD. STDs can be spread by contact with infected body fluids and skin. °· Get vaccinated for hepatitis B and HPV. °· Avoid alcohol and recreational drugs, which can affect your judgment. You may forget to use a condom or participate in high-risk sex. °· For females, avoid douching after sexual intercourse. Douching can spread an infection farther into the reproductive tract. °· Check your body for signs of sores, blisters, rashes, or unusual discharge. See your health care provider if you notice any of these signs. °· Avoid sexual contact if you have symptoms of an infection or are being treated for an STD. If you or your partner has herpes, avoid sexual contact when blisters are present. Use condoms at all other times. °· If you are at risk of being infected with HIV, it is recommended that you take a prescription medicine daily to prevent HIV infection. This is called pre-exposure prophylaxis (PrEP). You are  considered at risk if: °¨ You are a man who has sex with other men (MSM). °¨ You are a heterosexual man or woman who is sexually active with more than one partner. °¨ You take drugs by injection. °¨ You are sexually active with a partner who has HIV. °· Talk with your health care provider about whether you are at high risk of being infected with HIV. If you choose to begin PrEP, you should first be tested for HIV. You should then be tested every 3 months for as long as you are taking PrEP. °· See your health care provider for regular screenings, exams, and tests for other STDs. Before having sex with a new partner, each of you should be screened for STDs and should talk about the results with each other. °WHAT ARE THE BENEFITS OF SAFE SEX?  °· There is less chance of getting or giving an STD. °· You can prevent unwanted or unintended pregnancies. °· By discussing safe sex concerns with your partner, you may increase feelings of intimacy, comfort, trust, and honesty between the two of you. °Document Released: 05/26/2004 Document Revised: 09/02/2013 Document Reviewed: 10/10/2011 °ExitCare® Patient Information ©2015 [employer]. This information is not intended to replace advice given to you by your health care provider. Make sure you discuss any questions you have with your health care provider. ° °Sexually Transmitted Disease °A sexually transmitted disease (STD) is a disease or infection that may be passed (transmitted) from person to person, usually during sexual activity. This may happen by way of saliva, semen, blood,   vaginal mucus, or urine. Common STDs include:  °· Gonorrhea.   °· Chlamydia.   °· Syphilis.   °· HIV and AIDS.   °· Genital herpes.   °· Hepatitis B and C.   °· Trichomonas.   °· Human papillomavirus (HPV).   °· Pubic lice.   °· Scabies. °· Mites. °· Bacterial vaginosis. °WHAT ARE CAUSES OF STDs? °An STD may be caused by bacteria, a virus, or parasites. STDs are often transmitted during sexual  activity if one person is infected. However, they may also be transmitted through nonsexual means. STDs may be transmitted after:  °· Sexual intercourse with an infected person.   °· Sharing sex toys with an infected person.   °· Sharing needles with an infected person or using unclean piercing or tattoo needles. °· Having intimate contact with the genitals, mouth, or rectal areas of an infected person.   °· Exposure to infected fluids during birth. °WHAT ARE THE SIGNS AND SYMPTOMS OF STDs? °Different STDs have different symptoms. Some people may not have any symptoms. If symptoms are present, they may include:  °· Painful or bloody urination.   °· Pain in the pelvis, abdomen, vagina, anus, throat, or eyes.   °· A skin rash, itching, or irritation. °· Growths, ulcerations, blisters, or sores in the genital and anal areas. °· Abnormal vaginal discharge with or without bad odor.   °· Penile discharge in men.   °· Fever.   °· Pain or bleeding during sexual intercourse.   °· Swollen glands in the groin area.   °· Yellow skin and eyes (jaundice). This is seen with hepatitis.   °· Swollen testicles. °· Infertility. °· Sores and blisters in the mouth. °HOW ARE STDs DIAGNOSED? °To make a diagnosis, your health care provider may:  °· Take a medical history.   °· Perform a physical exam.   °· Take a sample of any discharge to examine. °· Swab the throat, cervix, opening to the penis, rectum, or vagina for testing. °· Test a sample of your first morning urine.   °· Perform blood tests.   °· Perform a Pap test, if this applies.   °· Perform a colposcopy.   °· Perform a laparoscopy.   °HOW ARE STDs TREATED? ° Treatment depends on the STD. Some STDs may be treated but not cured.  °· Chlamydia, gonorrhea, trichomonas, and syphilis can be cured with antibiotic medicine.   °· Genital herpes, hepatitis, and HIV can be treated, but not cured, with prescribed medicines. The medicines lessen symptoms.   °· Genital warts from HPV can be  treated with medicine or by freezing, burning (electrocautery), or surgery. Warts may come back.   °· HPV cannot be cured with medicine or surgery. However, abnormal areas may be removed from the cervix, vagina, or vulva.   °· If your diagnosis is confirmed, your recent sexual partners need treatment. This is true even if they are symptom-free or have a negative culture or evaluation. They should not have sex until their health care providers say it is okay. °HOW CAN I REDUCE MY RISK OF GETTING AN STD? °Take these steps to reduce your risk of getting an STD: °· Use latex condoms, dental dams, and water-soluble lubricants during sexual activity. Do not use petroleum jelly or oils. °· Avoid having multiple sex partners. °· Do not have sex with someone who has other sex partners. °· Do not have sex with anyone you do not know or who is at high risk for an STD. °· Avoid risky sex practices that can break your skin. °· Do not have sex if you have open sores on your mouth or skin. °· Avoid drinking too   much alcohol or taking illegal drugs. Alcohol and drugs can affect your judgment and put you in a vulnerable position. °· Avoid engaging in oral and anal sex acts. °· Get vaccinated for HPV and hepatitis. If you have not received these vaccines in the past, talk to your health care provider about whether one or both might be right for you.   °· If you are at risk of being infected with HIV, it is recommended that you take a prescription medicine daily to prevent HIV infection. This is called pre-exposure prophylaxis (PrEP). You are considered at risk if: °¨ You are a man who has sex with other men (MSM). °¨ You are a heterosexual man or woman and are sexually active with more than one partner. °¨ You take drugs by injection. °¨ You are sexually active with a partner who has HIV. °· Talk with your health care provider about whether you are at high risk of being infected with HIV. If you choose to begin PrEP, you should first  be tested for HIV. You should then be tested every 3 months for as long as you are taking PrEP.   °WHAT SHOULD I DO IF I THINK I HAVE AN STD? °· See your health care provider.   °· Tell your sexual partner(s). They should be tested and treated for any STDs. °· Do not have sex until your health care provider says it is okay.  °WHEN SHOULD I GET IMMEDIATE MEDICAL CARE? °Contact your health care provider right away if:  °· You have severe abdominal pain. °· You are a man and notice swelling or pain in your testicles. °· You are a woman and notice swelling or pain in your vagina. °Document Released: 07/09/2002 Document Revised: 04/23/2013 Document Reviewed: 11/06/2012 °ExitCare® Patient Information ©2015 [employer]. This information is not intended to replace advice given to you by your health care provider. Make sure you discuss any questions you have with your health care provider. ° °

## 2014-04-30 ENCOUNTER — Emergency Department (INDEPENDENT_AMBULATORY_CARE_PROVIDER_SITE_OTHER)
Admission: EM | Admit: 2014-04-30 | Discharge: 2014-04-30 | Disposition: A | Discharge status: Home / Self Care | Payer: No Typology Code available for payment source | Source: Home / Self Care | Attending: Family Medicine | Admitting: Family Medicine

## 2014-04-30 ENCOUNTER — Telehealth (HOSPITAL_COMMUNITY): Payer: Self-pay | Admitting: *Deleted

## 2014-04-30 ENCOUNTER — Emergency Department (HOSPITAL_COMMUNITY)
Admission: EM | Admit: 2014-04-30 | Discharge: 2014-04-30 | Discharge status: Absent without leave | Payer: No Typology Code available for payment source | Source: Home / Self Care

## 2014-04-30 ENCOUNTER — Encounter (HOSPITAL_COMMUNITY): Payer: Self-pay | Admitting: *Deleted

## 2014-04-30 DIAGNOSIS — A539 Syphilis, unspecified: Secondary | ICD-10-CM

## 2014-04-30 DIAGNOSIS — N760 Acute vaginitis: Secondary | ICD-10-CM

## 2014-04-30 DIAGNOSIS — B9689 Other specified bacterial agents as the cause of diseases classified elsewhere: Secondary | ICD-10-CM

## 2014-04-30 LAB — HIV ANTIBODY (ROUTINE TESTING W REFLEX): HIV: NONREACTIVE

## 2014-04-30 LAB — CERVICOVAGINAL ANCILLARY ONLY
Chlamydia: NEGATIVE
NEISSERIA GONORRHEA: POSITIVE — AB

## 2014-04-30 LAB — RPR TITER: RPR Titer: 1:4 {titer} — AB

## 2014-04-30 LAB — FLUORESCENT TREPONEMAL AB(FTA)-IGG-BLD: Fluorescent Treponemal Ab, IgG: REACTIVE — AB

## 2014-04-30 LAB — RPR: RPR Ser Ql: REACTIVE — AB

## 2014-04-30 MED ORDER — PENICILLIN G BENZATHINE 1200000 UNIT/2ML IM SUSP
INTRAMUSCULAR | Status: AC
  Filled 2014-04-30: qty 4

## 2014-04-30 MED ORDER — METRONIDAZOLE 500 MG PO TABS: 500.0000 mg | ORAL_TABLET | Freq: Two times a day (BID) | ORAL | Status: DC

## 2014-04-30 MED ORDER — PENICILLIN G BENZATHINE 1200000 UNIT/2ML IM SUSP
2.4000 10*6.[IU] | Freq: Once | INTRAMUSCULAR | Status: AC
  Administered 2014-04-30: 2.4 10*6.[IU] via INTRAMUSCULAR

## 2014-04-30 NOTE — ED Notes (Signed)
GC pos., Chlamydia neg., Affirm: Candida and Trich neg., Gardnerella pos., RPR reactive, RPR titer 1:4, Fluorescent Treponemal AB reactive, HIV non-reactive.  I called pt. Pt. verified x 2 and given results. Pt. told she was adequately treated with Rocephin and Zithromax for GC  Pt. told she needs to come back for 2  Penicillin injections for syphilis and Rx. of Flagyl  for bacterial vaginosis.  Pt. said she would come in tomorrow.  Instructed no sex until 1 week after treatment.  Pt. instructed to notify her partners, no sex for 1 week and to practice safe sex. Pt. told she [facility], by appointment. Vassie MoselleYork, Shant Hence M 04/30/2014

## 2014-04-30 NOTE — Discharge Instructions (Signed)
Bacterial Vaginosis °Bacterial vaginosis is a vaginal infection that occurs when the normal balance of bacteria in the vagina is disrupted. It results from an overgrowth of certain bacteria. This is the most common vaginal infection in women of childbearing age. Treatment is important to prevent complications, especially in pregnant women, as it can cause a premature delivery. °CAUSES  °Bacterial vaginosis is caused by an increase in harmful bacteria that are normally present in smaller amounts in the vagina. Several different kinds of bacteria can cause bacterial vaginosis. However, the reason that the condition develops is not fully understood. °RISK FACTORS °Certain activities or behaviors can put you at an increased risk of developing bacterial vaginosis, including: °· Having a new sex partner or multiple sex partners. °· Douching. °· Using an intrauterine device (IUD) for contraception. °Women do not get bacterial vaginosis from toilet seats, bedding, swimming pools, or contact with objects around them. °SIGNS AND SYMPTOMS  °Some women with bacterial vaginosis have no signs or symptoms. Common symptoms include: °· Grey vaginal discharge. °· A fishlike odor with discharge, especially after sexual intercourse. °· Itching or burning of the vagina and vulva. °· Burning or pain with urination. °DIAGNOSIS  °Your health care provider will take a medical history and examine the vagina for signs of bacterial vaginosis. A sample of vaginal fluid may be taken. Your health care provider will look at this sample under a microscope to check for bacteria and abnormal cells. A vaginal pH test may also be done.  °TREATMENT  °Bacterial vaginosis may be treated with antibiotic medicines. These may be given in the form of a pill or a vaginal cream. A second round of antibiotics may be prescribed if the condition comes back after treatment.  °HOME CARE INSTRUCTIONS  °· Only take over-the-counter or prescription medicines as  directed by your health care provider. °· If antibiotic medicine was prescribed, take it as directed. Make sure you finish it even if you start to feel better. °· Do not have sex until treatment is completed. °· Tell all sexual partners that you have a vaginal infection. They should see their health care provider and be treated if they have problems, such as a mild rash or itching. °· Practice safe sex by using condoms and only having one sex partner. °SEEK MEDICAL CARE IF:  °· Your symptoms are not improving after 3 days of treatment. °· You have increased discharge or pain. °· You have a fever. °MAKE SURE YOU:  °· Understand these instructions. °· Will watch your condition. °· Will get help right away if you are not doing well or get worse. °FOR MORE INFORMATION  °Centers for Disease Control and Prevention, Division of STD Prevention: www.cdc.gov/std °American Sexual Health Association (ASHA): www.ashastd.org  °Document Released: 04/18/2005 Document Revised: 02/06/2013 Document Reviewed: 11/28/2012 °ExitCare® Patient Information ©2015 [employer]. This information is not intended to replace advice given to you by your health care provider. Make sure you discuss any questions you have with your health care provider. ° °Sexually Transmitted Disease °A sexually transmitted disease (STD) is a disease or infection that may be passed (transmitted) from person to person, usually during sexual activity. This may happen by way of saliva, semen, blood, vaginal mucus, or urine. Common STDs include:  °· Gonorrhea.   °· Chlamydia.   °· Syphilis.   °· HIV and AIDS.   °· Genital herpes.   °· Hepatitis B and C.   °· Trichomonas.   °· Human papillomavirus (HPV).   °· Pubic lice.   °· Scabies. °· Mites. °·   Bacterial vaginosis. WHAT ARE CAUSES OF STDs? An STD may be caused by bacteria, a virus, or parasites. STDs are often transmitted during sexual activity if one person is infected. However, they may also be transmitted through  nonsexual means. STDs may be transmitted after:   Sexual intercourse with an infected person.   Sharing sex toys with an infected person.   Sharing needles with an infected person or using unclean piercing or tattoo needles.  Having intimate contact with the genitals, mouth, or rectal areas of an infected person.   Exposure to infected fluids during birth. WHAT ARE THE SIGNS AND SYMPTOMS OF STDs? Different STDs have different symptoms. Some people may not have any symptoms. If symptoms are present, they may include:   Painful or bloody urination.   Pain in the pelvis, abdomen, vagina, anus, throat, or eyes.   A skin rash, itching, or irritation.  Growths, ulcerations, blisters, or sores in the genital and anal areas.  Abnormal vaginal discharge with or without bad odor.   Penile discharge in men.   Fever.   Pain or bleeding during sexual intercourse.   Swollen glands in the groin area.   Yellow skin and eyes (jaundice). This is seen with hepatitis.   Swollen testicles.  Infertility.  Sores and blisters in the mouth. HOW ARE STDs DIAGNOSED? To make a diagnosis, your health care provider may:   Take a medical history.   Perform a physical exam.   Take a sample of any discharge to examine.  Swab the throat, cervix, opening to the penis, rectum, or vagina for testing.  Test a sample of your first morning urine.   Perform blood tests.   Perform a Pap test, if this applies.   Perform a colposcopy.   Perform a laparoscopy.  HOW ARE STDs TREATED? Treatment depends on the STD. Some STDs may be treated but not cured.   Chlamydia, gonorrhea, trichomonas, and syphilis can be cured with antibiotic medicine.   Genital herpes, hepatitis, and HIV can be treated, but not cured, with prescribed medicines. The medicines lessen symptoms.   Genital warts from HPV can be treated with medicine or by freezing, burning (electrocautery), or surgery. Warts  may come back.   HPV cannot be cured with medicine or surgery. However, abnormal areas may be removed from the cervix, vagina, or vulva.   If your diagnosis is confirmed, your recent sexual partners need treatment. This is true even if they are symptom-free or have a negative culture or evaluation. They should not have sex until their health care providers say it is okay. HOW CAN I REDUCE MY RISK OF GETTING AN STD? Take these steps to reduce your risk of getting an STD:  Use latex condoms, dental dams, and water-soluble lubricants during sexual activity. Do not use petroleum jelly or oils.  Avoid having multiple sex partners.  Do not have sex with someone who has other sex partners.  Do not have sex with anyone you do not know or who is at high risk for an STD.  Avoid risky sex practices that can break your skin.  Do not have sex if you have open sores on your mouth or skin.  Avoid drinking too much alcohol or taking illegal drugs. Alcohol and drugs can affect your judgment and put you in a vulnerable position.  Avoid engaging in oral and anal sex acts.  Get vaccinated for HPV and hepatitis. If you have not received these vaccines in the past, talk to your  health care provider about whether one or both might be right for you.   If you are at risk of being infected with HIV, it is recommended that you take a prescription medicine daily to prevent HIV infection. This is called pre-exposure prophylaxis (PrEP). You are considered at risk if:  You are a man who has sex with other men (MSM).  You are a heterosexual man or woman and are sexually active with more than one partner.  You take drugs by injection.  You are sexually active with a partner who has HIV.  Talk with your health care provider about whether you are at high risk of being infected with HIV. If you choose to begin PrEP, you should first be tested for HIV. You should then be tested every 3 months for as long as you  are taking PrEP.  WHAT SHOULD I DO IF I THINK I HAVE AN STD?  See your health care provider.   Tell your sexual partner(s). They should be tested and treated for any STDs.  Do not have sex until your health care provider says it is okay. WHEN SHOULD I GET IMMEDIATE MEDICAL CARE? Contact your health care provider right away if:   You have severe abdominal pain.  You are a man and notice swelling or pain in your testicles.  You are a woman and notice swelling or pain in your vagina. Document Released: 07/09/2002 Document Revised: 04/23/2013 Document Reviewed: 11/06/2012 [facility] Patient Information 2015 [place], [place]. This information is not intended to replace advice given to you by your health care provider. Make sure you discuss any questions you have with your health care provider.  Syphilis Syphilis is an infectious disease. It can cause serious complications if left untreated.  CAUSES  Syphilis is caused by a type of bacteria called Treponema pallidum. It is most commonly spread through sexual contact. Syphilis may also spread to a fetus through the blood of the mother.  SIGNS AND SYMPTOMS Symptoms vary depending on the stage of the disease. Some symptoms may disappear without treatment. However, this does not mean that the infection is gone. One form of syphilis (called latent syphilis) has no symptoms.  Primary Syphilis  Painless sores (chancres) in and around the genital organs and mouth.  Swollen lymph nodes near the sores. Secondary Syphilis  A rash or sores over any portion of the body, including the palms of the hands and soles of the feet.  Fever.  Headache.  Sore throat.  Swollen lymph nodes.  New sores in the mouth or on the genitals.  Feeling generally ill.  Having pain in the joints. Tertiary Syphilis The third stage of syphilis involves severe damage to different organs in the body, such as the brain, spinal cord, and heart. Signs and symptoms  may include:   Dementia.  Personality and mood changes.  Difficulty walking.  Heart failure.  Fainting.  Enlargement (aneurysm) of the aorta.  Tumors of the skin, bones, or liver.  Muscle weakness.  Sudden "lightning" pains, numbness, or tingling.  Problems with coordination.  Vision changes. DIAGNOSIS   A physical exam will be done.  Blood tests will be done to confirm the diagnosis.  If the disease is in the first or second stages, a fluid (drainage) sample from a sore or rash may be examined under a microscope to detect the disease-causing bacteria.  Fluid around the spine may need to be examined to detect brain damage or inflammation of the brain lining (meningitis).  If the disease is in the third stage, X-rays, CT scans, MRIs, echocardiograms, ultrasounds, or cardiac catheterization may also be done to detect disease of the heart, aorta, or brain. TREATMENT  Syphilis can be cured with antibiotic medicine if a diagnosis is made early. During the first day of treatment, you may experience fever, chills, headache, nausea, or aching all over your body. This is a normal reaction to the antibiotics.  HOME CARE INSTRUCTIONS   Take your antibiotic medicine as directed by your health care provider. Finish the antibiotic even if you start to feel better. Incomplete treatment will put you at risk for continued infection and could be life threatening.  Take medicines only as directed by your health care provider.  Do not have sexual intercourse until your treatment is completed or as directed by your health care provider.  Inform your recent sexual partners that you were diagnosed with syphilis. They need to seek care and treatment, even if they have no symptoms. It is necessary that all your sexual partners be tested for infection and treated if they have the disease.  Keep all follow-up visits as directed by your health care provider. It is important to keep all your  appointments.  If your test results are not ready during your visit, make an appointment with your health care provider to find out the results. Do not assume everything is normal if you have not heard from your health care provider or the medical facility. It is your responsibility to get your test results. SEEK MEDICAL CARE IF:  You continue to have any of the following 24 hours after beginning treatment:  Fever.  Chills.  Headache.  Nausea.  Aching all over your body.  You have symptoms of an allergic reaction to medicine, such as:  Chills.  A headache.  Light-headedness.  A new rash (especially hives).  Difficulty breathing. MAKE SURE YOU:   Understand these instructions.  Will watch your condition.  Will get help right away if you are not doing well or get worse. Document Released: 02/06/2013 Document Revised: 09/02/2013 Document Reviewed: 02/06/2013 [facility] Patient Information 2015 [place]Care, [place]. This information is not intended to replace advice given to you by your health care provider. Make sure you discuss any questions you have with your health care provider.

## 2014-04-30 NOTE — ED Notes (Signed)
Pt. came back and checked back in. States she had to drop her child off.  Pt. was called back to be treated for Syphilis and Gardnerella.  [facility] adequately [facility].

## 2014-04-30 NOTE — ED Provider Notes (Signed)
CSN: [id number]     Arrival date & time 04/30/14  1902 History   First MD Initiated Contact with Patient 04/30/14 1903     Chief Complaint  Patient presents with  . Exposure to STD   (Consider location/radiation/quality/duration/timing/severity/associated sxs/prior Treatment) HPI      33 year old female presents for STD treatment. She was seen here yesterday and treated for Chlamydia and gonorrhea. Her test came back positive for gonorrhea, Gardnerella, and syphilis. She is here today for a shot of penicillin and her prescription for Flagyl. Denies any new symptoms. No abdominal pain, pelvic pain, or fever  Past Medical History  Diagnosis Date  . Migraines     TYPICALLY TAKES EXCEDERIN MIGRAINE  . History of chlamydia 1998  . History of gonorrhea   . Hx of syphilis 1998  . H/O candidiasis   . Hx: UTI (urinary tract infection)   . Yeast infection   . Abnormal Pap smear     Medication was treatment  . Exposure to STD 11/04/2011  . History of anemia 11/04/2011  . Anemia     Hx -during Second Pregnancy   Past Surgical History  Procedure Laterality Date  . Cesarean section  2003, 2004, 2008,2013    x 4  . Cesarean section  03/23/2012    Procedure: CESAREAN SECTION;  Surgeon: Kirkland HunArthur Stringer, MD;  Location: [facility];  Service: Obstetrics;  Laterality: N/A;  Repeat   Family History  Problem Relation Age of Onset  . Diabetes Father   . Hypertension Father   . Hypertension Sister   . Diabetes Sister     ADULT ONSET  . Hypertension Mother   . Heart disease Mother   . Diabetes Mother   . Thyroid disease Maternal Uncle   . Heart disease Maternal Grandmother   . Hypertension Maternal Grandmother   . Diabetes Maternal Grandmother   . Diabetes Paternal Grandmother    History  Substance Use Topics  . Smoking status: Current Every Day Smoker -- 1.00 packs/day for 15 years    Types: Cigarettes  . Smokeless tobacco: Never Used     Comment: PT COUNSELED TO TRY DECREASING AMOUT SHE  SMOKES A DAY UNTIL SHE CAN EVENTUALLY QUIT  . Alcohol Use: 0.0 oz/week     Comment: "every now and then. I haven't had none lately'   OB History    Gravida Para Term Preterm AB TAB SAB Ectopic Multiple Living   4 4 4  0 0 0 0 0 0 4     Review of Systems  Constitutional: Negative for fever and chills.  Gastrointestinal: Negative for nausea, vomiting and abdominal pain.  Genitourinary: Negative for dysuria, urgency, frequency, flank pain, vaginal bleeding, vaginal discharge, vaginal pain and pelvic pain.  Musculoskeletal: Negative for myalgias and arthralgias.  Skin: Negative for rash.  Neurological: Negative for light-headedness.  All other systems reviewed and are negative.   Allergies  Review of patient's allergies indicates no known allergies.  Home Medications   Prior to Admission medications   Medication Sig Start Date End Date Taking? Authorizing Provider  acetaminophen (TYLENOL) 500 MG tablet Take 500 mg by mouth every 6 (six) hours as needed. For pain   Yes Historical Provider, MD  AMOXICILLIN PO Take by mouth.   Yes Historical Provider, MD  hydrochlorothiazide (HYDRODIURIL) 12.5 MG tablet Take 1 tablet (12.5 mg total) by mouth daily. 12/24/13  Yes Massie MaroonLachina M Hollis, FNP  calcium carbonate (TUMS - DOSED IN MG ELEMENTAL CALCIUM) 500 MG chewable tablet Chew  2 tablets by mouth at bedtime.    Historical Provider, MD  ibuprofen (ADVIL,MOTRIN) 800 MG tablet Take 1 tablet (800 mg total) by mouth every 8 (eight) hours as needed. Patient taking differently: Take 800 mg by mouth every 8 (eight) hours as needed for moderate pain.  12/24/13   Massie MaroonLachina M Hollis, FNP  metroNIDAZOLE (FLAGYL) 500 MG tablet Take 1 tablet (500 mg total) by mouth 2 (two) times daily. 04/30/14   Graylon GoodZachary H Daun Rens, PA-C  Multiple Vitamin (MULTIVITAMIN WITH MINERALS) TABS tablet Take 1 tablet by mouth daily.    Historical Provider, MD   BP 144/91 mmHg  Pulse 73  Temp(Src) 98.4 F (36.9 C) (Oral)  Resp 18  SpO2 100%   LMP 04/02/2014 Physical Exam  Constitutional: She is oriented to person, place, and time. Vital signs are normal. She appears well-developed and well-nourished. No distress.  HENT:  Head: Normocephalic and atraumatic.  Pulmonary/Chest: Effort normal. No respiratory distress.  Neurological: She is alert and oriented to person, place, and time. She has normal strength. Coordination normal.  Skin: Skin is warm and dry. No rash noted. She is not diaphoretic.  Psychiatric: She has a normal mood and affect. Judgment normal.  Nursing note and vitals reviewed.   ED Course  Procedures (including critical care time) Labs Review Labs Reviewed - No data to display  Imaging Review No results found.   MDM   1. Syphilis   2. Bacterial vaginosis    Treated with 2.4 milliunits of Bicillin LA here and discharged with metronidazole 500 twice a day for week to take if she starts to get sxs of discharge and vaginal odor.     Meds ordered this encounter  Medications  . penicillin g benzathine (BICILLIN LA) 1200000 UNIT/2ML injection 2.4 Million Units    Sig:     Order Specific Question:  Antibiotic Indication:    Answer:  Syphilis  . metroNIDAZOLE (FLAGYL) 500 MG tablet    Sig: Take 1 tablet (500 mg total) by mouth 2 (two) times daily.    Dispense:  14 tablet    Refill:  0    Order Specific Question:  Supervising Provider    Answer:  Lorenz CoasterKELLER, DAVID C [[id number]]       Graylon GoodZachary H Brandn Mcgath, PA-C 04/30/14 [phone]

## 2014-04-30 NOTE — ED Notes (Signed)
Pt. left before being brought to the room.  EMT said he looked for her in the parking lot.

## 2014-09-04 ENCOUNTER — Ambulatory Visit: Payer: Self-pay

## 2014-10-08 ENCOUNTER — Emergency Department (HOSPITAL_COMMUNITY): Payer: Self-pay

## 2014-10-08 ENCOUNTER — Emergency Department (INDEPENDENT_AMBULATORY_CARE_PROVIDER_SITE_OTHER): Payer: Self-pay

## 2014-10-08 ENCOUNTER — Emergency Department (INDEPENDENT_AMBULATORY_CARE_PROVIDER_SITE_OTHER)
Admission: EM | Admit: 2014-10-08 | Discharge: 2014-10-08 | Disposition: A | Discharge status: Home / Self Care | Payer: Self-pay | Source: Home / Self Care | Attending: Family Medicine | Admitting: Family Medicine

## 2014-10-08 ENCOUNTER — Encounter (HOSPITAL_COMMUNITY): Payer: Self-pay | Admitting: Emergency Medicine

## 2014-10-08 DIAGNOSIS — S9032XA Contusion of left foot, initial encounter: Secondary | ICD-10-CM

## 2014-10-08 DIAGNOSIS — S0191XA Laceration without foreign body of unspecified part of head, initial encounter: Secondary | ICD-10-CM

## 2014-10-08 DIAGNOSIS — T148 Other injury of unspecified body region: Secondary | ICD-10-CM

## 2014-10-08 DIAGNOSIS — Z654 Victim of crime and terrorism: Secondary | ICD-10-CM

## 2014-10-08 DIAGNOSIS — W503XXA Accidental bite by another person, initial encounter: Secondary | ICD-10-CM

## 2014-10-08 DIAGNOSIS — IMO0002 Reserved for concepts with insufficient information to code with codable children: Secondary | ICD-10-CM

## 2014-10-08 MED ORDER — IBUPROFEN 800 MG PO TABS
ORAL_TABLET | ORAL | Status: AC
  Filled 2014-10-08: qty 1

## 2014-10-08 MED ORDER — HYDROCODONE-ACETAMINOPHEN 5-325 MG PO TABS: 1.0000 | ORAL_TABLET | Freq: Once | ORAL | Status: DC

## 2014-10-08 MED ORDER — AMOXICILLIN-POT CLAVULANATE 875-125 MG PO TABS: 1.0000 | ORAL_TABLET | Freq: Two times a day (BID) | ORAL | Status: DC

## 2014-10-08 MED ORDER — IBUPROFEN 800 MG PO TABS
800.0000 mg | ORAL_TABLET | Freq: Once | ORAL | Status: AC
Start: 2014-10-08 — End: 2014-10-08
  Administered 2014-10-08: 800 mg via ORAL

## 2014-10-08 MED ORDER — NAPROXEN 375 MG PO TABS: 375.0000 mg | ORAL_TABLET | Freq: Two times a day (BID) | ORAL | Status: DC
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
## 2014-10-08 NOTE — ED Notes (Signed)
After completing the left hand and left foot x-rays, patient stated that she did not want the right foot x-rayed. She refused, stated that it (the right foot) did not hurt
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
## 2014-11-13 ENCOUNTER — Inpatient Hospital Stay (HOSPITAL_COMMUNITY)
Admission: AD | Admit: 2014-11-13 | Discharge: 2014-11-14 | Disposition: A | Discharge status: Home / Self Care | Payer: Self-pay | Source: Ambulatory Visit | Attending: Obstetrics and Gynecology | Admitting: Obstetrics and Gynecology

## 2014-11-13 ENCOUNTER — Encounter (HOSPITAL_COMMUNITY): Payer: Self-pay | Admitting: *Deleted

## 2014-11-13 DIAGNOSIS — Z202 Contact with and (suspected) exposure to infections with a predominantly sexual mode of transmission: Secondary | ICD-10-CM | POA: Insufficient documentation

## 2014-11-13 DIAGNOSIS — F17210 Nicotine dependence, cigarettes, uncomplicated: Secondary | ICD-10-CM | POA: Insufficient documentation

## 2014-11-13 DIAGNOSIS — Z7251 High risk heterosexual behavior: Secondary | ICD-10-CM

## 2014-11-13 LAB — WET PREP, GENITAL
Trich, Wet Prep: NONE SEEN
WBC, Wet Prep HPF POC: NONE SEEN
YEAST WET PREP: NONE SEEN

## 2014-11-13 LAB — POCT PREGNANCY, URINE: Preg Test, Ur: NEGATIVE

## 2014-11-13 MED ORDER — CEFTRIAXONE SODIUM 250 MG IJ SOLR
250.0000 mg | Freq: Once | INTRAMUSCULAR | Status: AC
  Administered 2014-11-13: 250 mg via INTRAMUSCULAR
  Filled 2014-11-13: qty 250

## 2014-11-13 MED ORDER — AZITHROMYCIN 250 MG PO TABS
1000.0000 mg | ORAL_TABLET | Freq: Once | ORAL | Status: AC
  Administered 2014-11-13: 1000 mg via ORAL
  Filled 2014-11-13: qty 4

## 2014-11-13 NOTE — Discharge Instructions (Signed)
Chlamydia Chlamydia is an infection. It is spread through sexual contact. Chlamydia can be in different areas of the body. These areas include the cervix, urethra, throat, or rectum. You may not know you have chlamydia because many people never develop the symptoms. Chlamydia is not difficult to treat once you know you have it. However, if it is left untreated, chlamydia can lead to more serious health problems.  CAUSES  Chlamydia is caused by bacteria. It is a sexually transmitted disease. It is passed from an infected partner during intimate contact. This contact could be with the genitals, mouth, or rectal area. Chlamydia can also be passed from mothers to babies during birth. SIGNS AND SYMPTOMS  There may not be any symptoms. This is often the case early in the infection. If symptoms develop, they may include:  Mild pain and discomfort when urinating.  Redness, soreness, and swelling (inflammation) of the rectum.  Vaginal discharge.  Painful intercourse.  Abdominal pain.  Bleeding between menstrual periods. DIAGNOSIS  To diagnose this infection, your health care provider will do a pelvic exam. Cultures will be taken of the vagina, cervix, urine, and possibly the rectum to verify the diagnosis.  TREATMENT You will be given antibiotic medicines. If you are pregnant, certain types of antibiotics will need to be avoided. Any sexual partners should also be treated, even if they do not show symptoms.  HOME CARE INSTRUCTIONS   Take your antibiotic medicine as directed by your health care provider. Finish the antibiotic even if you start to feel better.  Take medicines only as directed by your health care provider.  Inform any sexual partners about the infection. They should also be treated.  Do not have sexual contact until your health care provider tells you it is okay.  Get plenty of rest.  Eat a well-balanced diet.  Drink enough fluids to keep your urine clear or pale  yellow.  Keep all follow-up visits as directed by your health care provider. SEEK MEDICAL CARE IF:  You have painful urination.  You have abdominal pain.  You have vaginal discharge.  You have painful sexual intercourse.  You have bleeding between periods and after sex.  You have a fever. SEEK IMMEDIATE MEDICAL CARE IF:   You experience nausea or vomiting.  You experience excessive sweating (diaphoresis).  You have difficulty swallowing. MAKE SURE YOU:   Understand these instructions.  Will watch your condition.  Will get help right away if you are not doing well or get worse. Document Released: 01/26/2005 Document Revised: 09/02/2013 Document Reviewed: 12/24/2012 [facility] Patient Information 2015 [place], [place]. This information is not intended to replace advice given to you by your health care provider. Make sure you discuss any questions you have with your health care provider. Gonorrhea Gonorrhea is an infection that can cause serious problems. If left untreated, the infection may:   Damage the female or female organs.   Cause women to be unable to have children (sterility).   Harm a fetus if the infected woman is pregnant.  It is important to get treatment for gonorrhea as soon as possible. It is also necessary that all your sexual partners be tested for the infection.  CAUSES  Gonorrhea is caused by bacteria called Neisseria gonorrhoeae. The infection is spread from person to person, usually by sexual contact (such as by anal, vaginal, or oral means). A newborn can contract the infection from his or her mother during birth.  SYMPTOMS  Some people with gonorrhea do not have  not have symptoms. Symptoms may be different in females and males.  Females The most common symptoms are:   Pain in the lower abdomen.   Fever with or without chills.  Other symptoms include:   Abnormal vaginal discharge.   Painful intercourse.   Burning or itching of the vagina or  lips of the vagina.   Abnormal vaginal bleeding.   Pain when urinating.   Long-lasting (chronic) pain in the lower abdomen, especially during menstruation or intercourse.   Inability to become pregnant.   Going into premature labor.   Irritation, pain, bleeding, or discharge from the rectum. This may occur if the infection was spread by anal sex.   Sore throat or swollen lymph nodes in the neck. This may occur if the infection was spread by oral sex.  Males The most common symptoms are:   Discharge from the penis.   Pain or burning during urination.   Pain or swelling in the testicles. Other symptoms may include:   Irritation, pain, bleeding, or discharge from the rectum. This may occur if the infection was spread by anal sex.   Sore throat, fever, or swollen lymph nodes in the neck. This may occur if the infection was spread by oral sex.  DIAGNOSIS  A diagnosis is made after a physical exam is done and a sample of discharge is examined under a microscope for the presence of the bacteria. The discharge may be taken from the urethra, cervix, throat, or rectum.  TREATMENT  Gonorrhea is treated with antibiotic medicines. It is important for treatment to begin as soon as possible. Early treatment may prevent some problems from developing.  HOME CARE INSTRUCTIONS   Take medicines only as directed by your health care provider.   Take your antibiotic medicine as directed by your health care provider. Finish the antibiotic even if you start to feel better. Incomplete treatment will put you at risk for continued infection.   Do not have sex until treatment is complete or as directed by your health care provider.   Keep all follow-up visits as directed by your health care provider.   Not all test results are available during your visit. If your test results are not back during the visit, make an appointment with your health care provider to find out the results. Do  not assume everything is normal if you have not heard from your health care provider or the medical facility. It is your responsibility to get your test results.  If you test positive for gonorrhea, inform your recent sexual partners. They need to be checked for gonorrhea even if they do not have symptoms. They may need treatment, even if they test negative for gonorrhea.  SEEK MEDICAL CARE IF:   You develop any bad reaction to the medicine you were prescribed. This may include:   A rash.   Nausea.   Vomiting.   Diarrhea.   Your symptoms do not improve after a few days of taking antibiotics.   Your symptoms get worse.   You develop increased pain, such as in the testicles (for males) or in the abdomen (for females).  You have a fever. MAKE SURE YOU:   Understand these instructions.  Will watch your condition.  Will get help right away if you are not doing well or get worse. Document Released: 04/15/2000 Document Revised: 09/02/2013 Document Reviewed: 10/24/2012 [employer] Patient Information 2015 [employer], [employer]. This information is not intended to replace advice given to you by your health care   sure you discuss any questions you have with your health care provider.

## 2014-11-13 NOTE — MAU Note (Signed)
Pt complaining of sharp pains in lower abd, on and off. Some white discharge, denies bleeding. Denies NVD
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
## 2014-11-13 NOTE — MAU Provider Note (Signed)
History     CSN: [id number]  Arrival date and time: 11/13/14 2228   First Provider Initiated Contact with Patient 11/13/14 2325      No chief complaint on file.  HPI Comments: Anita Fuentes is a 34 y.o. [phone] who presents today for STI treatment. She states that she has had abdominal pain for about week. She got a letter from her partner, and was told she needed "to get two shots". He is currently in jail, and no other information was provided to her. She would like treatment today.   Abdominal Pain This is a new problem. The current episode started in the past 7 days. The onset quality is gradual. The problem occurs intermittently. The problem has been unchanged. The pain is located in the suprapubic region. The pain is at a severity of 0/10 (none currently. ). The quality of the pain is cramping. The abdominal pain does not radiate. Pertinent negatives include no constipation, diarrhea, dysuria, fever, frequency, nausea or vomiting. Nothing aggravates the pain. The pain is relieved by nothing. She has tried nothing for the symptoms.     Past Medical History  Diagnosis Date  . Migraines     TYPICALLY TAKES EXCEDERIN MIGRAINE  . History of chlamydia 1998  . History of gonorrhea   . Hx of syphilis 1998  . H/O candidiasis   . Hx: UTI (urinary tract infection)   . Yeast infection   . Abnormal Pap smear     Medication was treatment  . Exposure to STD 11/04/2011  . History of anemia 11/04/2011  . Anemia     Hx -during Second Pregnancy    Past Surgical History  Procedure Laterality Date  . Cesarean section  2003, 2004, 2008,2013    x 4  . Cesarean section  03/23/2012    Procedure: CESAREAN SECTION;  Surgeon: Kirkland Hun, MD;  Location: [facility];  Service: Obstetrics;  Laterality: N/A;  Repeat    Family History  Problem Relation Age of Onset  . Diabetes Father   . Hypertension Father   . Hypertension Sister   . Diabetes Sister     ADULT ONSET  . Hypertension Mother   .  Heart disease Mother   . Diabetes Mother   . Thyroid disease Maternal Uncle   . Heart disease Maternal Grandmother   . Hypertension Maternal Grandmother   . Diabetes Maternal Grandmother   . Diabetes Paternal Grandmother     History  Substance Use Topics  . Smoking status: Current Every Day Smoker -- 1.00 packs/day for 15 years    Types: Cigarettes  . Smokeless tobacco: Never Used     Comment: PT COUNSELED TO TRY DECREASING AMOUT SHE SMOKES A DAY UNTIL SHE CAN EVENTUALLY QUIT  . Alcohol Use: 0.0 oz/week     Comment: "every now and then. I haven't had none lately'    Allergies: No Known Allergies  Prescriptions prior to admission  Medication Sig Dispense Refill Last Dose  . acetaminophen (TYLENOL) 500 MG tablet Take 500 mg by mouth every 6 (six) hours as needed. For pain   Unknown at Unknown time  . AMOXICILLIN PO Take by mouth.   Unknown at Unknown time  . amoxicillin-clavulanate (AUGMENTIN) 875-125 MG per tablet Take 1 tablet by mouth every 12 (twelve) hours. 14 tablet 0   . calcium carbonate (TUMS - DOSED IN MG ELEMENTAL CALCIUM) 500 MG chewable tablet Chew 2 tablets by mouth at bedtime.   Unknown at Unknown time  .  hydrochlorothiazide (HYDRODIURIL) 12.5 MG tablet Take 1 tablet (12.5 mg total) by mouth daily. 90 tablet 1 Unknown at Unknown time  . metroNIDAZOLE (FLAGYL) 500 MG tablet Take 1 tablet (500 mg total) by mouth 2 (two) times daily. 14 tablet 0 Unknown at Unknown time  . Multiple Vitamin (MULTIVITAMIN WITH MINERALS) TABS tablet Take 1 tablet by mouth daily.   Unknown at Unknown time  . naproxen (NAPROSYN) 375 MG tablet Take 1 tablet (375 mg total) by mouth 2 (two) times daily. 20 tablet 0     Review of Systems  Constitutional: Negative for fever.  Gastrointestinal: Positive for abdominal pain. Negative for nausea, vomiting, diarrhea and constipation.  Genitourinary: Negative for dysuria, urgency and frequency.   Physical Exam   Blood pressure 145/89, pulse 78,  temperature 98.1 F (36.7 C), resp. rate 20, last menstrual period 10/03/2014.  Physical Exam  Nursing note and vitals reviewed. Constitutional: She is oriented to person, place, and time. She appears well-developed and well-nourished. No distress.  HENT:  Head: Normocephalic.  Cardiovascular: Normal rate.   Respiratory: Effort normal and breath sounds normal.  GI: Soft. Bowel sounds are normal. There is no tenderness.  Neurological: She is alert and oriented to person, place, and time.  Skin: Skin is warm and dry.  Psychiatric: She has a normal mood and affect.    MAU Course  Procedures  MDM Patient treated in MAU with 1g azithromycin PO and 250mg  rocephin IM  Assessment and Plan   1. Contact with and suspected exposure to infections with predominantly sexual mode of transmission   2. Risk for sexually transmitted disease    DC home Treated here in MAU GC/CT pending  RX: none  Return to MAU as needed FU with OB as planned  Follow-up Information    [facility] [place]   Why:  As needed   Contact information:   [address] [facility] [place] [id number] [phone]        Tawnya CrookHogan, Deyton Ellenbecker Donovan 11/13/2014, 11:26 PM

## 2014-11-14 LAB — URINALYSIS, ROUTINE W REFLEX MICROSCOPIC
BILIRUBIN URINE: NEGATIVE
Glucose, UA: NEGATIVE mg/dL
HGB URINE DIPSTICK: NEGATIVE
Ketones, ur: 15 mg/dL — AB
Leukocytes, UA: NEGATIVE
NITRITE: NEGATIVE
Protein, ur: NEGATIVE mg/dL
SPECIFIC GRAVITY, URINE: 1.02 (ref 1.005–1.030)
UROBILINOGEN UA: 0.2 mg/dL (ref 0.0–1.0)
pH: 5.5 (ref 5.0–8.0)

## 2014-11-14 LAB — GC/CHLAMYDIA PROBE AMP (~~LOC~~) NOT AT ARMC
CHLAMYDIA, DNA PROBE: NEGATIVE
NEISSERIA GONORRHEA: NEGATIVE

## 2014-11-14 LAB — HIV ANTIBODY (ROUTINE TESTING W REFLEX): HIV Screen 4th Generation wRfx: NONREACTIVE

## 2014-12-01 ENCOUNTER — Ambulatory Visit: Payer: Self-pay | Attending: Internal Medicine

## 2014-12-03 ENCOUNTER — Encounter (HOSPITAL_COMMUNITY): Payer: Self-pay | Admitting: *Deleted

## 2014-12-03 ENCOUNTER — Emergency Department (HOSPITAL_COMMUNITY): Payer: Self-pay

## 2014-12-03 ENCOUNTER — Emergency Department (HOSPITAL_COMMUNITY)
Admission: EM | Admit: 2014-12-03 | Discharge: 2014-12-03 | Disposition: A | Discharge status: Home / Self Care | Payer: Self-pay | Attending: Emergency Medicine | Admitting: Emergency Medicine

## 2014-12-03 DIAGNOSIS — M545 Low back pain, unspecified: Secondary | ICD-10-CM

## 2014-12-03 DIAGNOSIS — Z8619 Personal history of other infectious and parasitic diseases: Secondary | ICD-10-CM | POA: Insufficient documentation

## 2014-12-03 DIAGNOSIS — Z791 Long term (current) use of non-steroidal anti-inflammatories (NSAID): Secondary | ICD-10-CM | POA: Insufficient documentation

## 2014-12-03 DIAGNOSIS — Z87440 Personal history of urinary (tract) infections: Secondary | ICD-10-CM | POA: Insufficient documentation

## 2014-12-03 DIAGNOSIS — Z720 Tobacco use: Secondary | ICD-10-CM | POA: Insufficient documentation

## 2014-12-03 DIAGNOSIS — G43909 Migraine, unspecified, not intractable, without status migrainosus: Secondary | ICD-10-CM | POA: Insufficient documentation

## 2014-12-03 DIAGNOSIS — Z862 Personal history of diseases of the blood and blood-forming organs and certain disorders involving the immune mechanism: Secondary | ICD-10-CM | POA: Insufficient documentation

## 2014-12-03 DIAGNOSIS — Z3202 Encounter for pregnancy test, result negative: Secondary | ICD-10-CM | POA: Insufficient documentation

## 2014-12-03 DIAGNOSIS — Z79899 Other long term (current) drug therapy: Secondary | ICD-10-CM | POA: Insufficient documentation

## 2014-12-03 LAB — URINALYSIS, ROUTINE W REFLEX MICROSCOPIC
Bilirubin Urine: NEGATIVE
Glucose, UA: NEGATIVE mg/dL
HGB URINE DIPSTICK: NEGATIVE
Ketones, ur: NEGATIVE mg/dL
LEUKOCYTES UA: NEGATIVE
Nitrite: NEGATIVE
PH: 7 (ref 5.0–8.0)
Protein, ur: NEGATIVE mg/dL
Specific Gravity, Urine: 1.015 (ref 1.005–1.030)
Urobilinogen, UA: 0.2 mg/dL (ref 0.0–1.0)

## 2014-12-03 LAB — PREGNANCY, URINE: Preg Test, Ur: NEGATIVE

## 2014-12-03 MED ORDER — METHOCARBAMOL 500 MG PO TABS: 500.0000 mg | ORAL_TABLET | Freq: Two times a day (BID) | ORAL | Status: DC

## 2014-12-03 MED ORDER — HYDROCODONE-ACETAMINOPHEN 5-325 MG PO TABS
2.0000 | ORAL_TABLET | Freq: Once | ORAL | Status: AC
  Administered 2014-12-03: 2 via ORAL
  Filled 2014-12-03: qty 2

## 2014-12-03 MED ORDER — DICLOFENAC SODIUM 75 MG PO TBEC: 75.0000 mg | DELAYED_RELEASE_TABLET | Freq: Two times a day (BID) | ORAL | Status: DC

## 2014-12-03 MED ORDER — HYDROCODONE-ACETAMINOPHEN 5-325 MG PO TABS: 2.0000 | ORAL_TABLET | ORAL | Status: DC | PRN

## 2014-12-03 NOTE — ED Notes (Signed)
Pt made aware of need of urine specimen; pt given specimen cup and pt is up ambulatory to the bathroom at this time to attempt collection

## 2014-12-03 NOTE — Discharge Instructions (Signed)
Back Pain, Adult Low back pain is very common. About 1 in 5 people have back pain.The cause of low back pain is rarely dangerous. The pain often gets better over time.About half of people with a sudden onset of back pain feel better in just 2 weeks. About 8 in 10 people feel better by 6 weeks.  CAUSES Some common causes of back pain include:  Strain of the muscles or ligaments supporting the spine.  Wear and tear (degeneration) of the spinal discs.  Arthritis.  Direct injury to the back. DIAGNOSIS Most of the time, the direct cause of low back pain is not known.However, back pain can be treated effectively even when the exact cause of the pain is unknown.Answering your caregiver's questions about your overall health and symptoms is one of the most accurate ways to make sure the cause of your pain is not dangerous. If your caregiver needs more information, he or she may order lab work or imaging tests (X-rays or MRIs).However, even if imaging tests show changes in your back, this usually does not require surgery. HOME CARE INSTRUCTIONS For many people, back pain returns.Since low back pain is rarely dangerous, it is often a condition that people can learn to manageon their own.   Remain active. It is stressful on the back to sit or stand in one place. Do not sit, drive, or stand in one place for more than 30 minutes at a time. Take short walks on level surfaces as soon as pain allows.Try to increase the length of time you walk each day.  Do not stay in bed.Resting more than 1 or 2 days can delay your recovery.  Do not avoid exercise or work.Your body is made to move.It is not dangerous to be active, even though your back may hurt.Your back will likely heal faster if you return to being active before your pain is gone.  Pay attention to your body when you bend and lift. Many people have less discomfortwhen lifting if they bend their knees, keep the load close to their bodies,and  avoid twisting. Often, the most comfortable positions are those that put less stress on your recovering back.  Find a comfortable position to sleep. Use a firm mattress and lie on your side with your knees slightly bent. If you lie on your back, put a pillow under your knees.  Only take over-the-counter or prescription medicines as directed by your caregiver. Over-the-counter medicines to reduce pain and inflammation are often the most helpful.Your caregiver may prescribe muscle relaxant drugs.These medicines help dull your pain so you can more quickly return to your normal activities and healthy exercise.  Put ice on the injured area.  Put ice in a plastic bag.  Place a towel between your skin and the bag.  Leave the ice on for 15-20 minutes, 03-04 times a day for the first 2 to 3 days. After that, ice and heat may be alternated to reduce pain and spasms.  Ask your caregiver about trying back exercises and gentle massage. This may be of some benefit.  Avoid feeling anxious or stressed.Stress increases muscle tension and can worsen back pain.It is important to recognize when you are anxious or stressed and learn ways to manage it.Exercise is a great option. SEEK MEDICAL CARE IF:  You have pain that is not relieved with rest or medicine.  You have pain that does not improve in 1 week.  You have new symptoms.  You are generally not feeling well. SEEK   IMMEDIATE MEDICAL CARE IF:   You have pain that radiates from your back into your legs.  You develop new bowel or bladder control problems.  You have unusual weakness or numbness in your arms or legs.  You develop nausea or vomiting.  You develop abdominal pain.  You feel faint. Document Released: 04/18/2005 Document Revised: 10/18/2011 Document Reviewed: 08/20/2013 ExitCare Patient Information 2015 [employer]. This information is not intended to replace advice given to you by your health care provider. Make sure you  discuss any questions you have with your health care provider.  

## 2014-12-03 NOTE — ED Notes (Signed)
Pt taken to xray 

## 2014-12-03 NOTE — ED Provider Notes (Signed)
CSN: [id number]     Arrival date & time 12/03/14  4098 History  This chart was scribed for non-physician practitioner, Langston Masker, PA-C, working with Blane Ohara, MD by Charline Bills, ED Scribe. This patient was seen in room TR09C/TR09C and the patient's care was started at 9:23 AM.   Chief Complaint  Patient presents with  . Back Pain   The history is provided by the patient. No language interpreter was used.   HPI Comments: Anita Fuentes is a 34 y.o. female who presents to the Emergency Department complaining of gradually worsening back pain onset this morning. Pt states that she has had mild back pain for a few weeks but back pain worsened this morning when she bent over to do laundry. Pt denies heavy lifting or injury. She reports constant left-sided back pain that radiates into her left thigh and is exacerbated with movement. Pt denies dysuria and any other urinary symptoms. No known medical allergies.   Past Medical History  Diagnosis Date  . Migraines     TYPICALLY TAKES EXCEDERIN MIGRAINE  . History of chlamydia 1998  . History of gonorrhea   . Hx of syphilis 1998  . H/O candidiasis   . Hx: UTI (urinary tract infection)   . Yeast infection   . Abnormal Pap smear     Medication was treatment  . Exposure to STD 11/04/2011  . History of anemia 11/04/2011  . Anemia     Hx -during Second Pregnancy   Past Surgical History  Procedure Laterality Date  . Cesarean section  2003, 2004, 2008,2013    x 4  . Cesarean section  03/23/2012    Procedure: CESAREAN SECTION;  Surgeon: Kirkland Hun, MD;  Location: [facility];  Service: Obstetrics;  Laterality: N/A;  Repeat   Family History  Problem Relation Age of Onset  . Diabetes Father   . Hypertension Father   . Hypertension Sister   . Diabetes Sister     ADULT ONSET  . Hypertension Mother   . Heart disease Mother   . Diabetes Mother   . Thyroid disease Maternal Uncle   . Heart disease Maternal Grandmother   . Hypertension  Maternal Grandmother   . Diabetes Maternal Grandmother   . Diabetes Paternal Grandmother    History  Substance Use Topics  . Smoking status: Current Every Day Smoker -- 1.00 packs/day for 15 years    Types: Cigarettes  . Smokeless tobacco: Never Used     Comment: PT COUNSELED TO TRY DECREASING AMOUT SHE SMOKES A DAY UNTIL SHE CAN EVENTUALLY QUIT  . Alcohol Use: 0.0 oz/week     Comment: "every now and then. I haven't had none lately'   OB History    Gravida Para Term Preterm AB TAB SAB Ectopic Multiple Living   0 0 0 0 0 0 4     Review of Systems  Genitourinary: Negative.  Negative for dysuria.  Musculoskeletal: Positive for back pain.   Allergies  Review of patient's allergies indicates no known allergies.  Home Medications   Prior to Admission medications   Medication Sig Start Date End Date Taking? Authorizing Provider  acetaminophen (TYLENOL) 500 MG tablet Take 500 mg by mouth every 6 (six) hours as needed. For pain    Historical Provider, MD  AMOXICILLIN PO Take by mouth.    Historical Provider, MD  amoxicillin-clavulanate (AUGMENTIN) 875-125 MG per tablet Take 1 tablet by mouth every 12 (twelve) hours. 10/08/14   Clayburn Pert  Zollie Pee, MD  calcium carbonate (TUMS - DOSED IN MG ELEMENTAL CALCIUM) 500 MG chewable tablet Chew 2 tablets by mouth at bedtime.    Historical Provider, MD  hydrochlorothiazide (HYDRODIURIL) 12.5 MG tablet Take 1 tablet (12.5 mg total) by mouth daily. 12/24/13   Massie Maroon, FNP  metroNIDAZOLE (FLAGYL) 500 MG tablet Take 1 tablet (500 mg total) by mouth 2 (two) times daily. 04/30/14   Graylon Good, PA-C  Multiple Vitamin (MULTIVITAMIN WITH MINERALS) TABS tablet Take 1 tablet by mouth daily.    Historical Provider, MD  naproxen (NAPROSYN) 375 MG tablet Take 1 tablet (375 mg total) by mouth 2 (two) times daily. 10/08/14   Rodolph Bong, MD   BP 180/97 mmHg  Pulse 65  Temp(Src) 97.1 F (36.2 C) (Oral)  Resp 14  Ht 5\' 3"  (1.6 m)  Wt 154 lb (69.854  kg)  BMI 27.29 kg/m2  SpO2 100%  LMP 10/03/2014 Physical Exam  Constitutional: She is oriented to person, place, and time. She appears well-developed and well-nourished. No distress.  HENT:  Head: Normocephalic and atraumatic.  Eyes: Conjunctivae and EOM are normal.  Neck: Neck supple. No tracheal deviation present.  Cardiovascular: Normal rate.   Pulmonary/Chest: Effort normal. No respiratory distress.  Musculoskeletal: Normal range of motion.  Diffusely tender lumbar spine. Pain with ROM.   Neurological: She is alert and oriented to person, place, and time.  Skin: Skin is warm and dry.  Psychiatric: She has a normal mood and affect. Her behavior is normal.  Nursing note and vitals reviewed.  ED Course  Procedures (including critical care time) DIAGNOSTIC STUDIES: Oxygen Saturation is 100% on RA, normal by my interpretation.    COORDINATION OF CARE: 9:25 AM-Discussed treatment plan which includes UA, XR and Norco with pt at bedside and pt agreed to plan.   Labs Review Labs Reviewed  PREGNANCY, URINE  URINALYSIS, ROUTINE W REFLEX MICROSCOPIC (NOT AT [facility])   Imaging Review Dg Lumbar Spine Complete  12/03/2014   CLINICAL DATA:  Worsening back pain starting this morning  EXAM: LUMBAR SPINE - COMPLETE 4+ VIEW  COMPARISON:  None.  FINDINGS: Five views of lumbar spine submitted. No acute fracture or subluxation. Alignment, disc spaces and vertebral body heights are preserved.  IMPRESSION: Negative.   Electronically Signed   By: Natasha Mead M.D.   On: 12/03/2014 09:57    EKG Interpretation None      MDM   Final diagnoses:  Bilateral low back pain without sciatica    Robaxin hydrrocodone Ibuprofen Follow up with the Orthopaedist for recheck.    Lonia Skinner [place], PA-C 12/03/14 1440  Blane Ohara, MD 12/03/14 [phone]

## 2014-12-03 NOTE — ED Notes (Signed)
Pt reports Back pain started this AM while bending over to do laundry. Pt pain scale 10/10.Pt reports back pain radiates to upper thigh.

## 2014-12-03 NOTE — ED Notes (Signed)
Pt has returned from xray

## 2015-03-05 ENCOUNTER — Encounter (HOSPITAL_COMMUNITY): Payer: Self-pay

## 2015-03-05 ENCOUNTER — Emergency Department (HOSPITAL_COMMUNITY)
Admission: EM | Admit: 2015-03-05 | Discharge: 2015-03-06 | Disposition: A | Discharge status: Other Acute Inpatient Hospital | Payer: Self-pay | Attending: Emergency Medicine | Admitting: Emergency Medicine

## 2015-03-05 DIAGNOSIS — Z87440 Personal history of urinary (tract) infections: Secondary | ICD-10-CM | POA: Insufficient documentation

## 2015-03-05 DIAGNOSIS — T39011A Poisoning by aspirin, accidental (unintentional), initial encounter: Secondary | ICD-10-CM | POA: Diagnosis present

## 2015-03-05 DIAGNOSIS — Z3202 Encounter for pregnancy test, result negative: Secondary | ICD-10-CM | POA: Insufficient documentation

## 2015-03-05 DIAGNOSIS — T50912A Poisoning by multiple unspecified drugs, medicaments and biological substances, intentional self-harm, initial encounter: Secondary | ICD-10-CM

## 2015-03-05 DIAGNOSIS — Z8679 Personal history of other diseases of the circulatory system: Secondary | ICD-10-CM | POA: Insufficient documentation

## 2015-03-05 DIAGNOSIS — T391X2A Poisoning by 4-Aminophenol derivatives, intentional self-harm, initial encounter: Secondary | ICD-10-CM | POA: Insufficient documentation

## 2015-03-05 DIAGNOSIS — Y998 Other external cause status: Secondary | ICD-10-CM | POA: Insufficient documentation

## 2015-03-05 DIAGNOSIS — Z720 Tobacco use: Secondary | ICD-10-CM | POA: Insufficient documentation

## 2015-03-05 DIAGNOSIS — T5192XA Toxic effect of unspecified alcohol, intentional self-harm, initial encounter: Secondary | ICD-10-CM | POA: Insufficient documentation

## 2015-03-05 DIAGNOSIS — Z8619 Personal history of other infectious and parasitic diseases: Secondary | ICD-10-CM | POA: Insufficient documentation

## 2015-03-05 DIAGNOSIS — Z862 Personal history of diseases of the blood and blood-forming organs and certain disorders involving the immune mechanism: Secondary | ICD-10-CM | POA: Insufficient documentation

## 2015-03-05 DIAGNOSIS — F322 Major depressive disorder, single episode, severe without psychotic features: Secondary | ICD-10-CM | POA: Diagnosis present

## 2015-03-05 DIAGNOSIS — Y9389 Activity, other specified: Secondary | ICD-10-CM | POA: Insufficient documentation

## 2015-03-05 DIAGNOSIS — Y9289 Other specified places as the place of occurrence of the external cause: Secondary | ICD-10-CM | POA: Insufficient documentation

## 2015-03-05 DIAGNOSIS — T39012A Poisoning by aspirin, intentional self-harm, initial encounter: Secondary | ICD-10-CM | POA: Insufficient documentation

## 2015-03-05 LAB — CBC
HCT: 41.7 % (ref 36.0–46.0)
HEMOGLOBIN: 14.8 g/dL (ref 12.0–15.0)
MCH: 34.7 pg — ABNORMAL HIGH (ref 26.0–34.0)
MCHC: 35.5 g/dL (ref 30.0–36.0)
MCV: 97.7 fL (ref 78.0–100.0)
PLATELETS: 234 10*3/uL (ref 150–400)
RBC: 4.27 MIL/uL (ref 3.87–5.11)
RDW: 12.5 % (ref 11.5–15.5)
WBC: 9.7 10*3/uL (ref 4.0–10.5)

## 2015-03-05 LAB — COMPREHENSIVE METABOLIC PANEL
ALT: 20 U/L (ref 14–54)
ANION GAP: 11 (ref 5–15)
AST: 23 U/L (ref 15–41)
Albumin: 4.6 g/dL (ref 3.5–5.0)
Alkaline Phosphatase: 62 U/L (ref 38–126)
BUN: 10 mg/dL (ref 6–20)
CHLORIDE: 107 mmol/L (ref 101–111)
CO2: 23 mmol/L (ref 22–32)
Calcium: 9.7 mg/dL (ref 8.9–10.3)
Creatinine, Ser: 0.93 mg/dL (ref 0.44–1.00)
GFR calc non Af Amer: >60 mL/min (ref >60)
Glucose, Bld: 95 mg/dL (ref 65–99)
POTASSIUM: 3.1 mmol/L — AB (ref 3.5–5.1)
SODIUM: 141 mmol/L (ref 135–145)
Total Bilirubin: 0.2 mg/dL — ABNORMAL LOW (ref 0.3–1.2)
Total Protein: 7.8 g/dL (ref 6.5–8.1)

## 2015-03-05 LAB — RAPID URINE DRUG SCREEN, HOSP PERFORMED
Amphetamines: NOT DETECTED
Barbiturates: NOT DETECTED
Benzodiazepines: NOT DETECTED
COCAINE: NOT DETECTED
OPIATES: NOT DETECTED
TETRAHYDROCANNABINOL: NOT DETECTED

## 2015-03-05 LAB — CBG MONITORING, ED: Glucose-Capillary: 88 mg/dL (ref 65–99)

## 2015-03-05 LAB — ACETAMINOPHEN LEVEL
ACETAMINOPHEN (TYLENOL), SERUM: 30 ug/mL (ref 10–30)
Acetaminophen (Tylenol), Serum: 22 ug/mL (ref 10–30)

## 2015-03-05 LAB — SALICYLATE LEVEL
SALICYLATE LVL: 22.6 mg/dL (ref 2.8–30.0)
SALICYLATE LVL: 19.4 mg/dL (ref 2.8–30.0)

## 2015-03-05 LAB — I-STAT BETA HCG BLOOD, ED (MC, WL, AP ONLY): I-stat hCG, quantitative: <5 m[IU]/mL (ref <5)

## 2015-03-05 LAB — ETHANOL: ALCOHOL ETHYL (B): 8 mg/dL — AB (ref <5)

## 2015-03-05 MED ORDER — HYDROCHLOROTHIAZIDE 12.5 MG PO CAPS
12.5000 mg | ORAL_CAPSULE | Freq: Every day | ORAL | Status: DC
  Administered 2015-03-05: 12.5 mg via ORAL
  Filled 2015-03-05 (×2): qty 1

## 2015-03-05 MED ORDER — CHARCOAL ACTIVATED PO LIQD
50.0000 g | Freq: Once | ORAL | Status: AC
  Administered 2015-03-05: 50 g via ORAL
  Filled 2015-03-05: qty 240

## 2015-03-05 MED ORDER — ONDANSETRON HCL 4 MG PO TABS: 4.0000 mg | ORAL_TABLET | Freq: Three times a day (TID) | ORAL | Status: DC | PRN

## 2015-03-05 MED ORDER — SODIUM CHLORIDE 0.9 % IV BOLUS (SEPSIS)
1000.0000 mL | Freq: Once | INTRAVENOUS | Status: AC
  Administered 2015-03-05: 1000 mL via INTRAVENOUS

## 2015-03-05 MED ORDER — POTASSIUM CHLORIDE CRYS ER 20 MEQ PO TBCR
40.0000 meq | EXTENDED_RELEASE_TABLET | Freq: Once | ORAL | Status: AC
  Administered 2015-03-05: 40 meq via ORAL
  Filled 2015-03-05: qty 2

## 2015-03-05 MED ORDER — ALUM & MAG HYDROXIDE-SIMETH 200-200-20 MG/5ML PO SUSP: 30.0000 mL | ORAL | Status: DC | PRN

## 2015-03-05 MED ORDER — IBUPROFEN 200 MG PO TABS: 600.0000 mg | ORAL_TABLET | Freq: Three times a day (TID) | ORAL | Status: DC | PRN

## 2015-03-05 MED ORDER — NICOTINE 21 MG/24HR TD PT24: 21.0000 mg | MEDICATED_PATCH | Freq: Every day | TRANSDERMAL | Status: DC

## 2015-03-05 MED ORDER — LORAZEPAM 1 MG PO TABS: 1.0000 mg | ORAL_TABLET | Freq: Three times a day (TID) | ORAL | Status: DC | PRN

## 2015-03-05 MED ORDER — ZOLPIDEM TARTRATE 5 MG PO TABS: 5.0000 mg | ORAL_TABLET | Freq: Every evening | ORAL | Status: DC | PRN

## 2015-03-05 NOTE — ED Notes (Signed)
Bed: WA09 Expected date:  Expected time:  Means of arrival:  Comments: 65- ingestion

## 2015-03-05 NOTE — ED Notes (Signed)
EDPA TIFFANY PRESENT SPEAKING WITH PT AND FAMILY at bedside.

## 2015-03-05 NOTE — ED Notes (Addendum)
Per [facility]- Pt reports ingestion of Excedrin extra strength (19 tablets) and ETOH at 0830. Pt c/o of stomach pain 4/10 with N/V. No tablets seen with emesis. Pt states she lost 3318 month old son 15 months ago. " He would be 3years now". Family found her and they called EMS.

## 2015-03-05 NOTE — ED Provider Notes (Signed)
CSN: [id number]     Arrival date & time 03/05/15  [id number] History   First MD Initiated Contact with Patient 03/05/15 [phone]     Chief Complaint  Patient presents with  . Suicidal  . Medical Clearance     (Consider location/radiation/quality/duration/timing/severity/associated sxs/prior Treatment) HPI   Patient to the ER by EMS brought to ER for overdose on Extra Strength Excedrin and alcohol. This month is the birthday of her wound be 34 year old son who died 15 months ago at the age of 34 months. She reports just having a bad day. She has another child at home. She reports feeling depressed and not wanting to live anymore. Denies ingesting anything else or doing anything else to harm herself.  Pt denies HI, substance abuse. She denies ever trying to harm herself in the past.    Past Medical History  Diagnosis Date  . Migraines     TYPICALLY TAKES EXCEDERIN MIGRAINE  . History of chlamydia 1998  . History of gonorrhea   . Hx of syphilis 1998  . H/O candidiasis   . Hx: UTI (urinary tract infection)   . Yeast infection   . Abnormal Pap smear     Medication was treatment  . Exposure to STD 11/04/2011  . History of anemia 11/04/2011  . Anemia     Hx -during Second Pregnancy   Past Surgical History  Procedure Laterality Date  . Cesarean section  2003, 2004, 2008,2013    x 4  . Cesarean section  03/23/2012    Procedure: CESAREAN SECTION;  Surgeon: Kirkland HunArthur Stringer, MD;  Location: [facility];  Service: Obstetrics;  Laterality: N/A;  Repeat   Family History  Problem Relation Age of Onset  . Diabetes Father   . Hypertension Father   . Hypertension Sister   . Diabetes Sister     ADULT ONSET  . Hypertension Mother   . Heart disease Mother   . Diabetes Mother   . Thyroid disease Maternal Uncle   . Heart disease Maternal Grandmother   . Hypertension Maternal Grandmother   . Diabetes Maternal Grandmother   . Diabetes Paternal Grandmother    Social History  Substance Use Topics  .  Smoking status: Current Every Day Smoker -- 1.00 packs/day for 15 years    Types: Cigarettes  . Smokeless tobacco: Never Used     Comment: PT COUNSELED TO TRY DECREASING AMOUT SHE SMOKES A DAY UNTIL SHE CAN EVENTUALLY QUIT  . Alcohol Use: 0.0 oz/week     Comment: "every now and then. I haven't had none lately'   OB History    Gravida Para Term Preterm AB TAB SAB Ectopic Multiple Living   4 4 4  0 0 0 0 0 0 4     Review of Systems  10 Systems reviewed and are negative for acute change except as noted in the HPI.     Allergies  Review of patient's allergies indicates no known allergies.  Home Medications   Prior to Admission medications   Medication Sig Start Date End Date Taking? Authorizing Provider  aspirin-acetaminophen-caffeine (EXCEDRIN MIGRAINE) 9491213426250-250-65 MG tablet Take 19 tablets by mouth once.   Yes Historical Provider, MD  Dextromethorphan-Guaifenesin (ALKA-SELTZER PLUS MUCUS & CONG PO) Take 1 tablet by mouth once.   Yes Historical Provider, MD  amoxicillin-clavulanate (AUGMENTIN) 875-125 MG per tablet Take 1 tablet by mouth every 12 (twelve) hours. Patient not taking: Reported on 03/05/2015 10/08/14   Rodolph BongEvan S Corey, MD  diclofenac (VOLTAREN) 75  MG EC tablet Take 1 tablet (75 mg total) by mouth 2 (two) times daily. Patient not taking: Reported on 03/05/2015 12/03/14   Elson Areas, PA-C  hydrochlorothiazide (HYDRODIURIL) 12.5 MG tablet Take 1 tablet (12.5 mg total) by mouth daily. Patient not taking: Reported on 03/05/2015 12/24/13   Massie Maroon, FNP  HYDROcodone-acetaminophen (NORCO/VICODIN) 5-325 MG per tablet Take 2 tablets by mouth every 4 (four) hours as needed. Patient not taking: Reported on 03/05/2015 12/03/14   Elson Areas, PA-C  methocarbamol (ROBAXIN) 500 MG tablet Take 1 tablet (500 mg total) by mouth 2 (two) times daily. Patient not taking: Reported on 03/05/2015 12/03/14   Elson Areas, PA-C  naproxen (NAPROSYN) 375 MG tablet Take 1 tablet (375 mg total) by  mouth 2 (two) times daily. Patient not taking: Reported on 03/05/2015 10/08/14   Rodolph Bong, MD   BP 121/76 mmHg  Pulse 64  Temp(Src) 98.1 F (36.7 C) (Oral)  Resp 19  SpO2 99% Physical Exam  Constitutional: She appears well-developed and well-nourished. No distress.  HENT:  Head: Normocephalic and atraumatic.  Eyes: Pupils are equal, round, and reactive to light.  Neck: Normal range of motion. Neck supple.  Cardiovascular: Normal rate and regular rhythm.   Pulmonary/Chest: Effort normal.  Abdominal: Soft.  Neurological: She is alert.  Skin: Skin is warm and dry. No rash noted.  Psychiatric: Her behavior is normal. Her mood appears not anxious. Her speech is not rapid and/or pressured. Thought content is not paranoid. She exhibits a depressed mood (tearful). She expresses suicidal ideation. She expresses no homicidal ideation. She expresses suicidal plans. She expresses no homicidal plans.  Nursing note and vitals reviewed.   ED Course  Procedures (including critical care time) Labs Review Labs Reviewed  COMPREHENSIVE METABOLIC PANEL - Abnormal; Notable for the following:    Potassium 3.1 (*)    Total Bilirubin 0.2 (*)    All other components within normal limits  ETHANOL - Abnormal; Notable for the following:    Alcohol, Ethyl (B) 8 (*)    All other components within normal limits  CBC - Abnormal; Notable for the following:    MCH 34.7 (*)    All other components within normal limits  SALICYLATE LEVEL  ACETAMINOPHEN LEVEL  URINE RAPID DRUG SCREEN, HOSP PERFORMED  ACETAMINOPHEN LEVEL  SALICYLATE LEVEL  CBG MONITORING, ED  I-STAT BETA HCG BLOOD, ED ([facility], [facility], [facility] ONLY)    Imaging Review No results found. I have personally reviewed and evaluated these images and lab results as part of my medical decision-making.   EKG Interpretation None      MDM   Final diagnoses:  Suicide attempt by multiple drug overdose, initial encounter [facility] [place])  Depression   Corliss Blacker, RN 03/05/2015 10:05   Expand All Collapse All   Poison Control called. Given 50 gram activated charcoal. IV fluid NS. Urine output 2 cckg /hr. Repeats LABS at 12:30. 3 hours if aware and alert, bowel sound- additional 25 grams charcoal. Reserve benzo for seizures. Altered mental status notify immediately. Notify [facility]       Addendum to note by Corliss Blacker, RN at 03/05/2015 10:11      Discussed with Dr. Patria Mane, 50gram activated charcoal ordered. Patients blood work initially is unremarkable, mildly elevated ETOH.  The patient has now decided that she wants to leave and is refusing to stay but does not actively try to get up and leave. Her family members who  are here are able to calm her down and redirect her. She did get aggressive with staff members but was able to be re-directed. Dr. Donnald Garre has seen patient as well and discussed the plan which will be psych evaluation.   IVC papers have been filled out and are on hold in case patient tries to leave, Nena Jordan from [facility] has them for patients folder. Repeat Tylenol and Aspirin levels are negative. Pt medically cleared to be monitored overnight and re-evaluated by psych tomorrow AM.  Psych holding orders placed Home meds reviewed and ordered as appropriate.   Filed Vitals:   03/05/15 1415  BP: 121/76  Pulse: 64  Temp:   Resp: [id number], PA-C 03/05/15 1445  Arby Barrette, MD 03/05/15 [phone]

## 2015-03-05 NOTE — Progress Notes (Signed)
Patient alert and orientedX4. Cooperative but poor insight. States she doesn't need to be here. Told writer she called her cousin after she took 6715 Excedrine to tell her and stated she asked how many more she need to take to kill herself. She denied to other examiners that it was not a suicide attempt though. She kept stating she was having a "bad day" and that "the devil got a hold of me". Patient was made IVC'd. Patient currently denies SI/HI/AVH. Denies pain.

## 2015-03-05 NOTE — BH Assessment (Signed)
Tele Assessment Note   Anita Fuentes is an 34 y.o. female. Pt presents voluntarily to [facility] BIB [facility]. Pt reports she ingested 19 excedrin pills and some alcohol.  Pt sts she was "having a bad day". Pt denies that she was trying to commit suicide. She says that she was upset b/c her 44 mo old child died 15 mos ago and this would be the child's birthday month. Pt reports euthymic mood and her affect is mood congruent. Pt sts she works at [employer]. Pt sts she lives w/ her brother and pt's young child. Pt denies substance abuse. She reports no hx of inpt or outpt MH treatment. She denies hx of suicide attempts and denies hx of self harm. Pt states she is ready to be d/c. Writer talks w/ pt about the possible consequences of overdose including death. Pt appears to minimize her Excedrin ingestion this am.  Pt's sister is present at bedside. Sister reports pt texted her this am to say she had ingested several excedrin. Sister says she called EMS and they picked up pt.  Writer ran pt by Nanine Means DNP who recommends inpatient treatment.    Diagnosis:  Unspecified Depressive Disorder  Past Medical History:  Past Medical History  Diagnosis Date  . Migraines     TYPICALLY TAKES EXCEDERIN MIGRAINE  . History of chlamydia 1998  . History of gonorrhea   . Hx of syphilis 1998  . H/O candidiasis   . Hx: UTI (urinary tract infection)   . Yeast infection   . Abnormal Pap smear     Medication was treatment  . Exposure to STD 11/04/2011  . History of anemia 11/04/2011  . Anemia     Hx -during Second Pregnancy    Past Surgical History  Procedure Laterality Date  . Cesarean section  2003, 2004, 2008,2013    x 4  . Cesarean section  03/23/2012    Procedure: CESAREAN SECTION;  Surgeon: Kirkland Hun, MD;  Location: [facility] ORS;  Service: Obstetrics;  Laterality: N/A;  Repeat    Family History:  Family History  Problem Relation Age of Onset  . Diabetes Father   . Hypertension Father   .  Hypertension Sister   . Diabetes Sister     ADULT ONSET  . Hypertension Mother   . Heart disease Mother   . Diabetes Mother   . Thyroid disease Maternal Uncle   . Heart disease Maternal Grandmother   . Hypertension Maternal Grandmother   . Diabetes Maternal Grandmother   . Diabetes Paternal Grandmother     Social History:  reports that she has been smoking Cigarettes.  She has a 15 pack-year smoking history. She has never used smokeless tobacco. She reports that she drinks alcohol. She reports that she does not use illicit drugs.  Additional Social History:  Alcohol / Drug Use Pain Medications: pt denies abuse Prescriptions: pt denies abuse Over the Counter: pt denies abuse History of alcohol / drug use?: Yes Substance #1 Name of Substance 1: alcohol 1 - Frequency: few times a month 1 - Last Use / Amount: 03/05/15 - two shots [place] gin  CIWA: CIWA-Ar BP: 134/82 mmHg Pulse Rate: 75 COWS:    PATIENT STRENGTHS: (choose at least two) Average or above average intelligence Communication skills Physical Health  Allergies: No Known Allergies  Home Medications:  (Not in a hospital admission)  OB/GYN Status:  No LMP recorded.  General Assessment Data Location of Assessment: [facility] ED TTS Assessment: In  system Is this a Tele or Face-to-Face Assessment?: Face-to-Face Is this an Initial Assessment or a Re-assessment for this encounter?: Initial Assessment Marital status: Single Living Arrangements: Children, Other relatives (brother, young child) Can pt return to current living arrangement?: Yes Admission Status: Voluntary Is patient capable of signing voluntary admission?: Yes Referral Source: Self/Family/Friend Insurance type: self pay     Crisis Care Plan Living Arrangements: Children, Other relatives (brother, young child) Name of Psychiatrist: none Name of Therapist: none  Education Status Is patient currently in school?: No  Risk to self with the past 6  months Suicidal Ideation: No Has patient been a risk to self within the past 6 months prior to admission? : No Suicidal Intent: No Has patient had any suicidal intent within the past 6 months prior to admission? : No Is patient at risk for suicide?: Yes Suicidal Plan?: No Has patient had any suicidal plan within the past 6 months prior to admission? : No Access to Means: Yes Specify Access to Suicidal Means: pt took 19 excedrin and drank alcohol this am What has been your use of drugs/alcohol within the last 12 months?: occasional alcohol use Previous Attempts/Gestures: No How many times?: 0 Other Self Harm Risks: none Intentional Self Injurious Behavior: None Family Suicide History: No Recent stressful life event(s): Other (Comment), Financial Problems (18 mo old child died 15 mos ago ) Persecutory voices/beliefs?: No Depression: No Substance abuse history and/or treatment for substance abuse?: No Suicide prevention information given to non-admitted patients: Not applicable  Risk to Others within the past 6 months Homicidal Ideation: No Does patient have any lifetime risk of violence toward others beyond the six months prior to admission? : No Thoughts of Harm to Others: No Current Homicidal Intent: No Current Homicidal Plan: No Access to Homicidal Means: No Identified Victim: none History of harm to others?: No Assessment of Violence: None Noted Violent Behavior Description: pt denies hx violence - pt is calm Does patient have access to weapons?: No Criminal Charges Pending?: No Does patient have a court date: No Is patient on probation?: No  Psychosis Hallucinations: None noted Delusions: None noted  Mental Status Report Appearance/Hygiene: Unremarkable, In scrubs Eye Contact: Good Motor Activity: Freedom of movement Speech: Logical/coherent Level of Consciousness: Quiet/awake, Alert Mood: Euthymic Affect:  (euthymic) Anxiety Level: None Thought Processes:  Coherent, Relevant Judgement: Unimpaired Orientation: Person, Place, Situation, Time Obsessive Compulsive Thoughts/Behaviors: None  Cognitive Functioning Concentration: Normal Memory: Recent Intact, Remote Intact IQ: Average Insight: Poor Impulse Control: Poor Appetite: Good Sleep: No Change Total Hours of Sleep: 8 Vegetative Symptoms: None  ADLScreening [facility] Assessment Services) Patient's cognitive ability adequate to safely complete daily activities?: Yes Patient able to express need for assistance with ADLs?: Yes Independently performs ADLs?: Yes (appropriate for developmental age)  Prior Inpatient Therapy Prior Inpatient Therapy: No Prior Therapy Dates: na Prior Therapy Facilty/Provider(s): na Reason for Treatment: na  Prior Outpatient Therapy Prior Outpatient Therapy: No Prior Therapy Dates: na Prior Therapy Facilty/Provider(s): na Reason for Treatment: na Does patient have an ACCT team?: No Does patient have Intensive In-House Services?  : No Does patient have [facility] services? : No Does patient have P4CC services?: No  ADL Screening (condition at time of admission) Patient's cognitive ability adequate to safely complete daily activities?: Yes Is the patient deaf or have difficulty hearing?: No Does the patient have difficulty seeing, even when wearing glasses/contacts?: No Does the patient have difficulty concentrating, remembering, or making decisions?: No Patient able to express need for  assistance with ADLs?: Yes Does the patient have difficulty dressing or bathing?: No Independently performs ADLs?: Yes (appropriate for developmental age) Does the patient have difficulty walking or climbing stairs?: No Weakness of Legs: None Weakness of Arms/Hands: None  Home Assistive Devices/Equipment Home Assistive Devices/Equipment: None    Abuse/Neglect Assessment (Assessment to be complete while patient is alone) Physical Abuse: Denies Verbal Abuse:  Denies Sexual Abuse: Denies Exploitation of patient/patient's resources: Denies Self-Neglect: Denies     Merchant navy officerAdvance Directives (For Healthcare) Does patient have an advance directive?: No Would patient like information on creating an advanced directive?: No - patient declined information    Additional Information 1:1 In Past 12 Months?: No CIRT Risk: No Elopement Risk: No Does patient have medical clearance?: No     Disposition:  Disposition Initial Assessment Completed for this Encounter: Yes Disposition of Patient: Inpatient treatment program Type of inpatient treatment program: Adult [facility](jamison lord DNP rec inpatient treatment)  Anita Fuentes P 03/05/2015 2:18 PM

## 2015-03-05 NOTE — ED Notes (Signed)
PT WITH AGGRESSIVE VERBAL RESPONSES TO STAFF AND EDPA TIFFANY. PT WITH DEMANDS TO LEAVE. FAMILY PRESENT TO WITNESS BEHAVIOR.  IVC PAPERS IN PROGRESS

## 2015-03-05 NOTE — ED Notes (Signed)
Call from poison control to follow up on patient labs, ECG and medications given since arrival to ED.  Per our conversation he states that no further follow up is needed at this time.

## 2015-03-05 NOTE — ED Notes (Signed)
Shirt, picture frame, sweat pants, tennis shoes, hair net for hair. Belonging bag x 1 at nursing station in front of 9-12

## 2015-03-05 NOTE — ED Notes (Signed)
Pt placed in scrubs and pt phone placed with other belongings

## 2015-03-05 NOTE — ED Notes (Signed)
Pt ambulated to RR unassisted 

## 2015-03-05 NOTE — ED Notes (Addendum)
Poison Control called. Give 50 grams activated charcoal. IV fluid NS. Urine output 2 cckg /hr. Repeats LABS at 12:30. In 3 hours if awake and alert with normal bowel sounds Give additional 25 grams charcoal. Reserve benzo for seizures. Altered mental status notify immediately. Notify [facility]

## 2015-03-05 NOTE — B[facility] Counselor (Signed)
Magistrate Long confirms receipt of IVC paperwork. Originals placed in [employer] notebook.   Anita Fuentes, [place] Therapeutic Triage Specialist

## 2015-03-05 NOTE — ED Notes (Signed)
Bed: [id number] Expected date:  Expected time:  Means of arrival:  Comments: Hold room 9

## 2015-03-06 ENCOUNTER — Inpatient Hospital Stay (HOSPITAL_COMMUNITY)
Admission: AD | Admit: 2015-03-06 | Discharge: 2015-03-10 | DRG: 885 | Disposition: A | Discharge status: Home / Self Care | Payer: Federal, State, Local not specified - Other | Attending: Psychiatry | Admitting: Psychiatry

## 2015-03-06 ENCOUNTER — Encounter (HOSPITAL_COMMUNITY): Payer: Self-pay | Admitting: *Deleted

## 2015-03-06 DIAGNOSIS — Z833 Family history of diabetes mellitus: Secondary | ICD-10-CM

## 2015-03-06 DIAGNOSIS — F419 Anxiety disorder, unspecified: Secondary | ICD-10-CM | POA: Diagnosis present

## 2015-03-06 DIAGNOSIS — Z8249 Family history of ischemic heart disease and other diseases of the circulatory system: Secondary | ICD-10-CM

## 2015-03-06 DIAGNOSIS — T1491 Suicide attempt: Secondary | ICD-10-CM | POA: Diagnosis not present

## 2015-03-06 DIAGNOSIS — Z87891 Personal history of nicotine dependence: Secondary | ICD-10-CM

## 2015-03-06 DIAGNOSIS — R45851 Suicidal ideations: Secondary | ICD-10-CM | POA: Diagnosis not present

## 2015-03-06 DIAGNOSIS — T39011A Poisoning by aspirin, accidental (unintentional), initial encounter: Secondary | ICD-10-CM | POA: Diagnosis present

## 2015-03-06 DIAGNOSIS — T50912A Poisoning by multiple unspecified drugs, medicaments and biological substances, intentional self-harm, initial encounter: Secondary | ICD-10-CM | POA: Insufficient documentation

## 2015-03-06 DIAGNOSIS — G4700 Insomnia, unspecified: Secondary | ICD-10-CM | POA: Diagnosis present

## 2015-03-06 DIAGNOSIS — Z818 Family history of other mental and behavioral disorders: Secondary | ICD-10-CM | POA: Diagnosis not present

## 2015-03-06 DIAGNOSIS — I10 Essential (primary) hypertension: Secondary | ICD-10-CM | POA: Diagnosis present

## 2015-03-06 DIAGNOSIS — T391X2A Poisoning by 4-Aminophenol derivatives, intentional self-harm, initial encounter: Secondary | ICD-10-CM | POA: Diagnosis not present

## 2015-03-06 DIAGNOSIS — F322 Major depressive disorder, single episode, severe without psychotic features: Principal | ICD-10-CM | POA: Diagnosis present

## 2015-03-06 MED ORDER — NICOTINE 21 MG/24HR TD PT24
21.0000 mg | MEDICATED_PATCH | Freq: Every day | TRANSDERMAL | Status: DC
  Filled 2015-03-06 (×3): qty 1

## 2015-03-06 MED ORDER — ALUM & MAG HYDROXIDE-SIMETH 200-200-20 MG/5ML PO SUSP: 30.0000 mL | ORAL | Status: DC | PRN

## 2015-03-06 MED ORDER — TRAZODONE HCL 100 MG PO TABS
100.0000 mg | ORAL_TABLET | Freq: Every day | ORAL | Status: DC
  Filled 2015-03-06 (×5): qty 1

## 2015-03-06 MED ORDER — HYDROCHLOROTHIAZIDE 12.5 MG PO CAPS
12.5000 mg | ORAL_CAPSULE | Freq: Every day | ORAL | Status: DC
  Administered 2015-03-07 – 2015-03-10 (×4): 12.5 mg via ORAL
  Filled 2015-03-06 (×6): qty 1

## 2015-03-06 MED ORDER — POTASSIUM CHLORIDE CRYS ER 10 MEQ PO TBCR
10.0000 meq | EXTENDED_RELEASE_TABLET | Freq: Every day | ORAL | Status: DC
Start: 2015-03-06 — End: 2015-03-06
  Filled 2015-03-06: qty 1

## 2015-03-06 MED ORDER — TRAZODONE HCL 100 MG PO TABS: 100.0000 mg | ORAL_TABLET | Freq: Every day | ORAL | Status: DC

## 2015-03-06 MED ORDER — ACETAMINOPHEN 325 MG PO TABS
650.0000 mg | ORAL_TABLET | Freq: Four times a day (QID) | ORAL | Status: DC | PRN
  Administered 2015-03-06: 650 mg via ORAL
  Filled 2015-03-06: qty 2

## 2015-03-06 MED ORDER — ALUM & MAG HYDROXIDE-SIMETH 200-200-20 MG/5ML PO SUSP
30.0000 mL | ORAL | Status: DC | PRN
Start: 2015-03-06 — End: 2015-03-10

## 2015-03-06 MED ORDER — LORAZEPAM 1 MG PO TABS: 1.0000 mg | ORAL_TABLET | Freq: Three times a day (TID) | ORAL | Status: DC | PRN

## 2015-03-06 MED ORDER — ONDANSETRON HCL 4 MG PO TABS: 4.0000 mg | ORAL_TABLET | Freq: Three times a day (TID) | ORAL | Status: DC | PRN

## 2015-03-06 MED ORDER — POTASSIUM CHLORIDE CRYS ER 10 MEQ PO TBCR
10.0000 meq | EXTENDED_RELEASE_TABLET | Freq: Every day | ORAL | Status: DC
  Administered 2015-03-07 – 2015-03-10 (×4): 10 meq via ORAL
  Filled 2015-03-06 (×5): qty 1

## 2015-03-06 MED ORDER — MAGNESIUM HYDROXIDE 400 MG/5ML PO SUSP
30.0000 mL | Freq: Every day | ORAL | Status: DC | PRN
Start: 2015-03-06 — End: 2015-03-10

## 2015-03-06 MED ORDER — IBUPROFEN 600 MG PO TABS: 600.0000 mg | ORAL_TABLET | Freq: Three times a day (TID) | ORAL | Status: DC | PRN

## 2015-03-06 MED ORDER — INFLUENZA VAC SPLIT QUAD 0.5 ML IM SUSY
0.5000 mL | PREFILLED_SYRINGE | INTRAMUSCULAR | Status: AC
  Administered 2015-03-09: 0.5 mL via INTRAMUSCULAR
  Filled 2015-03-06: qty 0.5

## 2015-03-06 NOTE — ED Notes (Signed)
Refusing scheduled medications and lab work.

## 2015-03-06 NOTE — Progress Notes (Signed)
Anita Fuentes Anita Fuentes is a VERY upset 34 yo black female who [facility]  On Thursday ( 03/05/2015) in  which she took 519 excedrin, due to the anniversary of losing her 15mth old son last year, this time   (she says he drowned). She presents to the search room and she has her head down on her chest, laying across the table, sobbing uncontrollably. For the first 15 minutes of the admission, she is unconsolable. Sobbing and cussing and swearing and not participating in the admission process. She is VERY upset and shares that she feels like the doctors and counselors " across the street"( [facility] " [facility] me" and this is what she is MOST upset about. IVC commitment explained to this patient and she then allowed this nurse to complete admission assessment. She reports she has a history of HTN, she says she does not have a PCP , that she is the mother of 3 children " an 148yo, 34yo and a 313 yo'. And she is UNDONE that she is IVC'd tot his hospital. 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 She denies any drug and / or food allergies , contracts for safety and is admitted to the unit.

## 2015-03-06 NOTE — BH Assessment (Signed)
[facility] Assessment Progress Note  Per Thedore MinsMojeed Akintayo, MD, this pt requires psychiatric hospitalization at this time.  Anita Heinrichina Farnworth, RN, [facility].  Pt is under IVC initiated by the EDP.  Pt has signed Consent to Release Information to her sister Anita Fuentes, and signed [facility] documents.  Pt's nurse, Carlisle BeersLuann, has been notified, and agrees to send original paperwork along with pt via law enforcement, and to call report to [phone].  Doylene Canninghomas Joshaua Epple, MA Triage Specialist [phone]

## 2015-03-06 NOTE — Progress Notes (Signed)
Adult Psychoeducational Group Note  Date:  03/06/2015 Time:  10:39 PM  Group Topic/Focus:  Wrap-Up Group:   The focus of this group is to help patients review their daily goal of treatment and discuss progress on daily workbooks.  Participation Level:  Active  Participation Quality:  Appropriate  Affect:  Appropriate  Cognitive:  Appropriate  Insight: Good  Engagement in Group:  Engaged  Modes of Intervention:  Activity  Additional Comments:  Patient rated her a 9. Goal is to think through things before acting upon them.  Natasha MeadKiara M Mataeo Ingwersen 03/06/2015, 10:39 PM

## 2015-03-06 NOTE — Consult Note (Signed)
[facility] Consult   Reason for Consult:  Intentional overdose Referring Physician:  EDP Patient Identification: Anita Fuentes MRN:  [id number] Principal Diagnosis: Depression, major, single episode, severe ([place]) Diagnosis:   Patient Active Problem List   Diagnosis Date Noted  . Depression, major, single episode, severe ([place]) [F32.2] 03/06/2015    Priority: High  . Overdose of aspirin [T39.011A] 03/06/2015    Priority: High  . Risk for sexually transmitted disease [Z72.51] 12/27/2013  . Unspecified essential hypertension [I10] 12/27/2013  . Abdominal discomfort, generalized [R10.84] 12/27/2013  . Toothache [K08.89] 12/27/2013  . Tobacco dependence [F17.200] 12/27/2013  . Other malaise and fatigue [R53.81, R53.83] 12/27/2013  . Bilateral edema of lower extremity [R60.0] 12/27/2013  . Notalgia [M54.9] 12/27/2013  . Need for Tdap vaccination [Z23] 12/27/2013  . Vaginal pain [R10.2] 12/27/2013  . Depression [F32.9] 12/27/2013  . Exposure to STD [Z20.2] 11/04/2011  . History of anemia [Z86.2] 11/04/2011  . Pregnancy, normal, incidental [Z33.1] 09/05/2011  . History of cesarean section [Z98.891] 09/05/2011  . History of RPR test [Z92.89] 09/05/2011  . Tobacco abuse [Z72.0] 09/05/2011  . 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 female patient admitted with intentional overdose.  HPI:  On admission:  34 y.o. female. Pt presents voluntarily to [facility]. Pt reports she ingested 19 excedrin pills and some alcohol. Pt sts she was "having a bad day". Pt denies that she was trying to commit suicide. She says that she was upset b/c her 34 mo old child died 60 mos ago and this would be the child's birthday month. Pt reports euthymic mood and her affect is mood congruent. Pt sts she works at [employer]. Pt sts she lives w/ her brother and pt's young child. Pt  denies substance abuse. She reports no hx of inpt or outpt MH treatment. She denies hx of suicide attempts and denies hx of self harm. Pt states she is ready to be d/c. Writer talks w/ pt about the possible consequences of overdose including death. Pt appears to minimize her Excedrin ingestion this am.  Pt's sister is present at bedside. Sister reports pt texted her this am to say she had ingested several excedrin. Sister says she called EMS and they picked up pt.   Today:  Anita Fuentes reports becoming upset when she thought of the death of her 75 month old son and then overdosed while drinking alcohol.  She claims if it was a suicide attempt, she would not have texted 5 friends/family that she overdosed.  Denies alcohol/drug abuse, homicidal ideations, and hallucinations.  No insight.  Past Psychiatric History: None  Risk to Self: Suicidal Ideation: No Suicidal Intent: No Is patient at risk for suicide?: Yes Suicidal Plan?: No Access to Means: Yes Specify Access to Suicidal Means: pt took 19 excedrin and drank alcohol this am What has been your use of drugs/alcohol within the last 12 months?: occasional alcohol use How many times?: 0 Other Self Harm Risks: none Intentional Self Injurious Behavior: None Risk to Others: Homicidal Ideation: No Thoughts of Harm to Others: No Current Homicidal Intent: No Current Homicidal Plan: No Access to Homicidal Means: No Identified Victim: none History of harm to others?: No Assessment of Violence: None Noted Violent Behavior Description: pt denies hx violence - pt is calm Does patient have access to weapons?: No Criminal Charges Pending?: No Does patient have  a court date: No Prior Inpatient Therapy: Prior Inpatient Therapy: No Prior Therapy Dates: na Prior Therapy Facilty/Provider(s): na Reason for Treatment: na Prior Outpatient Therapy: Prior Outpatient Therapy: No Prior Therapy Dates: na Prior Therapy Facilty/Provider(s): na Reason for  Treatment: na Does patient have an ACCT team?: No Does patient have Intensive In-House Services?  : No Does patient have [facility] services? : No Does patient have P4CC services?: No  Past Medical History:  Past Medical History  Diagnosis Date  . Migraines     TYPICALLY TAKES EXCEDERIN MIGRAINE  . History of chlamydia 1998  . History of gonorrhea   . Hx of syphilis 1998  . H/O candidiasis   . Hx: UTI (urinary tract infection)   . Yeast infection   . Abnormal Pap smear     Medication was treatment  . Exposure to STD 11/04/2011  . History of anemia 11/04/2011  . Anemia     Hx -during Second Pregnancy    Past Surgical History  Procedure Laterality Date  . Cesarean section  2003, 2004, 2008,2013    x 4  . Cesarean section  03/23/2012    Procedure: CESAREAN SECTION;  Surgeon: Ena Dawley, MD;  Location: [facility];  Service: Obstetrics;  Laterality: N/A;  Repeat   Family History:  Family History  Problem Relation Age of Onset  . Diabetes Father   . Hypertension Father   . Hypertension Sister   . Diabetes Sister     ADULT ONSET  . Hypertension Mother   . Heart disease Mother   . Diabetes Mother   . Thyroid disease Maternal Uncle   . Heart disease Maternal Grandmother   . Hypertension Maternal Grandmother   . Diabetes Maternal Grandmother   . Diabetes Paternal Grandmother    Family Psychiatric  History: Denies Social History:  History  Alcohol Use  . 0.0 oz/week    Comment: "every now and then. I haven't had none lately'     History  Drug Use No    Comment: Prior to positive UPT - Last use August/September 2013  Denies 03/13/2014    Social History   Social History  . Marital Status: Single    Spouse Name: N/A  . Number of Children: N/A  . Years of Education: 68   Social History Main Topics  . Smoking status: Current Every Day Smoker -- 1.00 packs/day for 15 years    Types: Cigarettes  . Smokeless tobacco: Never Used     Comment: PT COUNSELED TO TRY DECREASING  AMOUT SHE SMOKES A DAY UNTIL SHE CAN EVENTUALLY QUIT  . Alcohol Use: 0.0 oz/week     Comment: "every now and then. I haven't had none lately'  . Drug Use: No     Comment: Prior to positive UPT - Last use August/September 2013  Denies 03/13/2014  . Sexual Activity: Not Currently    Birth Control/ Protection: None     Comment: DEPO PROVERA   Other Topics Concern  . None   Social History Narrative   Additional Social History:    Pain Medications: pt denies abuse Prescriptions: pt denies abuse Over the Counter: pt denies abuse History of alcohol / drug use?: Yes Name of Substance 1: alcohol 1 - Frequency: few times a month 1 - Last Use / Amount: 03/05/15 - two shots [place] gin                   Allergies:  No Known Allergies  Labs:  Results for  orders placed or performed during the hospital encounter of 03/05/15 (from the past 48 hour(s))  Urine rapid drug screen (hosp performed) (Not at [facility])     Status: None   Collection Time: 03/05/15 10:25 AM  Result Value Ref Range   Opiates NONE DETECTED NONE DETECTED   Cocaine NONE DETECTED NONE DETECTED   Benzodiazepines NONE DETECTED NONE DETECTED   Amphetamines NONE DETECTED NONE DETECTED   Tetrahydrocannabinol NONE DETECTED NONE DETECTED   Barbiturates NONE DETECTED NONE DETECTED    Comment:        DRUG SCREEN FOR MEDICAL PURPOSES ONLY.  IF CONFIRMATION IS NEEDED FOR ANY PURPOSE, NOTIFY LAB WITHIN 5 DAYS.        LOWEST DETECTABLE LIMITS FOR URINE DRUG SCREEN Drug Class       Cutoff (ng/mL) Amphetamine      1000 Barbiturate      200 Benzodiazepine   353 Tricyclics       614 Opiates          300 Cocaine          300 THC              50   CBG monitoring, ED     Status: None   Collection Time: 03/05/15 10:44 AM  Result Value Ref Range   Glucose-Capillary 88 65 - 99 mg/dL  Comprehensive metabolic panel     Status: Abnormal   Collection Time: 03/05/15 10:50 AM  Result Value Ref Range   Sodium 141 135 - 145  mmol/L   Potassium 3.1 (L) 3.5 - 5.1 mmol/L   Chloride 107 101 - 111 mmol/L   CO2 23 22 - 32 mmol/L   Glucose, Bld 95 65 - 99 mg/dL   BUN 10 6 - 20 mg/dL   Creatinine, Ser 0.93 0.44 - 1.00 mg/dL   Calcium 9.7 8.9 - 10.3 mg/dL   Total Protein 7.8 6.5 - 8.1 g/dL   Albumin 4.6 3.5 - 5.0 g/dL   AST 23 15 - 41 U/L   ALT 20 14 - 54 U/L   Alkaline Phosphatase 62 38 - 126 U/L   Total Bilirubin 0.2 (L) 0.3 - 1.2 mg/dL   GFR calc non Af Amer >60 >60 mL/min   GFR calc Af Amer >60 >60 mL/min    Comment: (NOTE) The eGFR has been calculated using the CKD EPI equation. This calculation has not been validated in all clinical situations. eGFR's persistently <60 mL/min signify possible Chronic Kidney Disease.    Anion gap 11 5 - 15  Ethanol (ETOH)     Status: Abnormal   Collection Time: 03/05/15 10:50 AM  Result Value Ref Range   Alcohol, Ethyl (B) 8 (H) <5 mg/dL    Comment:        LOWEST DETECTABLE LIMIT FOR SERUM ALCOHOL IS 5 mg/dL FOR MEDICAL PURPOSES ONLY   Salicylate level     Status: None   Collection Time: 03/05/15 10:50 AM  Result Value Ref Range   Salicylate Lvl 43.1 2.8 - 30.0 mg/dL  Acetaminophen level     Status: None   Collection Time: 03/05/15 10:50 AM  Result Value Ref Range   Acetaminophen (Tylenol), Serum 30 10 - 30 ug/mL    Comment:        THERAPEUTIC CONCENTRATIONS VARY SIGNIFICANTLY. A RANGE OF 10-30 ug/mL MAY BE AN EFFECTIVE CONCENTRATION FOR MANY PATIENTS. HOWEVER, SOME ARE BEST TREATED AT CONCENTRATIONS OUTSIDE THIS RANGE. ACETAMINOPHEN CONCENTRATIONS >150 ug/mL AT 4 HOURS AFTER INGESTION  AND >50 ug/mL AT 12 HOURS AFTER INGESTION ARE OFTEN ASSOCIATED WITH TOXIC REACTIONS.   CBC     Status: Abnormal   Collection Time: 03/05/15 10:50 AM  Result Value Ref Range   WBC 9.7 4.0 - 10.5 K/uL   RBC 4.27 3.87 - 5.11 MIL/uL   Hemoglobin 14.8 12.0 - 15.0 g/dL   HCT 41.7 36.0 - 46.0 %   MCV 97.7 78.0 - 100.0 fL   MCH 34.7 (H) 26.0 - 34.0 pg   MCHC 35.5 30.0 -  36.0 g/dL   RDW 12.5 11.5 - 15.5 %   Platelets 234 150 - 400 K/uL  I-Stat beta hCG blood, ED ([facility], [facility], [facility] only)     Status: None   Collection Time: 03/05/15 10:55 AM  Result Value Ref Range   I-stat hCG, quantitative <5.0 <5 mIU/mL   Comment 3            Comment:   GEST. AGE      CONC.  (mIU/mL)   <=1 WEEK        5 - 50     2 WEEKS       50 - 500     3 WEEKS       100 - 10,000     4 WEEKS     1,000 - 30,000        FEMALE AND NON-PREGNANT FEMALE:     LESS THAN 5 mIU/mL   Acetaminophen level     Status: None   Collection Time: 03/05/15  1:46 PM  Result Value Ref Range   Acetaminophen (Tylenol), Serum 22 10 - 30 ug/mL    Comment:        THERAPEUTIC CONCENTRATIONS VARY SIGNIFICANTLY. A RANGE OF 10-30 ug/mL MAY BE AN EFFECTIVE CONCENTRATION FOR MANY PATIENTS. HOWEVER, SOME ARE BEST TREATED AT CONCENTRATIONS OUTSIDE THIS RANGE. ACETAMINOPHEN CONCENTRATIONS >150 ug/mL AT 4 HOURS AFTER INGESTION AND >50 ug/mL AT 12 HOURS AFTER INGESTION ARE OFTEN ASSOCIATED WITH TOXIC REACTIONS.   Salicylate level     Status: None   Collection Time: 03/05/15  1:46 PM  Result Value Ref Range   Salicylate Lvl 30.0 2.8 - 30.0 mg/dL    Current Facility-Administered Medications  Medication Dose Route Frequency Provider Last Rate Last Dose  . alum & mag hydroxide-simeth (MAALOX/MYLANTA) 200-200-20 MG/5ML suspension 30 mL  30 mL Oral PRN Delos Haring, PA-C      . hydrochlorothiazide (MICROZIDE) capsule 12.5 mg  12.5 mg Oral Daily Delos Haring, PA-C   12.5 mg at 03/05/15 1523  . ibuprofen (ADVIL,MOTRIN) tablet 600 mg  600 mg Oral Q8H PRN Delos Haring, PA-C      . LORazepam (ATIVAN) tablet 1 mg  1 mg Oral Q8H PRN Delos Haring, PA-C      . nicotine (NICODERM CQ - dosed in mg/24 hours) patch 21 mg  21 mg Transdermal Daily Delos Haring, PA-C   21 mg at 03/05/15 1526  . ondansetron (ZOFRAN) tablet 4 mg  4 mg Oral Q8H PRN Delos Haring, PA-C      . potassium chloride (K-DUR,KLOR-CON) CR tablet 10  mEq  10 mEq Oral Daily Patrecia Pour, NP      . traZODone (DESYREL) tablet 100 mg  100 mg Oral QHS         Current Outpatient Prescriptions  Medication Sig Dispense Refill  . aspirin-acetaminophen-caffeine (EXCEDRIN MIGRAINE) 250-250-65 MG tablet Take 19 tablets by mouth once.    Marland Kitchen Dextromethorphan-Guaifenesin (ALKA-SELTZER PLUS MUCUS & CONG  PO) Take 1 tablet by mouth once.    Marland Kitchen amoxicillin-clavulanate (AUGMENTIN) 875-125 MG per tablet Take 1 tablet by mouth every 12 (twelve) hours. (Patient not taking: Reported on 03/05/2015) 14 tablet 0  . diclofenac (VOLTAREN) 75 MG EC tablet Take 1 tablet (75 mg total) by mouth 2 (two) times daily. (Patient not taking: Reported on 03/05/2015) 20 tablet 0  . hydrochlorothiazide (HYDRODIURIL) 12.5 MG tablet Take 1 tablet (12.5 mg total) by mouth daily. (Patient not taking: Reported on 03/05/2015) 90 tablet 1  . HYDROcodone-acetaminophen (NORCO/VICODIN) 5-325 MG per tablet Take 2 tablets by mouth every 4 (four) hours as needed. (Patient not taking: Reported on 03/05/2015) 10 tablet 0  . methocarbamol (ROBAXIN) 500 MG tablet Take 1 tablet (500 mg total) by mouth 2 (two) times daily. (Patient not taking: Reported on 03/05/2015) 20 tablet 0  . naproxen (NAPROSYN) 375 MG tablet Take 1 tablet (375 mg total) by mouth 2 (two) times daily. (Patient not taking: Reported on 03/05/2015) 20 tablet 0    Musculoskeletal: Strength & Muscle Tone: within normal limits Gait & Station: normal Patient leans: N/A  Psychiatric Specialty Exam: Review of Systems  Constitutional: Negative.   HENT: Negative.   Eyes: Negative.   Respiratory: Negative.   Cardiovascular: Negative.   Gastrointestinal: Negative.   Genitourinary: Negative.   Musculoskeletal: Negative.   Skin: Negative.   Neurological: Negative.   Endo/Heme/Allergies: Negative.   Psychiatric/Behavioral: Positive for depression and suicidal ideas.    Blood pressure 135/79, pulse 64, temperature 98 F  (36.7 C), temperature source Oral, resp. rate 18, SpO2 100 %.There is no weight on file to calculate BMI.  General Appearance: Disheveled  Eye Sport and exercise psychologist::  Fair  Speech:  Normal Rate  Volume:  Normal  Mood:  Irritable  Affect:  Congruent  Thought Process:  Coherent  Orientation:  Full (Time, Place, and Person)  Thought Content:  Rumination  Suicidal Thoughts:  Yes.  with intent/plan  Homicidal Thoughts:  No  Memory:  Immediate;   Fair Recent;   Fair Remote;   Fair  Judgement:  Poor  Insight:  Lacking  Psychomotor Activity:  Decreased  Concentration:  Fair  Recall:  [employer] of Knowledge:Fair  Language: Fair  Akathisia:  No  Handed:  Right  AIMS (if indicated):     Assets:  Housing Leisure Time Physical Health Resilience Social Support  ADL's:  Intact  Cognition: WNL  Sleep:      Treatment Plan Summary: Daily contact with patient to assess and evaluate symptoms and progress in treatment, Medication management and Plan major depression, single episode, severe without psychotic features: -Crisis stabilization -Medication management--ED covered medical needs and medications for overdose.  HCTZ 12.5 mg daily started for HTN with potassium 10 mEq for potassium wasting diuretic and hypokalemia.  Ativan 1 mg every 8 hours PRN anxiety and Trazodone 100 mg at bedtime for sleep issues started. -Individual counseling  Disposition: Recommend psychiatric Inpatient admission when medically cleared.  Waylan Boga, [place] 03/06/2015 11:00 AM Patient seen face-to-face for psychiatric evaluation, chart reviewed and case discussed with the physician extender and developed treatment plan. Reviewed the information documented and agree with the treatment plan. Corena Pilgrim, MD

## 2015-03-07 DIAGNOSIS — F322 Major depressive disorder, single episode, severe without psychotic features: Principal | ICD-10-CM

## 2015-03-07 DIAGNOSIS — T391X2A Poisoning by 4-Aminophenol derivatives, intentional self-harm, initial encounter: Secondary | ICD-10-CM

## 2015-03-07 DIAGNOSIS — R45851 Suicidal ideations: Secondary | ICD-10-CM

## 2015-03-07 DIAGNOSIS — T1491 Suicide attempt: Secondary | ICD-10-CM

## 2015-03-07 LAB — BASIC METABOLIC PANEL
ANION GAP: 8 (ref 5–15)
BUN: 9 mg/dL (ref 6–20)
CO2: 24 mmol/L (ref 22–32)
Calcium: 9.5 mg/dL (ref 8.9–10.3)
Chloride: 106 mmol/L (ref 101–111)
Creatinine, Ser: 0.86 mg/dL (ref 0.44–1.00)
Glucose, Bld: 102 mg/dL — ABNORMAL HIGH (ref 65–99)
POTASSIUM: 3.6 mmol/L (ref 3.5–5.1)
SODIUM: 138 mmol/L (ref 135–145)

## 2015-03-07 LAB — ACETAMINOPHEN LEVEL

## 2015-03-07 LAB — SALICYLATE LEVEL

## 2015-03-07 NOTE — Progress Notes (Signed)
D: Pt verbalizes that her OD was provoked by the anniversary of her losing her 34 yr old son 2 years ago. She denies any past suicide attempts. She reports a plan to call her sister during difficult times of coping. Pt is currently denying any suicidal ideation. Pt reports no past mental health history.  A: Continued support and availability as needed was extended to this pt. Staff continue to monitor pt with q6015min checks.  R: No adverse drug reactions noted. Pt receptive to treatment. Pt remains safe at this time.

## 2015-03-07 NOTE — [facility] Group Notes (Signed)
[facility] Group Notes:  (Nursing/MHT/Case Management/Adjunct)  Date:  03/07/2015  Time:  9:54 AM  Type of Therapy:  Psychoeducational Skills  Participation Level:  Active  Participation Quality:  Appropriate  Affect:  Appropriate  Cognitive:  Appropriate  Insight:  Appropriate  Engagement in Group:  Engaged  Modes of Intervention:  Discussion  Summary of Progress/Problems: Pt did attend self inventory group.      Jacquelyne BalintForrest, Jeremie Abdelaziz Shanta 03/07/2015, 9:54 AM

## 2015-03-07 NOTE — H&P (Signed)
Psychiatric Admission Assessment Adult  Patient Identification: Anita Fuentes MRN:  [id number] Date of Evaluation:  03/07/2015 Chief Complaint:  MDD Principal Diagnosis: <principal problem not specified> Diagnosis:   Patient Active Problem List   Diagnosis Date Noted  . Depression, major, single episode, severe ([place]) [F32.2] 03/06/2015  . Overdose of aspirin [T39.011A] 03/06/2015  . Major depressive disorder, single episode, severe without psychosis ([place]) [F32.2] 03/06/2015  . Suicide attempt by multiple drug overdose ([place]) [T50.902A]   . Risk for sexually transmitted disease [Z72.51] 12/27/2013  . Unspecified essential hypertension [I10] 12/27/2013  . Abdominal discomfort, generalized [R10.84] 12/27/2013  . Toothache [K08.89] 12/27/2013  . Tobacco dependence [F17.200] 12/27/2013  . Other malaise and fatigue [R53.81, R53.83] 12/27/2013  . Bilateral edema of lower extremity [R60.0] 12/27/2013  . Notalgia [M54.9] 12/27/2013  . Need for Tdap vaccination [Z23] 12/27/2013  . Vaginal pain [R10.2] 12/27/2013  . Depression [F32.9] 12/27/2013  . Exposure to STD [Z20.2] 11/04/2011  . History of anemia [Z86.2] 11/04/2011  . Pregnancy, normal, incidental [Z33.1] 09/05/2011  . History of cesarean section [Z98.891] 09/05/2011  . History of RPR test [Z92.89] 09/05/2011  . Tobacco abuse [Z72.0] 09/05/2011  . Syphilis in pregnancy, antepartum [O98.119] 09/05/2011   History of Present Illness: Anita Fuentes is an 34 y.o. female. Pt presents voluntarily to [facility] BIB EMS. Pt reports she ingested 19 excedrin pills and some alcohol. Pt sts she was "having a bad day". Pt denies that she was trying to commit suicide. She says that she was upset b/c her 59 mo old child died 25 mos ago and this would be the child's birthday month. Pt reports euthymic mood and her affect is mood congruent. Pt sts she works at [employer]. Pt sts she lives w/ her brother and pt's young child. Pt denies substance  abuse. She reports no hx of inpt or outpt MH treatment. She denies hx of suicide attempts and denies hx of self harm. Pt states she is ready to be d/c. Writer talks w/ pt about the possible consequences of overdose including death. Pt appears to minimize her Excedrin ingestion this am. Pt's sister is present at bedside. Sister reports pt texted her this am to say she had ingested several excedrin. Sister says she called EMS and they picked up pt. Writer ran pt by Waylan Boga DNP who recommends inpatient treatment.   Upon arrival to [facility], on 03/07/15, H&P performed: Pt seen and chart reviewed. Pt is alert/oriented x4, calm, cooperative, and appropriate. However, pt appears to be projecting an overly positive attitude in contrast with her recent depression and overdose and this becomes more evident as she becomes tearful when discussing her feelings of being overwhelmed with her baby's death and the anniversary of such. Therefore, her smiling may be more of a defense mechanism at time and we should closely monitor for subjective/objective inconsistencies. Pt minimizes suicidal ideation at this time and denies homicidal ideation. She denies psychosis or any history of such and does not appear to be responding to internal stimuli. 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 She lives with her brother and her 3 children.   Associated Signs/Symptoms: Depression Symptoms:  depressed mood, anhedonia, insomnia, psychomotor agitation, psychomotor retardation, feelings of worthlessness/guilt, difficulty concentrating, hopelessness, recurrent thoughts of death, suicidal thoughts with specific plan, suicidal attempt, anxiety, (Hypo) Manic Symptoms:  Impulsivity, Anxiety Symptoms:  Excessive Worry, Psychotic Symptoms:  Denies PTSD Symptoms: Had a traumatic exposure:  Baby died unexpectedly  Total Time spent with patient: 45 minutes  Past Psychiatric History: Denies  Risk to  Self: Is patient at risk for suicide?: Yes Risk to Others:   Prior Inpatient Therapy:   Prior Outpatient Therapy:    Alcohol Screening: 1. How often do you have a drink containing alcohol?: Never 9. Have you or someone else been injured as a result of your drinking?: No 10. Has a relative or friend or a doctor or another health worker been concerned about your drinking or suggested you cut down?: No Alcohol Use Disorder Identification Test Final Score (AUDIT): 0 Brief Intervention: AUDIT score less than 7 or less-screening does not suggest unhealthy drinking-brief intervention not indicated Substance Abuse History in the last 12 months:  No. Consequences of Substance Abuse: NA Previous Psychotropic Medications: No  Psychological Evaluations: No  Past Medical History:  Past Medical History  Diagnosis Date  . Migraines     TYPICALLY TAKES EXCEDERIN MIGRAINE  . History of chlamydia 1998  . History of gonorrhea   . Hx of syphilis 1998  . H/O candidiasis   . Hx: UTI (urinary tract infection)   . Yeast infection   . Abnormal Pap smear     Medication was treatment  . Exposure to STD 11/04/2011  . History of anemia 11/04/2011  . Anemia     Hx -during Second Pregnancy    Past Surgical History  Procedure Laterality Date  . Cesarean section  2003, 2004, 2008,2013    x 4  . Cesarean section  03/23/2012    Procedure: CESAREAN SECTION;  Surgeon: Ena Dawley, MD;  Location: [facility];  Service: Obstetrics;  Laterality: N/A;  Repeat   Family History:  Family History  Problem Relation Age of Onset  . Diabetes Father   . Hypertension Father   . Hypertension Sister   . Diabetes Sister     ADULT ONSET  . Hypertension Mother   . Heart disease Mother   . Diabetes Mother   . Thyroid disease Maternal Uncle   . Heart disease Maternal Grandmother   . Hypertension Maternal Grandmother   . Diabetes Maternal Grandmother   . Diabetes Paternal Grandmother    Family Psychiatric  History: Denies  any aside from MDD in a few relatives Social History:  History  Alcohol Use No    Comment: "     History  Drug Use No    Comment: Prior to positive UPT - Last use August/September 2013  Denies 03/13/2014    Social History   Social History  . Marital Status: Single    Spouse Name: N/A  . Number of Children: N/A  . Years of Education: 30   Social History Main Topics  . Smoking status: Former Smoker -- 1.00 packs/day for 15 years    Types: Cigarettes    Quit date: 01/03/2014  . Smokeless tobacco: Never Used     Comment: PT COUNSELED TO TRY DECREASING AMOUT SHE SMOKES A DAY UNTIL SHE CAN EVENTUALLY QUIT  . Alcohol Use: No     Comment: "  . Drug Use: No     Comment: Prior to positive UPT - Last use August/September 2013  Denies 03/13/2014  . Sexual Activity: Not Currently    Birth Control/ Protection: None     Comment: DEPO PROVERA   Other Topics Concern  . None   Social History Narrative   Additional Social History:    Pain Medications: n/a Name of Substance 1: n/a  Allergies:  No Known Allergies Lab Results:  Results for orders placed or performed during the hospital encounter of 03/06/15 (from the past 48 hour(s))  Acetaminophen level     Status: Abnormal   Collection Time: 03/07/15  6:19 AM  Result Value Ref Range   Acetaminophen (Tylenol), Serum <10 (L) 10 - 30 ug/mL    Comment:        THERAPEUTIC CONCENTRATIONS VARY SIGNIFICANTLY. A RANGE OF 10-30 ug/mL MAY BE AN EFFECTIVE CONCENTRATION FOR MANY PATIENTS. HOWEVER, SOME ARE BEST TREATED AT CONCENTRATIONS OUTSIDE THIS RANGE. ACETAMINOPHEN CONCENTRATIONS >150 ug/mL AT 4 HOURS AFTER INGESTION AND >50 ug/mL AT 12 HOURS AFTER INGESTION ARE OFTEN ASSOCIATED WITH TOXIC REACTIONS. Performed at [facility] metabolic panel     Status: Abnormal   Collection Time: 03/07/15  6:19 AM  Result Value Ref Range   Sodium 138 135 - 145 mmol/L   Potassium 3.6 3.5 - 5.1  mmol/L   Chloride 106 101 - 111 mmol/L   CO2 24 22 - 32 mmol/L   Glucose, Bld 102 (H) 65 - 99 mg/dL   BUN 9 6 - 20 mg/dL   Creatinine, Ser 0.86 0.44 - 1.00 mg/dL   Calcium 9.5 8.9 - 10.3 mg/dL   GFR calc non Af Amer >60 >60 mL/min   GFR calc Af Amer >60 >60 mL/min    Comment: (NOTE) The eGFR has been calculated using the CKD EPI equation. This calculation has not been validated in all clinical situations. eGFR's persistently <60 mL/min signify possible Chronic Kidney Disease.    Anion gap 8 5 - 15    Comment: Performed at [facility]  Salicylate level     Status: None   Collection Time: 03/07/15  6:19 AM  Result Value Ref Range   Salicylate Lvl <2.9 2.8 - 30.0 mg/dL    Comment: Performed at [facility]    Metabolic Disorder Labs:  No results found for: HGBA1C, MPG No results found for: PROLACTIN No results found for: CHOL, TRIG, HDL, CHOLHDL, VLDL, LDLCALC  Current Medications: Current Facility-Administered Medications  Medication Dose Route Frequency Provider Last Rate Last Dose  . acetaminophen (TYLENOL) tablet 650 mg  650 mg Oral Q6H PRN Patrecia Pour, NP   650 mg at 03/06/15 1834  . alum & mag hydroxide-simeth (MAALOX/MYLANTA) 200-200-20 MG/5ML suspension 30 mL  30 mL Oral Q4H PRN Patrecia Pour, NP      . hydrochlorothiazide (MICROZIDE) capsule 12.5 mg  12.5 mg Oral Daily Patrecia Pour, NP   12.5 mg at 03/07/15 0817  . ibuprofen (ADVIL,MOTRIN) tablet 600 mg  600 mg Oral Q8H PRN Patrecia Pour, NP      . Influenza vac split quadrivalent PF (FLUARIX) injection 0.5 mL  0.5 mL Intramuscular Tomorrow-1000 Fernando A Cobos, MD      . LORazepam (ATIVAN) tablet 1 mg  1 mg Oral Q8H PRN Patrecia Pour, NP      . magnesium hydroxide (MILK OF MAGNESIA) suspension 30 mL  30 mL Oral Daily PRN Patrecia Pour, NP      . nicotine (NICODERM CQ - dosed in mg/24 hours) patch 21 mg  21 mg Transdermal Daily Patrecia Pour, NP      . ondansetron [facility])  tablet 4 mg  4 mg Oral Q8H PRN Patrecia Pour, NP      . potassium chloride (K-DUR,KLOR-CON) CR tablet 10 mEq  10 mEq Oral Daily Theodoro Clock  Leander Rams, NP   10 mEq at 03/07/15 0817  . traZODone (DESYREL) tablet 100 mg  100 mg Oral QHS Patrecia Pour, NP   100 mg at 03/06/15 2200   PTA Medications: Prescriptions prior to admission  Medication Sig Dispense Refill Last Dose  . diclofenac (VOLTAREN) 75 MG EC tablet Take 1 tablet (75 mg total) by mouth 2 (two) times daily. (Patient not taking: Reported on 03/05/2015) 20 tablet 0 Unknown at Unknown time  . hydrochlorothiazide (HYDRODIURIL) 12.5 MG tablet Take 1 tablet (12.5 mg total) by mouth daily. (Patient not taking: Reported on 03/05/2015) 90 tablet 1 Unknown at Unknown time    Musculoskeletal: Strength & Muscle Tone: within normal limits Gait & Station: normal Patient leans: N/A  Psychiatric Specialty Exam: Physical Exam  Review of Systems  Psychiatric/Behavioral: Positive for depression and suicidal ideas (overdose yet minimizing at this time). Negative for hallucinations and substance abuse. The patient is nervous/anxious and has insomnia.   All other systems reviewed and are negative.   Blood pressure 114/98, pulse 91, temperature 98.5 F (36.9 C), temperature source Oral, resp. rate 16, height 5' 3" (1.6 m), weight 69.4 kg (153 lb), last menstrual period 03/05/2015, SpO2 14 %.Body mass index is 27.11 kg/(m^2).  General Appearance: Casual and Fairly Groomed  Eye Contact::  Good  Speech:  Clear and Coherent and Normal Rate  Volume:  Normal  Mood:  Anxious and Depressed  Affect:  Non-Congruent, Depressed and Tearful  Thought Process:  Circumstantial  Orientation:  Full (Time, Place, and Person)  Thought Content:  WDL  Suicidal Thoughts:  Yes.  with intent/plan  Homicidal Thoughts:  No  Memory:  Immediate;   Fair Recent;   Fair Remote;   Fair  Judgement:  Fair  Insight:  Fair  Psychomotor Activity:  Normal  Concentration:  Fair   Recall:  [employer] of Knowledge:Fair  Language: Fair  Akathisia:  No  Handed:    AIMS (if indicated):     Assets:  Communication Skills Desire for Improvement Leisure Time Physical Health Resilience Social Support  ADL's:  Intact  Cognition: WNL  Sleep:       Treatment Plan Summary: Daily contact with patient to assess and evaluate symptoms and progress in treatment   Medications:  -Pt refuses medication management at this time and wants to go to groups. Will continue to discuss with pt as she has been depressed for a long time and this does not appear to be acute.   Observation Level/Precautions:  15 minute checks  Laboratory:  Labs resulted, reviewed, and stable at this time.   Psychotherapy:  Group therapy, individual therapy, psychoeducation  Medications:  See MAR above  Consultations: None    Discharge Concerns: None    Estimated LOS: 5-7 days  Other:  N/A    I certify that inpatient services furnished can reasonably be expected to improve the patient's condition.    Camora Tremain C, FNP-BC 11/5/201610:27 AM

## 2015-03-07 NOTE — [facility] Group Notes (Signed)
[facility] Group Notes:  (Clinical Social Work)  03/07/2015     1:15-2:15PM  Summary of Progress/Problems:   The main focus of today's process group was to discuss patient feelings about being in the hospital and how they can focus on the positive aspects of it.  Comparison and contrast was done of people's feelings and symptoms, and they provided ideas and supports to each other.  Anita MaxcySyretta stated she does not need to be in the hospital, but she understands that she made a mistake in not reaching out for help.  She said she will call her sister next time she feels bad, and not act impulsively.  Type of Therapy:  Group Therapy - Process   Participation Level:  Active  Participation Quality:  Appropriate, Attentive and Sharing  Affect:  Appropriate  Cognitive:  Appropriate and Oriented  Insight:  Engaged  Engagement in Therapy:  Engaged  Modes of Intervention:  Education, Motivational Interviewing  Ambrose MantleMareida Grossman-Orr, LCSW 03/07/2015, 3:16 PM

## 2015-03-07 NOTE — [facility] Suicide Risk Assessment (Signed)
[facility] Admission Suicide Risk Assessment   Nursing information obtained from:   pt Demographic factors:   single Current Mental Status:    recent impulsive SA by OD. Denies current access to guns Loss Factors:   death of her infant son with his birthday coming up  Historical Factors:    denies hx of suicide attempt, denies drug use; denies family hx of suicide. Risk Reduction Factors:   living with family, has 3 children who live with her, employed, religious beliefs about suicide Total Time spent with patient: 30 minutes Principal Problem: <principal problem not specified> Diagnosis:   Patient Active Problem List   Diagnosis Date Noted  . Depression, major, single episode, severe ([facility]) [F32.2] 03/06/2015  . Overdose of aspirin [T39.011A] 03/06/2015  . Major depressive disorder, single episode, severe without psychosis ([facility]) [F32.2] 03/06/2015  . Suicide attempt by multiple drug overdose ([facility]) [T50.902A]   . Risk for sexually transmitted disease [Z72.51] 12/27/2013  . Unspecified essential hypertension [I10] 12/27/2013  . Abdominal discomfort, generalized [R10.84] 12/27/2013  . Toothache [K08.89] 12/27/2013  . Tobacco dependence [F17.200] 12/27/2013  . Other malaise and fatigue [R53.81, R53.83] 12/27/2013  . Bilateral edema of lower extremity [R60.0] 12/27/2013  . Notalgia [M54.9] 12/27/2013  . Need for Tdap vaccination [Z23] 12/27/2013  . Vaginal pain [R10.2] 12/27/2013  . Depression [F32.9] 12/27/2013  . Exposure to STD [Z20.2] 11/04/2011  . History of anemia [Z86.2] 11/04/2011  . Pregnancy, normal, incidental [Z33.1] 09/05/2011  . History of cesarean section [Z98.891] 09/05/2011  . History of RPR test [Z92.89] 09/05/2011  . Tobacco abuse [Z72.0] 09/05/2011  . Syphilis in pregnancy, antepartum [O98.119] 09/05/2011     Continued Clinical Symptoms:  Alcohol Use Disorder Identification Test Final Score (AUDIT): 0 The "Alcohol Use Disorders Identification Test", Guidelines for Use  in Primary Care, Second Edition.  World Science writer [facility]). Score between 0-7:  no or low risk or alcohol related problems. Score between 8-15:  moderate risk of alcohol related problems. Score between 16-19:  high risk of alcohol related problems. Score 20 or above:  warrants further diagnostic evaluation for alcohol dependence and treatment.   CLINICAL FACTORS:   Depression:   Impulsivity   Pt states her infant son died 86mo ago and his birthday is coming on November 22nd. She began thinking about him while alone (daughter was in school and sons with their grandmother). Pt impulsively took 65 Excedrin and texted several family members. States she was not trying to kill herself and doesn't know why she did it. States it was the Devil's work because she believes it is a sin to commit suicide. Today states it was big mistake. Reports on/depression and crying spells when thinking about her son. Today denies hopelessness, worthlessness, anhedonia and SI/HI. 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 She has 3 other kids she wants to live for. Pt will drink 1-2 shots a couple of times a month. She does smoke cigs and is not ready to quit. Denies illicit drug use.    Musculoskeletal: Strength & Muscle Tone: within normal limits Gait & Station: normal Patient leans: N/A  Psychiatric Specialty Exam: Physical Exam  Review of Systems  Constitutional: Negative for fever and chills.  HENT: Negative for ear pain, nosebleeds and sore throat.   Eyes: Negative for blurred vision.  Respiratory: Negative for cough and wheezing.   Cardiovascular: Negative for chest pain and leg swelling.  Gastrointestinal: Negative for heartburn, nausea, vomiting and abdominal pain.  Musculoskeletal: Negative for back pain, joint pain and neck pain.  Skin: Negative for itching and rash.  Neurological: Positive for headaches. Negative for dizziness, tremors, seizures and loss of  consciousness.  Psychiatric/Behavioral: Negative for depression, suicidal ideas, hallucinations and substance abuse. The patient is not nervous/anxious and does not have insomnia.     Blood pressure 114/98, pulse 91, temperature 98.5 F (36.9 C), temperature source Oral, resp. rate 16, height 5\' 3"  (1.6 m), weight 69.4 kg (153 lb), last menstrual period 03/05/2015, SpO2 14 %.Body mass index is 27.11 kg/(m^2).  General Appearance: Casual  Eye Contact::  Good  Speech:  Clear and Coherent and Normal Rate  Volume:  Normal  Mood:  Euthymic  Affect:  Full Range  Thought Process:  Goal Directed  Orientation:  Full (Time, Place, and Person)  Thought Content:  Negative  Suicidal Thoughts:  No  Homicidal Thoughts:  No  Memory:  Immediate;   Good Recent;   Good Remote;   Good  Judgement:  Poor  Insight:  Good  Psychomotor Activity:  Normal  Concentration:  Good  Recall:  Good  Fund of Knowledge:Good  Language: Good  Akathisia:  No  Handed:  Right  AIMS (if indicated):     Assets:  Communication Skills Desire for Improvement Housing Social Support Talents/Skills Transportation  Sleep:     Cognition: WNL  ADL's:  Intact     COGNITIVE FEATURES THAT CONTRIBUTE TO RISK:  Closed-mindedness and Polarized thinking    SUICIDE RISK:   Severe:  Frequent, intense, and enduring suicidal ideation, specific plan, no subjective intent, but some objective markers of intent (i.e., choice of lethal method), the method is accessible, some limited preparatory behavior, evidence of impaired self-control, severe dysphoria/symptomatology, multiple risk factors present, and few if any protective factors, particularly a lack of social support.  PLAN OF CARE: Pt refused treatment with antidepressants but is will to f/up outpatient with psychiatry and is asking for f/c on Monday. Pt may be minimizing symptoms in an effort to get d/c. Will Will continue to monitor.  Pt encouraged to continue attending  groups, engaging with staff and peers. Reviewed labs- glu 102   Medical Decision Making:  Established Problem, Stable/Improving (1), Review of Psycho-Social Stressors (1) and Review or order clinical lab tests (1)  I certify that inpatient services furnished can reasonably be expected to improve the patient's condition.   Yichen Gilardi 03/07/2015, 10:06 AM

## 2015-03-07 NOTE — Progress Notes (Signed)
D Joline MaxcySyretta is adjusting well to being in the hospital. She is getting along well with all the patients and she is engaged in her recovery as evidenced by her presence in the groups and her discussion during the groups as well. She completed her daily assessment and on it she wrote she deneid SI today and she rated her depression, hopelessness and anxiety " 0/0/0/", respectively . A SHe laughs and jokes with her peers, with this Clinical research associatewriter, with anybody that will joke with her. She is encouraged to stay focused on her emotional problems and she is encouraged by this Clinical research associatewriter when she is forthcoming in group.... To be honest with herslef and allow herslef to feel sad..swelling of the ankles she can begin her healing. R She allows this nurse to speak wth her about her son that dies and she allows nurse to process with her. Safety in place.

## 2015-03-07 NOTE — Progress Notes (Signed)
Adult Psychoeducational Group Note  Date:  03/07/2015 Time:  9:28 PM  Group Topic/Focus:  Wrap-Up Group:   The focus of this group is to help patients review their daily goal of treatment and discuss progress on daily workbooks.  Participation Level:  Active  Participation Quality:  Appropriate and Attentive  Affect:  Appropriate  Cognitive:  Appropriate  Insight: Appropriate and Good  Engagement in Group:  Engaged  Modes of Intervention:  Education  Additional Comments:  Pt stated she overall had a wonderful day. Pt goal for tomorrow is to progress more than she did today.    Merlinda FrederickKeshia S Mina Carlisi 03/07/2015, 9:28 PM

## 2015-03-07 NOTE — Progress Notes (Signed)
D: Rates Depression 0/10 Anxiety 0/10. She denies SI/HI/AVH. Anita Fuentes signed a 72hr request for discharge this evening at 2100. 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 She is pleasant, conversant and very remorseful about the events that led her here. She states it was a "poor decision" and she has never attempted anything like this before. She is hopeful for discharge by Monday as she does not want to lose her job. She is willing to comply with any outpatient treatment needed in order to be able to go home. She has attended groups on the unit. She is interactive with other patients on the unit.  A: Encouragement and support given.  R: Continue to monitor for patient safety and medication effectiveness.

## 2015-03-07 NOTE — [facility] Group Notes (Signed)
[facility] Group Notes:  (Nursing/MHT/Case Management/Adjunct)  Date:  03/07/2015  Time:  11:39 AM  Type of Therapy:  Psychoeducational Skills  Participation Level:  Active  Participation Quality:  Appropriate  Affect:  Appropriate  Cognitive:  Appropriate  Insight:  Appropriate  Engagement in Group:  Engaged  Modes of Intervention:  Discussion  Summary of Progress/Problems: Pt did attend self healthy coping skills group.   Zakk Borgen Shanta 03/07/2015, 11:39 AM 

## 2015-03-08 NOTE — Progress Notes (Signed)
Pt reports she is doing well and is looking forward to being discharged.  She tells Clinical research associatewriter that taking the overdose of medication was "a stupid thing to do", and she missed her three children at home.  She denies SI/HI/AVH.  She has been pleasant and appropriate on the unit this evening.  Pt plans to return home at discharge.  She does not feel she needs an anti-depressant at this time.  Support and encouragement offered.  Pt makes her needs known to staff.  Safety maintained with q15 minute checks.

## 2015-03-08 NOTE — Progress Notes (Signed)
Adult Psychoeducational Group Note  Date:  03/08/2015 Time:  9:04 PM  Group Topic/Focus:  Wrap-Up Group:   The focus of this group is to help patients review their daily goal of treatment and discuss progress on daily workbooks.  Participation Level:  Active  Participation Quality:  Appropriate and Attentive  Affect:  Appropriate  Cognitive:  Appropriate  Insight: Appropriate and Good  Engagement in Group:  Engaged  Modes of Intervention:  Education  Additional Comments:  Pt had a good day. Pt goal is to go home and be better tomorrow then she was today.   Merlinda FrederickKeshia S Shaliyah Taite 03/08/2015, 9:04 PM

## 2015-03-08 NOTE — Progress Notes (Signed)
[facility] MD Progress Note  03/08/2015 10:41 AM Marlou Starks SHERMAN LIPUMA  MRN:  [id number] Subjective: pt states she is "fine". Pt mistakenly signed a 72 hr notice and was not aware she is under IVC.  States she made a mistake and was looking for attention. Pt states she has learned her lesson. She wants to has 3 kids to live for and has a job.   States she has been through a lot and if she survived her past then she can survive anything. Pt states she was ignorant and she was not trying to hurt herself.  Today denies depression and anxiety. Pt reports she is crying some because she misses her children and is stressed. Denies SI/HI. Denies AVH. States she has a support system. Pt is ready for d/c.   O: pt seen and chart reviewed. Pt continues to deny depression but is endorsing some anxiety about discharge. Pt has been attending groups and is social with peers. No disruptive behaviors noted. Pt is showing improved insight into her behavior.   Principal Problem: Major depressive disorder, single episode, severe without psychosis (Marvin) Diagnosis:   Patient Active Problem List   Diagnosis Date Noted  . Depression, major, single episode, severe (Henderson) [F32.2] 03/06/2015  . Overdose of aspirin [T39.011A] 03/06/2015  . Major depressive disorder, single episode, severe without psychosis (Reynolds) [F32.2] 03/06/2015  . Suicide attempt by multiple drug overdose (Rogers) [T50.902A]   . Risk for sexually transmitted disease [Z72.51] 12/27/2013  . Unspecified essential hypertension [I10] 12/27/2013  . Abdominal discomfort, generalized [R10.84] 12/27/2013  . Toothache [K08.89] 12/27/2013  . Tobacco dependence [F17.200] 12/27/2013  . Other malaise and fatigue [R53.81, R53.83] 12/27/2013  . Bilateral edema of lower extremity [R60.0] 12/27/2013  . Notalgia [M54.9] 12/27/2013  . Need for Tdap vaccination [Z23] 12/27/2013  . Vaginal pain [R10.2] 12/27/2013  . Depression [F32.9] 12/27/2013  . Exposure to STD [Z20.2] 11/04/2011   . History of anemia [Z86.2] 11/04/2011  . Pregnancy, normal, incidental [Z33.1] 09/05/2011  . History of cesarean section [Z98.891] 09/05/2011  . History of RPR test [Z92.89] 09/05/2011  . Tobacco abuse [Z72.0] 09/05/2011  . Syphilis in pregnancy, antepartum [O98.119] 09/05/2011   Total Time spent with patient: 20 minutes  Past Psychiatric History:  She reports no hx of inpt or outpt MH treatment. She denies hx of suicide attempts and denies hx of self harm.   Past Medical History:  Past Medical History  Diagnosis Date  . Migraines     TYPICALLY TAKES EXCEDERIN MIGRAINE  . History of chlamydia 1998  . History of gonorrhea   . Hx of syphilis 1998  . H/O candidiasis   . Hx: UTI (urinary tract infection)   . Yeast infection   . Abnormal Pap smear     Medication was treatment  . Exposure to STD 11/04/2011  . History of anemia 11/04/2011  . Anemia     Hx -during Second Pregnancy    Past Surgical History  Procedure Laterality Date  . Cesarean section  2003, 2004, 2008,2013    x 4  . Cesarean section  03/23/2012    Procedure: CESAREAN SECTION;  Surgeon: Ena Dawley, MD;  Location: [facility];  Service: Obstetrics;  Laterality: N/A;  Repeat   Family History:  Family History  Problem Relation Age of Onset  . Diabetes Father   . Hypertension Father   . Hypertension Sister   . Diabetes Sister     ADULT ONSET  . Hypertension Mother   . Heart disease  Mother   . Diabetes Mother   . Thyroid disease Maternal Uncle   . Heart disease Maternal Grandmother   . Hypertension Maternal Grandmother   . Diabetes Maternal Grandmother   . Diabetes Paternal Grandmother    Family Psychiatric  History: MDD in a few relatives Social History:  History  Alcohol Use No    Comment: "     History  Drug Use No    Comment: Prior to positive UPT - Last use August/September 2013  Denies 03/13/2014    Social History   Social History  . Marital Status: Single    Spouse Name: N/A  . Number of  Children: N/A  . Years of Education: 43   Social History Main Topics  . Smoking status: Former Smoker -- 1.00 packs/day for 15 years    Types: Cigarettes    Quit date: 01/03/2014  . Smokeless tobacco: Never Used     Comment: PT COUNSELED TO TRY DECREASING AMOUT SHE SMOKES A DAY UNTIL SHE CAN EVENTUALLY QUIT  . Alcohol Use: No     Comment: "  . Drug Use: No     Comment: Prior to positive UPT - Last use August/September 2013  Denies 03/13/2014  . Sexual Activity: Not Currently    Birth Control/ Protection: None     Comment: DEPO PROVERA   Other Topics Concern  . None   Social History Narrative   Additional Social History:   Pt sts she works at [employer]. Pt sts she lives w/ her brother and pt's young child. Pt denies substance abuse.   Pain Medications: n/a Name of Substance 1: n/a                  Sleep: Good  Appetite:  Good  Current Medications: Current Facility-Administered Medications  Medication Dose Route Frequency Provider Last Rate Last Dose  . acetaminophen (TYLENOL) tablet 650 mg  650 mg Oral Q6H PRN Charm Rings, NP   650 mg at 03/06/15 1834  . alum & mag hydroxide-simeth (MAALOX/MYLANTA) 200-200-20 MG/5ML suspension 30 mL  30 mL Oral Q4H PRN Charm Rings, NP      . hydrochlorothiazide (MICROZIDE) capsule 12.5 mg  12.5 mg Oral Daily Charm Rings, NP   12.5 mg at 03/08/15 0818  . ibuprofen (ADVIL,MOTRIN) tablet 600 mg  600 mg Oral Q8H PRN Charm Rings, NP      . Influenza vac split quadrivalent PF (FLUARIX) injection 0.5 mL  0.5 mL Intramuscular Tomorrow-1000 Fernando A Cobos, MD      . LORazepam (ATIVAN) tablet 1 mg  1 mg Oral Q8H PRN Charm Rings, NP      . magnesium hydroxide (MILK OF MAGNESIA) suspension 30 mL  30 mL Oral Daily PRN Charm Rings, NP      . ondansetron [facility]) tablet 4 mg  4 mg Oral Q8H PRN Charm Rings, NP      . potassium chloride (K-DUR,KLOR-CON) CR tablet 10 mEq  10 mEq Oral Daily Charm Rings, NP   10 mEq  at 03/08/15 0818  . traZODone (DESYREL) tablet 100 mg  100 mg Oral QHS Charm Rings, NP   100 mg at 03/06/15 2200    Lab Results:  Results for orders placed or performed during the hospital encounter of 03/06/15 (from the past 48 hour(s))  Acetaminophen level     Status: Abnormal   Collection Time: 03/07/15  6:19 AM  Result Value Ref Range  Acetaminophen (Tylenol), Serum <10 (L) 10 - 30 ug/mL    Comment:        THERAPEUTIC CONCENTRATIONS VARY SIGNIFICANTLY. A RANGE OF 10-30 ug/mL MAY BE AN EFFECTIVE CONCENTRATION FOR MANY PATIENTS. HOWEVER, SOME ARE BEST TREATED AT CONCENTRATIONS OUTSIDE THIS RANGE. ACETAMINOPHEN CONCENTRATIONS >150 ug/mL AT 4 HOURS AFTER INGESTION AND >50 ug/mL AT 12 HOURS AFTER INGESTION ARE OFTEN ASSOCIATED WITH TOXIC REACTIONS. Performed at [facility]   Basic metabolic panel     Status: Abnormal   Collection Time: 03/07/15  6:19 AM  Result Value Ref Range   Sodium 138 135 - 145 mmol/L   Potassium 3.6 3.5 - 5.1 mmol/L   Chloride 106 101 - 111 mmol/L   CO2 24 22 - 32 mmol/L   Glucose, Bld 102 (H) 65 - 99 mg/dL   BUN 9 6 - 20 mg/dL   Creatinine, Ser 7.69 0.44 - 1.00 mg/dL   Calcium 9.5 8.9 - 07.4 mg/dL   GFR calc non Af Amer >60 >60 mL/min   GFR calc Af Amer >60 >60 mL/min    Comment: (NOTE) The eGFR has been calculated using the CKD EPI equation. This calculation has not been validated in all clinical situations. eGFR's persistently <60 mL/min signify possible Chronic Kidney Disease.    Anion gap 8 5 - 15    Comment: Performed at [facility]  Salicylate level     Status: None   Collection Time: 03/07/15  6:19 AM  Result Value Ref Range   Salicylate Lvl <4.0 2.8 - 30.0 mg/dL    Comment: Performed at [facility]    Physical Findings: AIMS: Facial and Oral Movements Muscles of Facial Expression: None, normal Lips and Perioral Area: None, normal Jaw: None, normal Tongue: None,  normal,Extremity Movements Upper (arms, wrists, hands, fingers): None, normal Lower (legs, knees, ankles, toes): None, normal, Trunk Movements Neck, shoulders, hips: None, normal, Overall Severity Severity of abnormal movements (highest score from questions above): None, normal Incapacitation due to abnormal movements: None, normal Patient's awareness of abnormal movements (rate only patient's report): No Awareness, Dental Status Current problems with teeth and/or dentures?: No Does patient usually wear dentures?: No  CIWA:  CIWA-Ar Total: 1 COWS:  COWS Total Score: 0  Musculoskeletal: Strength & Muscle Tone: within normal limits Gait & Station: normal Patient leans: N/A  Psychiatric Specialty Exam: Review of Systems  Constitutional: Negative for fever and chills.  Respiratory: Negative for cough and shortness of breath.   Cardiovascular: Negative for chest pain.  Gastrointestinal: Negative for nausea, vomiting, abdominal pain and constipation.  Musculoskeletal: Negative for back pain, joint pain and neck pain.  Skin: Negative for itching and rash.  Neurological: Positive for headaches.  Psychiatric/Behavioral: Negative.     Blood pressure 127/94, pulse 96, temperature 98 F (36.7 C), temperature source Oral, resp. rate 16, height 5\' 3"  (1.6 m), weight 69.4 kg (153 lb), last menstrual period 03/05/2015, SpO2 14 %.Body mass index is 27.11 kg/(m^2).  General Appearance: Casual  Eye Contact::  Good  Speech:  Clear and Coherent and Normal Rate  Volume:  Normal  Mood:  Euthymic  Affect:  Congruent  Thought Process:  Circumstantial  Orientation:  Full (Time, Place, and Person)  Thought Content:  Negative  Suicidal Thoughts:  No  Homicidal Thoughts:  No  Memory:  Immediate;   Good Recent;   Good Remote;   Good  Judgement:  Poor  Insight:  Present  Psychomotor Activity:  Normal  Concentration:  Good  Recall:  Good  Fund of Knowledge:Good  Language: Good  Akathisia:  No   Handed:  Right  AIMS (if indicated):     Assets:  Communication Skills Desire for Improvement Housing Social Support Talents/Skills Transportation  ADL's:  Intact  Cognition: WNL  Sleep:  Number of Hours: 7.75   Treatment Plan Summary: Daily contact with patient to assess and evaluate symptoms and progress in treatment -Pt refuses medication management at this time and wants to go to groups. Will continue to discuss with pt as she has been depressed for a long time and this does not appear to be acute. Pt is willing to f/up outpt for therapy.  -possible d/c on Monday if pt continues to deny symptoms   Ragina Fenter 03/08/2015, 10:41 AM

## 2015-03-08 NOTE — Progress Notes (Signed)
Adult Psychoeducational Group Note  Date:  03/08/2015 Time:  0900  Group Topic/Focus:  Orientation:   The focus of this group is to educate the patient on the purpose and policies of crisis stabilization and provide a format to answer questions about their admission.  The group details unit policies and expectations of patients while admitted.  Participation Level:  Active  Participation Quality:  Appropriate  Affect:  Appropriate  Cognitive:  Appropriate  Insight: Appropriate  Engagement in Group:  Engaged  Modes of Intervention:  Orientation  Additional Comments:    Earleen Aoun L 03/08/2015, 10:11 AM
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
## 2015-03-08 NOTE — BHH Group Notes (Signed)
B[facility] Group Notes:  (Clinical Social Work)  03/08/2015  1:15-2:15PM  Summary of Progress/Problems:   The main focus of today's process group was to   1)  discuss the importance of adding supports  2)  define health supports versus unhealthy supports  3)  identify the patient's current unhealthy supports and plan how to handle them  4)  Identify the patient's current healthy supports and plan what to add.  An emphasis was placed on using counselor, doctor, therapy groups, 12-step groups, and problem-specific support groups to expand supports.    The patient expressed full comprehension of the concepts presented, and agreed that there is a need to add more supports.  The patient stated her sister is a big support and she does not intend to be hospitalized ever again for not reaching out to her supports.  Type of Therapy:  Process Group with Motivational Interviewing  Participation Level:  Active  Participation Quality:  Attentive and Sharing  Affect:  Appropriate  Cognitive:  Appropriate  Insight:  Engaged  Engagement in Therapy:  Engaged  Modes of Intervention:   Education, Support and Processing, Activity  Ambrose MantleMareida Grossman-Orr, LCSW 03/08/2015

## 2015-03-08 NOTE — Progress Notes (Signed)
Adult Psychoeducational Group Note  Date:  03/08/2015 Time:  0900  Group Topic/Focus:  Orientation:   The focus of this group is to educate the patient on the purpose and policies of crisis stabilization and provide a format to answer questions about their admission.  The group details unit policies and expectations of patients while admitted.  Participation Level:  Active  Participation Quality:  Appropriate  Affect:  Appropriate  Cognitive:  Appropriate  Insight: Appropriate  Engagement in Group:  Engaged  Modes of Intervention:  Orientation   Additional Comments:    Teneil Shiller L 03/08/2015, 10:14 AM

## 2015-03-09 NOTE — B[facility] Suicide Risk Assessment (Signed)
[facility] INPATIENT:  Family/Significant Other Suicide Prevention Education  Suicide Prevention Education:  Education Completed; Anita Fuentes, Pt's sister [phone],  has been identified by the patient as the family member/significant other with whom the patient will be residing, and identified as the person(s) who will aid the patient in the event of a mental health crisis (suicidal ideations/suicide attempt).  With written consent from the patient, the family member/significant other has been provided the following suicide prevention education, prior to the and/or following the discharge of the patient.  The suicide prevention education provided includes the following:  Suicide risk factors  Suicide prevention and interventions  National Suicide Hotline telephone number  [facility] assessment telephone number  [facility] Emergency Assistance 911  [facility] and/or Residential Mobile Crisis Unit telephone number  Request made of family/significant other to:  Remove weapons (e.g., guns, rifles, knives), all items previously/currently identified as safety concern.    Remove drugs/medications (over-the-counter, prescriptions, illicit drugs), all items previously/currently identified as a safety concern.  The family member/significant other verbalizes understanding of the suicide prevention education information provided.  The family member/significant other agrees to remove the items of safety concern listed above.  Anita Fuentes, Anita Fuentes M 03/09/2015, 10:23 AM

## 2015-03-09 NOTE — B[facility] Group Notes (Signed)
Adult Psychoeducational Group Note  Date:  03/09/2015 Time:  10:36 PM  Group Topic/Focus:  Wrap-Up Group:   The focus of this group is to help patients review their daily goal of treatment and discuss progress on daily workbooks.  Participation Level:  Active  Participation Quality:  Appropriate  Affect:  Appropriate  Cognitive:  Appropriate  Insight: Good  Engagement in Group:  Engaged  Modes of Intervention:  Discussion  Additional Comments:  Lester KinsmanSyreeta stated her day was "good, wonderful and excellent".  She stated she is discharging tomorrow morning.  Her goal was to talk to the doctor.  Caroll RancherLindsay, Jeff Mccallum A 03/09/2015, 10:36 PM

## 2015-03-09 NOTE — Progress Notes (Signed)
Recreation Therapy Notes  Date: 11.07.2016 Time: 9:30am Location: [address] Dayroom  Group Topic: Stress Management  Goal Area(s) Addresses:  Patient will actively participate in stress management techniques presented during session.   Behavioral Response: Did not attend.   Marykay Lexenise L Tamma Brigandi, LRT/CTRS        Olajuwon Fosdick L 03/09/2015 10:30 AM

## 2015-03-09 NOTE — [facility] Group Notes (Signed)
[facility] LCSW Group Therapy  03/09/2015 1:15pm  Type of Therapy:  Group Therapy vercoming Obstacles  Participation Level:  Active  Participation Quality:  Monopolizing  Affect:  Euphoric  Cognitive:  Appropriate and Oriented  Insight:  Developing/Improving and Improving  Engagement in Therapy:  Improving  Modes of Intervention:  Discussion, Exploration, Problem-solving and Support  Description of Group:   In this group patients will be encouraged to explore what they see as obstacles to their own wellness and recovery. They will be guided to discuss their thoughts, feelings, and behaviors related to these obstacles. The group will process together ways to cope with barriers, with attention given to specific choices patients can make. Each patient will be challenged to identify changes they are motivated to make in order to overcome their obstacles. This group will be process-oriented, with patients participating in exploration of their own experiences as well as giving and receiving support and challenge from other group members.  Summary of Patient Progress: Pt monopolized group discussion, continues to minimize overdose prior to admission as she describes it as strictly attention seeking. Pt describes the situation as "it doesn't even make sense that I would have been trying to kill myself, it was just Excederin." Pt was observed to be laughing at inappropriate time and continued to emphasize that she did not need help.    Therapeutic Modalities:   Cognitive Behavioral Therapy Solution Focused Therapy Motivational Interviewing Relapse Prevention Therapy   Chad CordialLauren Carter, LCSWA 03/09/2015 3:47 PM

## 2015-03-09 NOTE — Progress Notes (Addendum)
Pt currently presents with a animated affect and anxious behavior. Pt speech is rapid and pressured. Per self inventory, pt rates depression, hopelessness and anxiety at a 0. Pt's daily goal is "talking to the doctor, going home so that I can go to work and see my loved ones, and keep positive thoughts" and they intend to do so by "stay positive, pray and be better than I was yesterday." Pt reports good sleep, a good appetite, normal energy and good concentration.   Pt provided with medications per providers orders. Pt's labs and vitals were monitored throughout the day. Pt supported emotionally and encouraged to express concerns and questions. Pt educated on medications and suicide precautions.   Pt's safety ensured with 15 minute and environmental checks. Pt states her discharge plan is to "talk to someone that isn't my family when I leave here, it was dumb what I did. I don't want to hurt myself." Pt reports she wishes to be discharged so "I don't lose my job, that would stress me out more." Pt currently denies SI/HI and A/V hallucinations. Pt verbally agrees to seek staff if SI/HI or A/VH occurs and to consult with staff before acting on these thoughts. Will continue POC.

## 2015-03-09 NOTE — [facility] Group Notes (Signed)
[facility]  03/09/2015 8:45 AM  Participation Quality: Alert, Appropriate and Oriented  Mood/Affect: Appropriate  Depression Rating: 0  Anxiety Rating: 0  Thoughts of Suicide: Pt denies SI/HI  Will you contract for safety? Yes  Current AVH: Pt denies  Plan for Discharge/Comments: Pt attended discharge planning group and actively participated in group. CSW discussed suicide prevention education with the group and encouraged them to discuss discharge planning and any relevant barriers. "I don't have any depression and anxiety, I just made a stupid decision." Pt shared with the group 4 significant deaths/traumas in her life but minimizes her grief and the decision to take Excedrin excessively. Pt expresses that she would like to DC today.  Transportation Means: Pt reports access to transportation  Supports: No supports mentioned at this time  Chad CordialLauren Carter, LCSWA 03/09/2015 9:39 AM

## 2015-03-09 NOTE — Progress Notes (Signed)
Patient ID: Davis GourdSyretta M Bartolo, female   DOB: Sep 20, 1980, 34 y.o.   MR[facility] [id number] [facility]  03/09/2015 3:07 PM Davis GourdSyretta M Vandeventer  MRN:  [id number] Subjective:  Patient reports she is feeling "OK", at this time minimizes any severe depression , states she is feeling better and her focus is to be discharged soon.  Denies medication side effects.  Objective :Case discussed with treatment team and patient seen . Patient denies any significant or severe  depression at this time, and is focused on being discharged soon. States her overdose was not suicidal in nature but simply " a misguided attempt to get attention".  Stresses being regretful about her overdose. At this time denies any severe or significant depression.  No disruptive or agitated behaviors on unit. She is visible in dayroom, going to groups.  Has reported low scores/ resolution of  anxiety/ depression.  Currently not endorsing any  medication side effects.  Principal Problem: Major depressive disorder, single episode, severe without psychosis (HCC) Diagnosis:   Patient Active Problem List   Diagnosis Date Noted  . Depression, major, single episode, severe (HCC) [F32.2] 03/06/2015  . Overdose of aspirin [T39.011A] 03/06/2015  . Major depressive disorder, single episode, severe without psychosis (HCC) [F32.2] 03/06/2015  . Suicide attempt by multiple drug overdose (HCC) [T50.902A]   . Risk for sexually transmitted disease [Z72.51] 12/27/2013  . Unspecified essential hypertension [I10] 12/27/2013  . Abdominal discomfort, generalized [R10.84] 12/27/2013  . Toothache [K08.89] 12/27/2013  . Tobacco dependence [F17.200] 12/27/2013  . Other malaise and fatigue [R53.81, R53.83] 12/27/2013  . Bilateral edema of lower extremity [R60.0] 12/27/2013  . Notalgia [M54.9] 12/27/2013  . Need for Tdap vaccination [Z23] 12/27/2013  . Vaginal pain [R10.2] 12/27/2013  . Depression [F32.9] 12/27/2013  . Exposure to STD [Z20.2] 11/04/2011  .  History of anemia [Z86.2] 11/04/2011  . Pregnancy, normal, incidental [Z33.1] 09/05/2011  . History of cesarean section [Z98.891] 09/05/2011  . History of RPR test [Z92.89] 09/05/2011  . Tobacco abuse [Z72.0] 09/05/2011  . Syphilis in pregnancy, antepartum [O98.119] 09/05/2011   Total Time spent with patient: 20 minutes  Past Psychiatric History:  She reports no hx of inpt or outpt MH treatment. She denies hx of suicide attempts and denies hx of self harm.   Past Medical History:  Past Medical History  Diagnosis Date  . Migraines     TYPICALLY TAKES EXCEDERIN MIGRAINE  . History of chlamydia 1998  . History of gonorrhea   . Hx of syphilis 1998  . H/O candidiasis   . Hx: UTI (urinary tract infection)   . Yeast infection   . Abnormal Pap smear     Medication was treatment  . Exposure to STD 11/04/2011  . History of anemia 11/04/2011  . Anemia     Hx -during Second Pregnancy    Past Surgical History  Procedure Laterality Date  . Cesarean section  2003, 2004, 2008,2013    x 4  . Cesarean section  03/23/2012    Procedure: CESAREAN SECTION;  Surgeon: Kirkland HunArthur Stringer, MD;  Location: [facility];  Service: Obstetrics;  Laterality: N/A;  Repeat   Family History:  Family History  Problem Relation Age of Onset  . Diabetes Father   . Hypertension Father   . Hypertension Sister   . Diabetes Sister     ADULT ONSET  . Hypertension Mother   . Heart disease Mother   . Diabetes Mother   . Thyroid disease Maternal Uncle   .  Heart disease Maternal Grandmother   . Hypertension Maternal Grandmother   . Diabetes Maternal Grandmother   . Diabetes Paternal Grandmother    Family Psychiatric  History: MDD in a few relatives Social History:  History  Alcohol Use No    Comment: "     History  Drug Use No    Comment: Prior to positive UPT - Last use August/September 2013  Denies 03/13/2014    Social History   Social History  . Marital Status: Single    Spouse Name: N/A  . Number of  Children: N/A  . Years of Education: 18   Social History Main Topics  . Smoking status: Former Smoker -- 1.00 packs/day for 15 years    Types: Cigarettes    Quit date: 01/03/2014  . Smokeless tobacco: Never Used     Comment: PT COUNSELED TO TRY DECREASING AMOUT SHE SMOKES A DAY UNTIL SHE CAN EVENTUALLY QUIT  . Alcohol Use: No     Comment: "  . Drug Use: No     Comment: Prior to positive UPT - Last use August/September 2013  Denies 03/13/2014  . Sexual Activity: Not Currently    Birth Control/ Protection: None     Comment: DEPO PROVERA   Other Topics Concern  . None   Social History Narrative   Additional Social History:   Pt sts she works at [employer]. Pt sts she lives w/ her brother and pt's young child. Pt denies substance abuse.   Pain Medications: n/a Name of Substance 1: n/a                  Sleep: Good  Appetite:  Good  Current Medications: Current Facility-Administered Medications  Medication Dose Route Frequency Provider Last Rate Last Dose  . acetaminophen (TYLENOL) tablet 650 mg  650 mg Oral Q6H PRN Charm Rings, NP   650 mg at 03/06/15 1834  . alum & mag hydroxide-simeth (MAALOX/MYLANTA) 200-200-20 MG/5ML suspension 30 mL  30 mL Oral Q4H PRN Charm Rings, NP      . hydrochlorothiazide (MICROZIDE) capsule 12.5 mg  12.5 mg Oral Daily Charm Rings, NP   12.5 mg at 03/09/15 0758  . ibuprofen (ADVIL,MOTRIN) tablet 600 mg  600 mg Oral Q8H PRN Charm Rings, NP      . LORazepam (ATIVAN) tablet 1 mg  1 mg Oral Q8H PRN Charm Rings, NP      . magnesium hydroxide (MILK OF MAGNESIA) suspension 30 mL  30 mL Oral Daily PRN Charm Rings, NP      . ondansetron [facility]) tablet 4 mg  4 mg Oral Q8H PRN Charm Rings, NP      . potassium chloride (K-DUR,KLOR-CON) CR tablet 10 mEq  10 mEq Oral Daily Charm Rings, NP   10 mEq at 03/09/15 0758  . traZODone (DESYREL) tablet 100 mg  100 mg Oral QHS Charm Rings, NP   100 mg at 03/06/15 2200    Lab  Results:  No results found for this or any previous visit (from the past 48 hour(s)).  Physical Findings: AIMS: Facial and Oral Movements Muscles of Facial Expression: None, normal Lips and Perioral Area: None, normal Jaw: None, normal Tongue: None, normal,Extremity Movements Upper (arms, wrists, hands, fingers): None, normal Lower (legs, knees, ankles, toes): None, normal, Trunk Movements Neck, shoulders, hips: None, normal, Overall Severity Severity of abnormal movements (highest score from questions above): None, normal Incapacitation due to abnormal movements:  None, normal Patient's awareness of abnormal movements (rate only patient's report): No Awareness, Dental Status Current problems with teeth and/or dentures?: No Does patient usually wear dentures?: No  CIWA:  CIWA-Ar Total: 1 COWS:  COWS Total Score: 0  Musculoskeletal: Strength & Muscle Tone: within normal limits Gait & Station: normal Patient leans: N/A  Psychiatric Specialty Exam: Review of Systems  Constitutional: Negative for fever and chills.  Respiratory: Negative for cough and shortness of breath.   Cardiovascular: Negative for chest pain.  Gastrointestinal: Negative for nausea, vomiting, abdominal pain and constipation.  Musculoskeletal: Negative for back pain, joint pain and neck pain.  Skin: Negative for itching and rash.  Neurological: Positive for headaches.  Psychiatric/Behavioral: Negative.     Blood pressure 145/119, pulse 95, temperature 98.4 F (36.9 C), temperature source Oral, resp. rate 16, height  (1.6 m), weight 153 lb (69.4 kg), last menstrual period 03/05/2015, SpO2 14 %.Body mass index is 27.11 kg/(m^2).  General Appearance: Casual  Eye Contact::  Good  Speech:  Clear and Coherent and Normal Rate  Volume:  Normal  Mood:   Denies feeling depressed .   Affect:   Appropriate, mildly anxious, not constricted or sad   Thought Process:   Linear   Orientation:  Full (Time, Place, and  Person)  Thought Content:   Denies hallucinations, no delusions expressed   Suicidal Thoughts:  No at this time denies any thoughts of hurting self or of SI.  Homicidal Thoughts:  No  Memory:  Immediate;   Good Recent;   Good Remote;   Good  Judgement:  Improving   Insight:   Improving   Psychomotor Activity:  Normal- no agitation or restlessness   Concentration:  Good  Recall:  Good  Fund of Knowledge:Good  Language: Good  Akathisia:  No  Handed:  Right  AIMS (if indicated):     Assets:  Communication Skills Desire for Improvement Housing Social Support Talents/Skills Transportation  ADL's:  Intact  Cognition: WNL  Sleep:  Number of Hours: 6.75  Assessment - patient currently  minimizes depression, and denies any SI. States her overdose was impulsive and feels she was just trying to get attention rather than to die. At this time does not endorse significant sadness, anhedonia or any severe neuro-vegetative symptoms of depression.  Of note, not currently on any standing psychiatric medication- patient has stated not interested - does want psychotherapy.  Treatment Plan Summary: Daily contact with patient to assess and evaluate symptoms and progress in treatment  Encourage ongoing group, milieu participation to work on symptom reduction, coping skills  Of note, patient has not been interested in starting any psychiatric medications. She is expressing interest in psychotherapy to manage symptoms. Continue Trazodone 100 mgrs QHS PRN for insomnia as needed  Continue Vistaril PRNs for anxiety as needed  Treatment team working on disposition planning options   COBOS, FERNANDO 03/09/2015, 3:07 PM

## 2015-03-10 MED ORDER — TRAZODONE HCL 100 MG PO TABS: 100.0000 mg | ORAL_TABLET | Freq: Every day | ORAL | Status: DC

## 2015-03-10 MED ORDER — HYDROCHLOROTHIAZIDE 12.5 MG PO CAPS: 12.5000 mg | ORAL_CAPSULE | Freq: Every day | ORAL | Status: DC

## 2015-03-10 NOTE — Tx Team (Signed)
Interdisciplinary Treatment Plan Update (Adult) Date: 03/10/2015   Date: 03/10/2015 1:09 PM  Progress in Treatment:  Attending groups: Yes  Participating in groups: Yes  Taking medication as prescribed: Yes  Tolerating medication: Yes  Family/Significant othe contact made: Yes, with sister Patient understands diagnosis: Yes Discussing patient identified problems/goals with staff: Yes  Medical problems stabilized or resolved: Yes  Denies suicidal/homicidal ideation: Yes Patient has not harmed self or Others: Yes   New problem(s) identified: None identified at this time.   Discharge Plan or Barriers: Pt will return home and [facility]  Additional comments: n/a   Reason for Continuation of Hospitalization:  Depression Medication stabilization Suicidal ideation  Estimated length of stay: 0 days; Pt stable for DC  Review of initial/current patient goals per problem list:   1.  Goal(s): Patient will participate in aftercare plan  Met:  Yes  Target date: 3-5 days from date of admission   As evidenced by: Patient will participate within aftercare plan AEB aftercare provider and housing plan at discharge being identified.   03/10/15: Pt will return home and follow-up with [facility]  2.  Goal (s): Patient will exhibit decreased depressive symptoms and suicidal ideations.  Met:  Yes  Target date: 3-5 days from date of admission   As evidenced by: Patient will utilize self rating of depression at 3 or below and demonstrate decreased signs of depression or be deemed stable for discharge by MD.  03/10/15: Pt rates depression at 0/10; denies SI  Attendees:  Patient:    Family:    Physician: Dr. Parke Poisson, MD  03/10/2015 1:09 PM  Nursing: Lars Pinks, RN Case manager  03/10/2015 1:09 PM  Clinical Social Worker Norman Clay, MSW 03/10/2015 1:09 PM  Other: Lucinda Dell, Beverly Sessions Liasion 03/10/2015 1:09 PM  Clinical:  Loletta Specter, RN 03/10/2015 1:09 PM  Other: , RN Charge Nurse 03/10/2015 1:09 PM  Other:     Peri Maris, Latanya Presser MSW

## 2015-03-10 NOTE — Discharge Summary (Signed)
Physician Discharge Summary Note  Patient:  Anita Fuentes is an 34 y.o., female MRN:  [id number] DOB:  [birth year] Patient phone:  [phone] (home)  Patient address:   [address] [place] [address],  Total Time spent with patient: 30 minutes  Date of Admission:  03/06/2015 Date of Discharge: 03/10/2015  Reason for Admission: PER H&PSyretta Judie Petit Fuentes is an 34 y.o. female. Pt presents voluntarily to [facility]. Pt reports she ingested 19 excedrin pills and some alcohol. Pt sts she was "having a bad day". Pt denies that she was trying to commit suicide. She says that she was upset b/c her 75 mo old child died 15 mos ago and this would be the child's birthday month. Pt reports euthymic mood and her affect is mood congruent. Pt sts she works at [employer]. Pt sts she lives w/ her brother and pt's young child. Pt denies substance abuse. She reports no hx of inpt or outpt MH treatment. She denies hx of suicide attempts and denies hx of self harm. Pt states she is ready to be d/c. Writer talks w/ pt about the possible consequences of overdose including death. Pt appears to minimize her Excedrin ingestion this am.  Pt's sister is present at bedside. Sister reports pt texted her this am to say she had ingested several excedrin. Sister says she called EMS and they picked up pt.  Writer ran pt by Nanine Means DNP who recommends inpatient treatment.   Principal Problem: Major depressive disorder, single episode, severe without psychosis [facility]) Discharge Diagnoses: Patient Active Problem List   Diagnosis Date Noted  . Depression, major, single episode, severe (HCC) [F32.2] 03/06/2015  . Overdose of aspirin [T39.011A] 03/06/2015  . Major depressive disorder, single episode, severe without psychosis (HCC) [F32.2] 03/06/2015  . Suicide attempt by multiple drug overdose (HCC) [T50.902A]   . Risk for sexually transmitted disease [Z72.51] 12/27/2013  . Unspecified essential  hypertension [I10] 12/27/2013  . Abdominal discomfort, generalized [R10.84] 12/27/2013  . Toothache [K08.89] 12/27/2013  . Tobacco dependence [F17.200] 12/27/2013  . Other malaise and fatigue [R53.81, R53.83] 12/27/2013  . Bilateral edema of lower extremity [R60.0] 12/27/2013  . Notalgia [M54.9] 12/27/2013  . Need for Tdap vaccination [Z23] 12/27/2013  . Vaginal pain [R10.2] 12/27/2013  . Depression [F32.9] 12/27/2013  . Exposure to STD [Z20.2] 11/04/2011  . History of anemia [Z86.2] 11/04/2011  . Pregnancy, normal, incidental [Z33.1] 09/05/2011  . History of cesarean section [Z98.891] 09/05/2011  . History of RPR test [Z92.89] 09/05/2011  . Tobacco abuse [Z72.0] 09/05/2011  . Syphilis in pregnancy, antepartum [O98.119] 09/05/2011    Musculoskeletal: Strength & Muscle Tone: within normal limits Gait & Station: normal Patient leans: N/A  Psychiatric Specialty Exam: SEE SRA BY MD Physical Exam  Nursing note and vitals reviewed. Constitutional: She is oriented to person, place, and time. She appears well-developed.  HENT:  Head: Normocephalic.  Neck: Normal range of motion. Neck supple.  Musculoskeletal: Normal range of motion.  Neurological: She is alert and oriented to person, place, and time.  Psychiatric: She has a normal mood and affect. Her behavior is normal. Judgment and thought content normal.    Review of Systems  Psychiatric/Behavioral: Negative for suicidal ideas. Depression: stable. Nervous/anxious: stable.   All other systems reviewed and are negative.   Blood pressure 128/94, pulse 77, temperature 98.4 F (36.9 C), temperature source Oral, resp. rate 16, height  (1.6 m), weight 69.4 kg (153 lb), last menstrual period 03/05/2015, SpO2 14 %.  Body mass index is 27.11 kg/(m^2).  Have you used any form of tobacco in the last 30 days? (Cigarettes, Smokeless Tobacco, Cigars, and/or Pipes): No  Has this patient used any form of tobacco in the last 30 days?  (Cigarettes, Smokeless Tobacco, Cigars, and/or Pipes) No  Past Medical History:  Past Medical History  Diagnosis Date  . Migraines     TYPICALLY TAKES EXCEDERIN MIGRAINE  . History of chlamydia 1998  . History of gonorrhea   . Hx of syphilis 1998  . H/O candidiasis   . Hx: UTI (urinary tract infection)   . Yeast infection   . Abnormal Pap smear     Medication was treatment  . Exposure to STD 11/04/2011  . History of anemia 11/04/2011  . Anemia     Hx -during Second Pregnancy    Past Surgical History  Procedure Laterality Date  . Cesarean section  2003, 2004, 2008,2013    x 4  . Cesarean section  03/23/2012    Procedure: CESAREAN SECTION;  Surgeon: Kirkland Hun, MD;  Location: [facility];  Service: Obstetrics;  Laterality: N/A;  Repeat   Family History:  Family History  Problem Relation Age of Onset  . Diabetes Father   . Hypertension Father   . Hypertension Sister   . Diabetes Sister     ADULT ONSET  . Hypertension Mother   . Heart disease Mother   . Diabetes Mother   . Thyroid disease Maternal Uncle   . Heart disease Maternal Grandmother   . Hypertension Maternal Grandmother   . Diabetes Maternal Grandmother   . Diabetes Paternal Grandmother    Social History:  History  Alcohol Use No    Comment: "     History  Drug Use No    Comment: Prior to positive UPT - Last use August/September 2013  Denies 03/13/2014    Social History   Social History  . Marital Status: Single    Spouse Name: N/A  . Number of Children: N/A  . Years of Education: 3   Social History Main Topics  . Smoking status: Former Smoker -- 1.00 packs/day for 15 years    Types: Cigarettes    Quit date: 01/03/2014  . Smokeless tobacco: Never Used     Comment: PT COUNSELED TO TRY DECREASING AMOUT SHE SMOKES A DAY UNTIL SHE CAN EVENTUALLY QUIT  . Alcohol Use: No     Comment: "  . Drug Use: No     Comment: Prior to positive UPT - Last use August/September 2013  Denies 03/13/2014  . Sexual  Activity: Not Currently    Birth Control/ Protection: None     Comment: DEPO PROVERA   Other Topics Concern  . None   Social History Narrative    Risk to Self: Is patient at risk for suicide?: Yes What has been your use of drugs/alcohol within the last 12 months?: Occasional alcohol, occasional marijuana Risk to Others:  no Prior Inpatient Therapy:  no Prior Outpatient Therapy:  yes  Level of Care:  OP  Hospital Course:  BRANDE UNCAPHER was admitted for Major depressive disorder, single episode, severe without psychosis (HCC) and crisis management.She was treated discharged with the medications listed below under Medication List.  Medical problems were identified and treated as needed.  Home medications were restarted as appropriate.  Improvement was monitored by observation and Davis Gourd daily report of symptom reduction.  Emotional and mental status was monitored by daily self-inventory reports completed by  Anaelle M Skousen and clinical staff.         Davis GourdSyretDavis Gourdta M Mally was evaluated by the treatment team for stability and plans for continued recovery upon discharge.  Joline MaxcySyretta Colen DarlingM Osburn motivation was an integral factor for scheduling further treatment.  Employment, transportation, bed availability, health status, family support, and any pending legal issues were also considered during her hospital stay. She was offered further treatment options upon discharge including but not limited to Residential, Intensive Outpatient, and Outpatient treatment.  Davis GourdSyretta M Moates will follow up with the services as listed below under Follow Up Information.     Upon completion of this admission the patient was both mentally and medically stable for discharge denying suicidal/homicidal ideation, auditory/visual/tactile hallucinations, delusional thoughts and paranoia.      Consults:  psychiatry  Significant Diagnostic Studies:  labs: UDS-, Actaminoophen level<10, BMP; Glucose 102, Potassium 3.1, total  Bilirubin   Discharge Vitals:   Blood pressure 128/94, pulse 77, temperature 98.4 F (36.9 C), temperature source Oral, resp. rate 16, height 5\' 3"  (1.6 m), weight 69.4 kg (153 lb), last menstrual period 03/05/2015, SpO2 14 %. Body mass index is 27.11 kg/(m^2). Lab Results:   No results found for this or any previous visit (from the past 72 hour(s)).  Physical Findings: AIMS: Facial and Oral Movements Muscles of Facial Expression: None, normal Lips and Perioral Area: None, normal Jaw: None, normal Tongue: None, normal,Extremity Movements Upper (arms, wrists, hands, fingers): None, normal Lower (legs, knees, ankles, toes): None, normal, Trunk Movements Neck, shoulders, hips: None, normal, Overall Severity Severity of abnormal movements (highest score from questions above): None, normal Incapacitation due to abnormal movements: None, normal Patient's awareness of abnormal movements (rate only patient's report): No Awareness, Dental Status Current problems with teeth and/or dentures?: No Does patient usually wear dentures?: No  CIWA:  CIWA-Ar Total: 1 COWS:  COWS Total Score: 0   See Psychiatric Specialty Exam and Suicide Risk Assessment completed by Attending Physician prior to discharge.  Discharge destination:  Home  Is patient on multiple antipsychotic therapies at discharge:  No   Has Patient had three or more failed trials of antipsychotic monotherapy by history:  No    Recommended Plan for Multiple Antipsychotic Therapies: NA      Discharge Instructions    Activity as tolerated - No restrictions    Complete by:  As directed      Diet general    Complete by:  As directed      Discharge instructions    Complete by:  As directed   Patient has been instructed to take medications as prescribed; and report adverse effects to outpatient provider.  Follow up with primary doctor for any medical issues and If symptoms recur report to nearest emergency or crisis hot line.             Medication List    STOP taking these medications        diclofenac 75 MG EC tablet  Commonly known as:  VOLTAREN     hydrochlorothiazide 12.5 MG tablet  Commonly known as:  HYDRODIURIL  Replaced by:  hydrochlorothiazide 12.5 MG capsule      TAKE these medications      Indication   hydrochlorothiazide 12.5 MG capsule  Commonly known as:  MICROZIDE  Take 1 capsule (12.5 mg total) by mouth daily.   Indication:  High Blood Pressure     traZODone 100 MG tablet  Commonly known as:  DESYREL  Take 1 tablet (  100 mg total) by mouth at bedtime.   Indication:  Aggressive Behavior, Excessive Use of Alcohol, Anxiety Disorder, Trouble Sleeping, Major Depressive Disorder       Follow-up Information    Call [facility].   Why:  For a grief counseling appointment   Contact information:   [address] [place]  [address] Telephone:  [phone]      Follow up with [facility] On 03/12/2015.   Why:  at 11:00am for therapy with Rex Kras information:   [facility] [address]  [address]  [place], [place] [address] PHONE: [phone] FAX: [phone]      Follow-up recommendations:  Activity:  as tolerated Diet:  heart healthy  Comments: Take all of you medications as prescribed by your mental healthcare provider.  Report any adverse effects and reactions from your medications to your outpatient provider promptly. Do not engage in alcohol and or illegal drug use while on prescription medicines. In the event of worsening symptoms call the crisis hotline, 911, and or go to the nearest emergency department for appropriate evaluation and treatment of symptoms. Follow-up with your primary care provider for your medical issues, concerns and or health care needs.   Keep all scheduled appointments.  If you are unable to keep an appointment call to reschedule.  Let the nurse know if you  will need medications before next scheduled appointment.  Total Discharge Time: Greater than 30 minutes  Signed: Oneta Rack FNP-BC 03/10/2015, 3:23 PM   Patient seen, Suicide Assessment Completed.  Disposition Plan Reviewed

## 2015-03-10 NOTE — Progress Notes (Signed)
D:  Patient was calm and cooperative at the time of discharge.  Patient expressed readiness for discharge home.  A:  Discharge paperwork was reviewed with the patient and she expressed understanding.  Medications were reviewed with patient and prescriptions were given along with sample medications.  Patient's belongings were returned upon her leaving the unit.  Patient was provided a patient satisfaction survey before leaving the unit.  Patient was escorted to the lobby. R:  Patient expressed understanding of her follow up plans and discharge medications.  Patient denied suicidal ideation, homicidal ideation, auditory or visual hallucinations at the current time.  Patient denied any pain remained safe at the time of discharge.

## 2015-03-10 NOTE — Plan of Care (Signed)
Problem: Alteration in mood Goal: LTG-Pt's behavior demonstrates decreased signs of depression (Patient's behavior demonstrates decreased signs of depression to the point the patient is safe to return home and continue treatment in an outpatient setting)  Outcome: Progressing Patient reports her mood is improved "great" today and reports readiness for discharge.

## 2015-03-10 NOTE — BHH Suicide Risk Assessment (Signed)
[facility] Dis[facility]icide Risk [facility]   Demographic Factors:  34 year old female , single, lives with  A brother and children, employed    Total Time spent with patient: 30 minutes  Musculoskeletal: Strength & Muscle Tone: within normal limits Gait & Station: normal Patient leans: N/A  Psychiatric Specialty Exam: Physical Exam  ROS  Blood pressure 128/94, pulse 77, temperature 98.4 F (36.9 C), temperature source Oral, resp. rate 16, height 5\' 3"  (1.6 m), weight 153 lb (69.4 kg), last menstrual period 03/05/2015, SpO2 14 %.Body mass index is 27.11 kg/(m^2).  General Appearance: Well Groomed  Patent attorneyye Contact::  Good  Speech:  Normal Rate409  Volume:  Normal  Mood:  Euthymic  Affect:  Appropriate and Full Range  Thought Process:  Goal Directed and Linear  Orientation:  Full (Time, Place, and Person)  Thought Content:  no hallucinations, no delusions , not internally preoccupied   Suicidal Thoughts:  No denies any SI or self injurious thoughts   Homicidal Thoughts:  No  Memory:  recent and remote grossly intact  Judgement:  Other:  improved  Insight:  improved   Psychomotor Activity:  Normal  Concentration:  Good  Recall:  Good  Fund of Knowledge:Good  Language: Good  Akathisia:  Negative  Handed:  Right  AIMS (if indicated):     Assets:  Communication Skills Desire for Improvement Resilience  Sleep:  Number of Hours: 6.25  Cognition: WNL  ADL's:  Intact   Have you used any form of tobacco in the last 30 days? (Cigarettes, Smokeless Tobacco, Cigars, and/or Pipes): No  Has this patient used any form of tobacco in the last 30 days? (Cigarettes, Smokeless Tobacco, Cigars, and/or Pipes) No  Mental Status Per Nursing Assessment::   On Admission:     Current Mental Status by Physician: At this time patient is much improved compared to admission status- mood is improved and today seems euthymic, with a full range of affect, no thought disorder, no SI or HI, no psychotic  symptoms and is future oriented, focused on returning to work soon.  Loss Factors:  history of one of her children drowning / bereavement   Historical Factors: Denies prior psychiatric history   Risk Reduction Factors:   Responsible for children under 34 years of age, Sense of responsibility to family, Employed and Positive coping skills or problem solving skills  Continued Clinical Symptoms:  As noted, currently significantly improved compared to admission  Cognitive Features That Contribute To Risk:  No gross cognitive deficits noted upon discharge. Is alert , attentive, and oriented x 3   Suicide Risk:  Mild:  Suicidal ideation of limited frequency, intensity, duration, and specificity.  There are no identifiable plans, no associated intent, mild dysphoria and related symptoms, good self-control (both objective and subjective assessment), few other risk factors, and identifiable protective factors, including available and accessible social support.  Principal Problem: Major depressive disorder, single episode, severe without psychosis [facility](HCC) Discharge Diagnoses:  Patient Active Problem List   Diagnosis Date Noted  . Depression, major, single episode, severe (HCC) [F32.2] 03/06/2015  . Overdose of aspirin [T39.011A] 03/06/2015  . Major depressive disorder, single episode, severe without psychosis (HCC) [F32.2] 03/06/2015  . Suicide attempt by multiple drug overdose (HCC) [T50.902A]   . Risk for sexually transmitted disease [Z72.51] 12/27/2013  . Unspecified essential hypertension [I10] 12/27/2013  . Abdominal discomfort, generalized [R10.84] 12/27/2013  . Toothache [K08.89] 12/27/2013  . Tobacco dependence [F17.200] 12/27/2013  . Other malaise and fatigue [R53.81,  R53.83] 12/27/2013  . Bilateral edema of lower extremity [R60.0] 12/27/2013  . Notalgia [M54.9] 12/27/2013  . Need for Tdap vaccination [Z23] 12/27/2013  . Vaginal pain [R10.2] 12/27/2013  . Depression [F32.9]  12/27/2013  . Exposure to STD [Z20.2] 11/04/2011  . History of anemia [Z86.2] 11/04/2011  . Pregnancy, normal, incidental [Z33.1] 09/05/2011  . History of cesarean section [Z98.891] 09/05/2011  . History of RPR test [Z92.89] 09/05/2011  . Tobacco abuse [Z72.0] 09/05/2011  . Syphilis in pregnancy, antepartum [O98.119] 09/05/2011    Follow-up Information    Call [facility].   Why:  For a grief counseling appointment   Contact information:   [address] [place]  [address] Telephone:  [phone]      Follow up with [facility] On 03/12/2015.   Why:  at 11:00am for therapy with Rex Kras information:   [facility] [address]  [address]  [place], [place] [address] PHONE: [phone] FAX: [phone]      Plan Of Care/Follow-up recommendations:  Activity:  as tolerated  Diet:  Regular Tests:  NA Other:  See below  Is patient on multiple antipsychotic therapies at discharge:  No   Has Patient had three or more failed trials of antipsychotic monotherapy by history:  No  Recommended Plan for Multiple Antipsychotic Therapies: NA  Patient is leaving unit in good spirits Plans to return home . Follow up as above.   COBOS, FERNANDO 03/10/2015, 11:44 AM

## 2015-03-10 NOTE — BHH Group Notes (Signed)
BHH Group Notes:  (Nursing/MHT/Case Management/Adjunct)  Date:  03/10/2015  Time:  9:27 AM  Type of Therapy:  Nurse Education  SMART goal setting  Participation Level:  Active  Participation Quality:  Appropriate  Affect:  Appropriate  Cognitive:  Appropriate  Insight:  Good  Engagement in Group:  Engaged  Modes of Intervention:  Discussion and Exploration  Summary of Progress/Problems:  Patient participated actively in group today stating that her goal for today was "to go home."  Patient states she would like to gain 10 pounds in the near future.  Patient acknowledges that her overdose was a "stupid mistake" and that she has no desire to end her life currently.    Anita SalisburyJennifer L Jonahtan Fuentes 03/10/2015, 9:27 AM

## 2015-03-10 NOTE — Plan of Care (Signed)
Problem: Alteration in mood Goal: LTG-Patient reports reduction in suicidal thoughts (Patient reports reduction in suicidal thoughts and is able to verbalize a safety plan for whenever patient is feeling suicidal)  Outcome: Progressing Patient denies suicidal ideation currently and contracts for safety on the unit.

## 2015-03-10 NOTE — Progress Notes (Signed)
  [facility] :  Will you be returning to the same living situation after discharge:  Yes,  Pt returning to her home At discharge, do you have transportation home?: Yes,  Pt's car is at the hospital; will provide her own transportation Do you have the ability to pay for your medications: Yes,  Pt provided with prescriptions  Release of information consent forms completed and in the chart;  Patient's signature needed at discharge.  Patient to Follow up at: Follow-up Information    [facility] [place].   Why:  For a grief counseling appointment   Contact information:   [address] [place]  [address] Telephone:  [phone]      Follow up with [facility] On 03/12/2015.   Why:  at 11:00am for therapy with Rex KrasShannon   Contact information:   [facility] [address]  [address]  [place], [place] [address] PHONE: [phone] FAX: [phone]      Next level of care provider has [facility] to [facility] Link:no  Patient denies SI/HI: Yes,  Pt denies    Safety Planning and Suicide Prevention discussed: Yes,  with sister; see SPE note for further details  Have you used any form of tobacco in the last 30 days? (Cigarettes, Smokeless Tobacco, Cigars, and/or Pipes): No  Has patient been referred to the Quitline?: N/A patient is not a smoker  Elaina HoopsCarter, Sora Vrooman M 03/10/2015, 1:12 PM

## 2016-01-27 ENCOUNTER — Encounter (HOSPITAL_COMMUNITY)
Admission: AD | Disposition: A | Discharge status: Home / Self Care | Payer: Self-pay | Source: Ambulatory Visit | Attending: Family Medicine

## 2016-01-27 ENCOUNTER — Inpatient Hospital Stay (HOSPITAL_COMMUNITY): Payer: Medicaid Other | Admitting: Anesthesiology

## 2016-01-27 ENCOUNTER — Inpatient Hospital Stay (HOSPITAL_COMMUNITY): Payer: Medicaid Other

## 2016-01-27 ENCOUNTER — Ambulatory Visit (HOSPITAL_COMMUNITY)
Admission: AD | Admit: 2016-01-27 | Discharge: 2016-01-28 | Disposition: A | Discharge status: Home / Self Care | Payer: Medicaid Other | Source: Ambulatory Visit | Attending: Obstetrics and Gynecology | Admitting: Obstetrics and Gynecology

## 2016-01-27 ENCOUNTER — Encounter (HOSPITAL_COMMUNITY): Payer: Self-pay

## 2016-01-27 DIAGNOSIS — O001 Tubal pregnancy without intrauterine pregnancy: Secondary | ICD-10-CM | POA: Insufficient documentation

## 2016-01-27 DIAGNOSIS — O26899 Other specified pregnancy related conditions, unspecified trimester: Secondary | ICD-10-CM

## 2016-01-27 DIAGNOSIS — Z87891 Personal history of nicotine dependence: Secondary | ICD-10-CM | POA: Insufficient documentation

## 2016-01-27 DIAGNOSIS — I10 Essential (primary) hypertension: Secondary | ICD-10-CM | POA: Insufficient documentation

## 2016-01-27 DIAGNOSIS — R109 Unspecified abdominal pain: Secondary | ICD-10-CM

## 2016-01-27 DIAGNOSIS — O009 Unspecified ectopic pregnancy without intrauterine pregnancy: Secondary | ICD-10-CM

## 2016-01-27 DIAGNOSIS — O209 Hemorrhage in early pregnancy, unspecified: Secondary | ICD-10-CM

## 2016-01-27 HISTORY — DX: Essential (primary) hypertension: I10

## 2016-01-27 HISTORY — PX: DIAGNOSTIC LAPAROSCOPY WITH REMOVAL OF ECTOPIC PREGNANCY: SHX6449

## 2016-01-27 LAB — WET PREP, GENITAL
SPERM: NONE SEEN
TRICH WET PREP: NONE SEEN
Yeast Wet Prep HPF POC: NONE SEEN

## 2016-01-27 LAB — URINALYSIS, ROUTINE W REFLEX MICROSCOPIC
BILIRUBIN URINE: NEGATIVE
GLUCOSE, UA: NEGATIVE mg/dL
HGB URINE DIPSTICK: NEGATIVE
Ketones, ur: NEGATIVE mg/dL
Leukocytes, UA: NEGATIVE
Nitrite: NEGATIVE
PH: 6 (ref 5.0–8.0)
Protein, ur: NEGATIVE mg/dL

## 2016-01-27 LAB — CBC
HEMATOCRIT: 35.1 % — AB (ref 36.0–46.0)
Hemoglobin: 12.6 g/dL (ref 12.0–15.0)
MCH: 34.3 pg — AB (ref 26.0–34.0)
MCHC: 35.9 g/dL (ref 30.0–36.0)
MCV: 95.6 fL (ref 78.0–100.0)
Platelets: 196 10*3/uL (ref 150–400)
RBC: 3.67 MIL/uL — ABNORMAL LOW (ref 3.87–5.11)
RDW: 12.6 % (ref 11.5–15.5)
WBC: 8.6 10*3/uL (ref 4.0–10.5)

## 2016-01-27 LAB — POCT PREGNANCY, URINE: Preg Test, Ur: POSITIVE — AB

## 2016-01-27 LAB — HCG, QUANTITATIVE, PREGNANCY: hCG, Beta Chain, Quant, S: 8651 m[IU]/mL — ABNORMAL HIGH (ref <5)

## 2016-01-27 SURGERY — LAPAROSCOPY, WITH ECTOPIC PREGNANCY SURGICAL TREATMENT
Anesthesia: General | Site: Abdomen | Laterality: Left

## 2016-01-27 MED ORDER — OXYCODONE-ACETAMINOPHEN 5-325 MG PO TABS
2.0000 | ORAL_TABLET | Freq: Once | ORAL | Status: AC
  Administered 2016-01-27: 2 via ORAL

## 2016-01-27 MED ORDER — LIDOCAINE-EPINEPHRINE (PF) 1 %-1:200000 IJ SOLN
INTRAMUSCULAR | Status: DC | PRN
  Administered 2016-01-27: 10 mL

## 2016-01-27 MED ORDER — MIDAZOLAM HCL 5 MG/5ML IJ SOLN
INTRAMUSCULAR | Status: DC | PRN
  Administered 2016-01-27: 2 mg via INTRAVENOUS

## 2016-01-27 MED ORDER — GLYCOPYRROLATE 0.2 MG/ML IJ SOLN
INTRAMUSCULAR | Status: AC
  Filled 2016-01-27: qty 1

## 2016-01-27 MED ORDER — ONDANSETRON HCL 4 MG/2ML IJ SOLN
INTRAMUSCULAR | Status: AC
  Filled 2016-01-27: qty 2

## 2016-01-27 MED ORDER — LACTATED RINGERS IV BOLUS (SEPSIS)
1000.0000 mL | Freq: Once | INTRAVENOUS | Status: DC
Start: 2016-01-27 — End: 2016-01-28

## 2016-01-27 MED ORDER — HYDROMORPHONE HCL 1 MG/ML IJ SOLN
INTRAMUSCULAR | Status: AC
  Filled 2016-01-27: qty 1

## 2016-01-27 MED ORDER — ONDANSETRON HCL 4 MG/2ML IJ SOLN
INTRAMUSCULAR | Status: DC | PRN
  Administered 2016-01-27: 4 mg via INTRAVENOUS

## 2016-01-27 MED ORDER — OXYCODONE-ACETAMINOPHEN 7.5-325 MG PO TABS: 1.0000 | ORAL_TABLET | ORAL | 0 refills | Status: DC | PRN

## 2016-01-27 MED ORDER — SUCCINYLCHOLINE CHLORIDE 200 MG/10ML IV SOSY
PREFILLED_SYRINGE | INTRAVENOUS | Status: AC
  Filled 2016-01-27: qty 10

## 2016-01-27 MED ORDER — MIDAZOLAM HCL 2 MG/2ML IJ SOLN
INTRAMUSCULAR | Status: AC
  Filled 2016-01-27: qty 2

## 2016-01-27 MED ORDER — SUGAMMADEX SODIUM 200 MG/2ML IV SOLN
INTRAVENOUS | Status: AC
  Filled 2016-01-27: qty 2

## 2016-01-27 MED ORDER — ROCURONIUM BROMIDE 100 MG/10ML IV SOLN
INTRAVENOUS | Status: DC | PRN
  Administered 2016-01-27: 10 mg via INTRAVENOUS

## 2016-01-27 MED ORDER — DEXAMETHASONE SODIUM PHOSPHATE 10 MG/ML IJ SOLN
INTRAMUSCULAR | Status: AC
  Filled 2016-01-27: qty 1

## 2016-01-27 MED ORDER — DEXAMETHASONE SODIUM PHOSPHATE 4 MG/ML IJ SOLN
INTRAMUSCULAR | Status: DC | PRN
  Administered 2016-01-27: 10 mg via INTRAVENOUS

## 2016-01-27 MED ORDER — PROPOFOL 500 MG/50ML IV EMUL
INTRAVENOUS | Status: AC
  Filled 2016-01-27: qty 50

## 2016-01-27 MED ORDER — FENTANYL CITRATE (PF) 250 MCG/5ML IJ SOLN
INTRAMUSCULAR | Status: AC
  Filled 2016-01-27: qty 5

## 2016-01-27 MED ORDER — HYDROMORPHONE HCL 1 MG/ML IJ SOLN
0.2500 mg | INTRAMUSCULAR | Status: DC | PRN
  Administered 2016-01-27: 0.5 mg via INTRAVENOUS

## 2016-01-27 MED ORDER — SOD CITRATE-CITRIC ACID 500-334 MG/5ML PO SOLN
30.0000 mL | Freq: Once | ORAL | Status: AC
  Administered 2016-01-27: 30 mL via ORAL
  Filled 2016-01-27: qty 15

## 2016-01-27 MED ORDER — OXYCODONE-ACETAMINOPHEN 5-325 MG PO TABS
ORAL_TABLET | ORAL | Status: AC
  Administered 2016-01-27: 2 via ORAL
  Filled 2016-01-27: qty 2

## 2016-01-27 MED ORDER — HYDROMORPHONE HCL 1 MG/ML IJ SOLN
INTRAMUSCULAR | Status: DC | PRN
  Administered 2016-01-27: 1 mg via INTRAVENOUS

## 2016-01-27 MED ORDER — LIDOCAINE 2% (20 MG/ML) 5 ML SYRINGE
INTRAMUSCULAR | Status: DC | PRN
  Administered 2016-01-27: 50 mg via INTRAVENOUS

## 2016-01-27 MED ORDER — LACTATED RINGERS IV SOLN
INTRAVENOUS | Status: DC
  Administered 2016-01-27 (×2): via INTRAVENOUS

## 2016-01-27 MED ORDER — HYDROCODONE-ACETAMINOPHEN 5-325 MG PO TABS: 1.0000 | ORAL_TABLET | Freq: Four times a day (QID) | ORAL | 0 refills | Status: DC | PRN

## 2016-01-27 MED ORDER — FENTANYL CITRATE (PF) 100 MCG/2ML IJ SOLN
INTRAMUSCULAR | Status: DC | PRN
  Administered 2016-01-27 (×2): 100 ug via INTRAVENOUS
  Administered 2016-01-27: 50 ug via INTRAVENOUS

## 2016-01-27 MED ORDER — SUGAMMADEX SODIUM 200 MG/2ML IV SOLN
INTRAVENOUS | Status: DC | PRN
  Administered 2016-01-27: 200 mg via INTRAVENOUS

## 2016-01-27 MED ORDER — FAMOTIDINE IN NACL 20-0.9 MG/50ML-% IV SOLN
20.0000 mg | Freq: Once | INTRAVENOUS | Status: AC
  Administered 2016-01-27: 20 mg via INTRAVENOUS
  Filled 2016-01-27: qty 50

## 2016-01-27 MED ORDER — ROCURONIUM BROMIDE 100 MG/10ML IV SOLN
INTRAVENOUS | Status: AC
  Filled 2016-01-27: qty 1

## 2016-01-27 MED ORDER — LIDOCAINE HCL (CARDIAC) 20 MG/ML IV SOLN
INTRAVENOUS | Status: AC
  Filled 2016-01-27: qty 5

## 2016-01-27 MED ORDER — PROPOFOL 10 MG/ML IV BOLUS
INTRAVENOUS | Status: DC | PRN
  Administered 2016-01-27: 140 mg via INTRAVENOUS
  Administered 2016-01-27: 60 mg via INTRAVENOUS

## 2016-01-27 MED ORDER — KETOROLAC TROMETHAMINE 30 MG/ML IJ SOLN
INTRAMUSCULAR | Status: DC | PRN
  Administered 2016-01-27: 30 mg via INTRAVENOUS

## 2016-01-27 MED ORDER — ROCURONIUM BROMIDE 10 MG/ML (PF) SYRINGE
PREFILLED_SYRINGE | INTRAVENOUS | Status: DC | PRN
  Administered 2016-01-27: 40 mg via INTRAVENOUS

## 2016-01-27 SURGICAL SUPPLY — 35 items
BARRIER ADHS 3X4 INTERCEED (GAUZE/BANDAGES/DRESSINGS) IMPLANT
CLOTH BEACON ORANGE TIMEOUT ST (SAFETY) ×3 IMPLANT
DRSG OPSITE POSTOP 3X4 (GAUZE/BANDAGES/DRESSINGS) ×3 IMPLANT
FILTER SMOKE EVAC LAPAROSHD (FILTER) ×3 IMPLANT
GLOVE BIO SURGEON ST LM GN SZ9 (GLOVE) ×3 IMPLANT
GLOVE BIOGEL PI IND STRL 7.0 (GLOVE) ×1 IMPLANT
GLOVE BIOGEL PI IND STRL 9 (GLOVE) ×2 IMPLANT
GLOVE BIOGEL PI INDICATOR 7.0 (GLOVE) ×2
GLOVE BIOGEL PI INDICATOR 9 (GLOVE) ×4
GOWN STRL REUS W/TWL 2XL LVL3 (GOWN DISPOSABLE) ×3 IMPLANT
GOWN STRL REUS W/TWL LRG LVL3 (GOWN DISPOSABLE) ×6 IMPLANT
LIGASURE LAP ATLAS 10MM 37CM (INSTRUMENTS) IMPLANT
LIGASURE VESSEL 5MM BLUNT TIP (ELECTROSURGICAL) IMPLANT
LIQUID BAND (GAUZE/BANDAGES/DRESSINGS) ×3 IMPLANT
NEEDLE INSUFFLATION 120MM (ENDOMECHANICALS) ×3 IMPLANT
NS IRRIG 1000ML POUR BTL (IV SOLUTION) ×3 IMPLANT
PACK LAPAROSCOPY BASIN (CUSTOM PROCEDURE TRAY) ×3 IMPLANT
PACK TRENDGUARD 450 HYBRID PRO (MISCELLANEOUS) IMPLANT
PACK TRENDGUARD 600 HYBRD PROC (MISCELLANEOUS) IMPLANT
POUCH SPECIMEN RETRIEVAL 10MM (ENDOMECHANICALS) ×3 IMPLANT
PROTECTOR NERVE ULNAR (MISCELLANEOUS) ×6 IMPLANT
SCISSORS LAP 5X35 DISP (ENDOMECHANICALS) IMPLANT
SET IRRIG TUBING LAPAROSCOPIC (IRRIGATION / IRRIGATOR) IMPLANT
SHEARS HARMONIC ACE PLUS 36CM (ENDOMECHANICALS) IMPLANT
SLEEVE XCEL OPT CAN 5 100 (ENDOMECHANICALS) ×3 IMPLANT
SUT VIC AB 4-0 PS2 27 (SUTURE) ×3 IMPLANT
SUT VICRYL 0 UR6 27IN ABS (SUTURE) ×6 IMPLANT
TOWEL OR 17X24 6PK STRL BLUE (TOWEL DISPOSABLE) ×6 IMPLANT
TRAY FOLEY CATH SILVER 14FR (SET/KITS/TRAYS/PACK) ×3 IMPLANT
TRENDGUARD 450 HYBRID PRO PACK (MISCELLANEOUS)
TRENDGUARD 600 HYBRID PROC PK (MISCELLANEOUS)
TROCAR OPTI TIP 5M 100M (ENDOMECHANICALS) ×3 IMPLANT
TROCAR XCEL NON-BLD 11X100MML (ENDOMECHANICALS) ×6 IMPLANT
WARMER LAPAROSCOPE (MISCELLANEOUS) ×3 IMPLANT
WATER STERILE IRR 1000ML POUR (IV SOLUTION) ×3 IMPLANT

## 2016-01-27 NOTE — Brief Op Note (Signed)
01/27/2016  10:42 PM  PATIENT:  Anita GourdSyretta M Schlack  35 y.o. female  PRE-OPERATIVE DIAGNOSIS:  ectopic pregnancy  POST-OPERATIVE DIAGNOSIS:  LEFT ECTOPIC PREGNANCY  PROCEDURE:  Procedure(s): DIAGNOSTIC LAPAROSCOPY WITH REMOVAL OF LEFT ECTOPIC PREGNANCY (Left)  SURGEON:  Surgeon(s) and Role:    * Tilda BurrowJohn V Marcene Laskowski, MD - Primary  PHYSICIAN ASSISTANT:   ASSISTANTS: none   ANESTHESIA:   local and general  EBL:  Total I/O In: 1200 [I.V.:1200] Out: 205 [Urine:200; Blood:5]  BLOOD ADMINISTERED:none  DRAINS: none   LOCAL MEDICATIONS USED:  MARCAINE     SPECIMEN:  Source of Specimen:  Left fallopian tube, open  DISPOSITION OF SPECIMEN:  PATHOLOGY  COUNTS:  YES  TOURNIQUET:  * No tourniquets in log *  DICTATION: .Dragon Dictation  PLAN OF CARE: Discharge to home after PACU  PATIENT DISPOSITION:  PACU - hemodynamically stable.   Delay start of Pharmacological VTE agent (>24hrs) due to surgical blood loss or risk of bleeding: not applicable Details of procedure seeing the operating room prepped and draped for abdominal surgery with legs in low lithotomy, with Foley catheter in place in tenaculum attached to the cervix. As conducted. The patient's umbilicus was opened with a vertical incision over the redundant small hernia, and pneumoperitoneum easily achieved through the fascial defect the #11 mm trocar was inserted through the hernia and attention directed to the pelvis. There were omental adhesions to the anterior abdominal wall which were taken down after 11 trocar placed suprapubically and 5 mm in the right lower quadrant. Attention then directed to the pelvis where the uterus was normal there was minimal blood in the pelvis and the left fallopian tube was grossly distended with ectopic pregnancy in the ampullary portion. Left salpingectomy was performed using harmonic scalpel hemostasis was good. Specimen was extracted through the umbilical site difficulty. The pelvis was inspected  confirmed as hemostatic, blood type confirmed as O+, deflation of the abdomen, and closure of the trocar sites. The suprapubic and umbilical sites were closed fascial level. There was redundant skin of the umbilicus was partially trimmed with now that the fat had been pushed out of the distended skin, so the redundant skin was trimmed to allow for more l normal umbilicus Patient was taken to the recovery room in stable condition. EBL minimal,,

## 2016-01-27 NOTE — H&P (Signed)
Anita Horn, NP Nurse Practitioner Attested Obstetrics/Gynecology  MAU Provider Note Date of Service: 01/27/2016 2:41 PM    Attestation signed by Tilda Burrow, MD at 01/27/2016 7:40 PM  `````Attestation of Attending Supervision of Advanced Practitioner: Evaluation and management procedures were performed by the PA/NP/CNM/OB Fellow under my supervision/collaboration. Chart reviewed and agree with management and plan.  Tilda Burrow 01/27/2016 7:36 PM   `````Attestation of Attending Supervision of Advanced Practitioner: Evaluation and management procedures were performed by the PA/NP/CNM/OB Fellow under my supervision/collaboration. Chart reviewed and agree with management and plan. I have reviewed the ultrasound photos, confirmed the stability of pt  Status at this time, and now discussed risks,befits, treatment options and recommended surgical treatment of ectopic due to its advanced gestation. I cannot be certain whether this is tubal or ovarian, and have explained the range of treatment that may be necessary including open laparotomy, removal of tube and/or ovary or only portion of ovary. Patient is stable, and so we will do as soon as pt has been 8 hrs NPO from last Solid food, at 8 pm. IV started. Labs reviewed, Currently no plans for crossmatch due to stability. Tilda Burrow 01/27/2016 7:36 PM       Expand All Collapse All   [] Hide copied text [] Hover for attribution information  History     CSN: [id number]  Arrival date and time: 01/27/16 1357   First Provider Initiated Contact with Patient 01/27/16 1441         Chief Complaint  Patient presents with  . Abdominal Pain  . Vaginal Bleeding   HPI  Anita Fuentes is a 35 y.o. [id number] at [redacted]w[redacted]d by unsure LMP who presents with abdominal pain & vaginal bleeding. Reports intermittent episodes of bleeding for the last month. Weekly episodes of vaginal spotting that last for 3-4 days at a time. Bleeding described as lighter  than menses. No vaginal bleeding today.  Intermittent lower abdominal cramping since last week. Describes as abdominal cramping vs contraction like pain. Currently no pain. Last pain occurred earlier today in RLQ that lasted for a few minutes.  Denies n/v/d, constipation, dysuria, fever.           OB History    Gravida Para Term Preterm AB Living   5 4 4  0 0 3   SAB TAB Ectopic Multiple Live Births   0 0 0 0 4      Past Medical History:  Diagnosis Date  . Abnormal Pap smear    Medication was treatment  . Exposure to STD 11/04/2011  . H/O candidiasis   . History of anemia 11/04/2011  . History of chlamydia 1998  . History of gonorrhea   . Hx of syphilis 1998  . Hx: UTI (urinary tract infection)   . Hypertension   . Migraines    TYPICALLY TAKES EXCEDERIN MIGRAINE  . Yeast infection          Past Surgical History:  Procedure Laterality Date  . CESAREAN SECTION  2003, 2004, 2008,2013   x 4  . CESAREAN SECTION  03/23/2012   Procedure: CESAREAN SECTION;  Surgeon: Kirkland Hun, MD;  Location: [facility];  Service: Obstetrics;  Laterality: N/A;  Repeat          Family History  Problem Relation Age of Onset  . Diabetes Father   . Hypertension Father   . Hypertension Sister   . Diabetes Sister     ADULT ONSET  . Hypertension Mother   . Heart disease  Mother   . Diabetes Mother   . Thyroid disease Maternal Uncle   . Heart disease Maternal Grandmother   . Hypertension Maternal Grandmother   . Diabetes Maternal Grandmother   . Diabetes Paternal Grandmother           Social History  Substance Use Topics  . Smoking status: Former Smoker    Packs/day: 1.00    Years: 15.00    Types: Cigarettes    Quit date: 01/03/2014  . Smokeless tobacco: Current User     Comment: PT COUNSELED TO TRY DECREASING AMOUT SHE SMOKES A DAY UNTIL SHE CAN EVENTUALLY QUIT  . Alcohol use No     Comment: "    Allergies: No Known  Allergies         Prescriptions Prior to Admission  Medication Sig Dispense Refill Last Dose  . hydrochlorothiazide (MICROZIDE) 12.5 MG capsule Take 1 capsule (12.5 mg total) by mouth daily. 30 capsule 0   . traZODone (DESYREL) 100 MG tablet Take 1 tablet (100 mg total) by mouth at bedtime. 30 tablet 0     Review of Systems  Constitutional: Negative.   Gastrointestinal: Positive for abdominal pain. Negative for constipation, diarrhea, nausea and vomiting.  Genitourinary: Negative for dysuria.       + vaginal spotting, none currently   Physical Exam   Blood pressure 132/77, pulse 84, temperature 98.3 F (36.8 C), temperature source Oral, resp. rate 16.  Physical Exam  Nursing note and vitals reviewed. Constitutional: She is oriented to person, place, and time. She appears well-developed and well-nourished. No distress.  HENT:  Head: Normocephalic and atraumatic.  Eyes: Conjunctivae are normal. Right eye exhibits no discharge. Left eye exhibits no discharge. No scleral icterus.  Neck: Normal range of motion.  Cardiovascular: Normal rate.   Respiratory: Effort normal. No respiratory distress.  GI: Soft. She exhibits no distension. There is no tenderness. There is no rebound and no guarding.  Genitourinary: Cervix exhibits no motion tenderness and no friability. No bleeding in the vagina. Vaginal discharge (minimal amount of thick white discharge) found.  Genitourinary Comments: Cervix closed  Neurological: She is alert and oriented to person, place, and time.  Skin: Skin is warm and dry. She is not diaphoretic.  Psychiatric: She has a normal mood and affect. Her behavior is normal. Judgment and thought content normal.    MAU Course  Procedures Lab Results Last 24 Hours       Results for orders placed or performed during the hospital encounter of 01/27/16 (from the past 24 hour(s))  Urinalysis, Routine w reflex microscopic (not at [facility])     Status: Abnormal    Collection Time: 01/27/16  2:10 PM  Result Value Ref Range   Color, Urine AMBER (A) YELLOW   APPearance CLEAR CLEAR   Specific Gravity, Urine >1.030 (H) 1.005 - 1.030   pH 6.0 5.0 - 8.0   Glucose, UA NEGATIVE NEGATIVE mg/dL   Hgb urine dipstick NEGATIVE NEGATIVE   Bilirubin Urine NEGATIVE NEGATIVE   Ketones, ur NEGATIVE NEGATIVE mg/dL   Protein, ur NEGATIVE NEGATIVE mg/dL   Nitrite NEGATIVE NEGATIVE   Leukocytes, UA NEGATIVE NEGATIVE  Pregnancy, urine POC     Status: Abnormal   Collection Time: 01/27/16  2:16 PM  Result Value Ref Range   Preg Test, Ur POSITIVE (A) NEGATIVE  CBC     Status: Abnormal   Collection Time: 01/27/16  2:45 PM  Result Value Ref Range   WBC 8.6 4.0 - 10.5  K/uL   RBC 3.67 (L) 3.87 - 5.11 MIL/uL   Hemoglobin 12.6 12.0 - 15.0 g/dL   HCT 04.535.1 (L) 40.936.0 - 81.146.0 %   MCV 95.6 78.0 - 100.0 fL   MCH 34.3 (H) 26.0 - 34.0 pg   MCHC 35.9 30.0 - 36.0 g/dL   RDW 91.412.6 78.211.5 - 95.615.5 %   Platelets 196 150 - 400 K/uL  hCG, quantitative, pregnancy     Status: Abnormal   Collection Time: 01/27/16  2:45 PM  Result Value Ref Range   hCG, Beta Chain, Quant, S 8,651 (H) <5 mIU/mL  Wet prep, genital     Status: Abnormal   Collection Time: 01/27/16  3:15 PM  Result Value Ref Range   Yeast Wet Prep HPF POC NONE SEEN NONE SEEN   Trich, Wet Prep NONE SEEN NONE SEEN   Clue Cells Wet Prep HPF POC PRESENT (A) NONE SEEN   WBC, Wet Prep HPF POC MODERATE (A) NONE SEEN   Sperm NONE SEEN      [place] Ob Comp Less 14 Wks  Result Date: 01/27/2016 CLINICAL DATA:  35 year old pregnant female presents with intermittent vaginal bleeding and abdominal pain. Quantitative beta HCG [id number]. EDC by LMP: 09/06/2016, projecting to an expected gestational age of [redacted] weeks 1 day. EXAM: OBSTETRIC <14 WK US AND TRANSVAGINAL OB US TECHNIQUE: Both transabdominal and transvaginal ultrasound examinations were performed for complete evaluation of the gestation as well as the maternal  uterus, adnexal regions, and pelvic cul-de-sac. Transvaginal technique was performed to assess early pregnancy. COMPARISON:  No prior scans from this gestation. FINDINGS: The anteverted anteflexed uterus measures 9.6 x 4.8 x 5.6 cm in size. There is a Cesarean scar in the anterior lower uterine segment. There is trace fluid deep to the Cesarean scar site. No uterine fibroids or other myometrial abnormalities. No intrauterine gestational sac is demonstrated. Endometrium measures 4 mm in bilayer thickness. No endometrial cavity fluid or focal endometrial mass demonstrated. A few punctate echogenic foci are seen along the endometrial myometrial interface, which could be from prior endometrial instrumentation. Right ovary measures 2.8 x 2.3 x 2.5 cm and is normal. No right adnexal mass. Left ovary measures 3.8 x 1.5 x 2.0 cm and is normal. Between the left ovary and uterus, there is a viable left tubal ectopic gestation measuring 2.3 x 1.5 x 1.9 cm (outer diameters). A yolk sac and embryo are present within the left tubal ectopic gestational sac. The embryo demonstrates a crown-rump length of 5.0 mm, corresponding to a gestational age of [redacted] weeks 1 day. There is embryonic cardiac activity with embryonic heart rate 83 beats per minute. No free fluid or hematoma demonstrated in the left adnexa or cul-de-sac. IMPRESSION: 1. Viable left tubal ectopic gestation at 6 weeks 1 day by crown-rump length. No hemoperitoneum or other findings of tubal rupture. 2. No intrauterine gestation. These results were called by telephone at the time of interpretation on 01/27/2016 at 4:27 pm to Dr. Judeth HornERIN LAWRENCE , who verbally acknowledged these results. Electronically Signed   By: Delbert PhenixJason A Poff M.D.   On: 01/27/2016 16:30   [place] Ob Transvaginal  Result Date: 01/27/2016 CLINICAL DATA:  35 year old pregnant female presents with intermittent vaginal bleeding and abdominal pain. Quantitative beta HCG [id number]. EDC by LMP: 09/06/2016, projecting to  an expected gestational age of [redacted] weeks 1 day. EXAM: OBSTETRIC <14 WK US AND TRANSVAGINAL OB US TECHNIQUE: Both transabdominal and transvaginal ultrasound examinations were performed for complete evaluation of the gestation as  well as the maternal uterus, adnexal regions, and pelvic cul-de-sac. Transvaginal technique was performed to assess early pregnancy. COMPARISON:  No prior scans from this gestation. FINDINGS: The anteverted anteflexed uterus measures 9.6 x 4.8 x 5.6 cm in size. There is a Cesarean scar in the anterior lower uterine segment. There is trace fluid deep to the Cesarean scar site. No uterine fibroids or other myometrial abnormalities. No intrauterine gestational sac is demonstrated. Endometrium measures 4 mm in bilayer thickness. No endometrial cavity fluid or focal endometrial mass demonstrated. A few punctate echogenic foci are seen along the endometrial myometrial interface, which could be from prior endometrial instrumentation. Right ovary measures 2.8 x 2.3 x 2.5 cm and is normal. No right adnexal mass. Left ovary measures 3.8 x 1.5 x 2.0 cm and is normal. Between the left ovary and uterus, there is a viable left tubal ectopic gestation measuring 2.3 x 1.5 x 1.9 cm (outer diameters). A yolk sac and embryo are present within the left tubal ectopic gestational sac. The embryo demonstrates a crown-rump length of 5.0 mm, corresponding to a gestational age of [redacted] weeks 1 day. There is embryonic cardiac activity with embryonic heart rate 83 beats per minute. No free fluid or hematoma demonstrated in the left adnexa or cul-de-sac. IMPRESSION: 1. Viable left tubal ectopic gestation at 6 weeks 1 day by crown-rump length. No hemoperitoneum or other findings of tubal rupture. 2. No intrauterine gestation. These results were called by telephone at the time of interpretation on 01/27/2016 at 4:27 pm to Dr. Judeth Fuentes , who verbally acknowledged these results. Electronically Signed   By: Delbert Phenix M.D.    On: 01/27/2016 16:30    MDM +UPT UA, wet prep, GC/chlamydia, CBC, ABO/Rh, quant hCG, HIV, and [place] today to rule out ectopic pregnancy Ultrasound shows left sided ectopic pregnancy with cardiac activity measuring [redacted]w[redacted]d S/w Dr. Emelda Fear who will come see patient  Assessment and Plan  A:  1. Ectopic pregnancy without intrauterine pregnancy, unspecified location   2. Vaginal bleeding in pregnancy, first trimester   3. Abdominal pain affecting pregnancy     P: Patient to OR per Dr. Leonie Green 01/27/2016, 2:41 PM     Cosigned by: Tilda Burrow, MD at 01/27/2016 7:40 PM

## 2016-01-27 NOTE — Discharge Instructions (Signed)
Ruptured Ectopic Pregnancy °An ectopic pregnancy is when the fertilized egg attaches (implants) outside the uterus. Most ectopic pregnancies occur in the fallopian tube. Rarely do ectopic pregnancies occur on the ovary, intestine, pelvis, or cervix. An ectopic pregnancy does not have the ability to develop into a normal, healthy baby.  °A ruptured ectopic pregnancy is one in which the fallopian tube gets torn or bursts and results in internal bleeding. Often there is intense abdominal pain, and sometimes, vaginal bleeding. Having an ectopic pregnancy can be a life-threatening experience. If left untreated, this dangerous condition can lead to a blood transfusion, abdominal surgery, or even death.  °CAUSES  °Damage to the fallopian tubes is the suspected cause in most ectopic pregnancies.  °RISK FACTORS °Depending on your circumstances, the amount of risk of having an ectopic pregnancy will vary. There are 3 categories that may help you identify whether you are potentially at risk. °High Risk °· You have gone through infertility treatment. °· You have had a previous ectopic pregnancy. °· You have had previous tubal surgery. °· You have had previous surgery to have the fallopian tubes tied (tubal ligation). °· You have tubal problems or diseases. °· You have been exposed to DES. DES is a medicine that was used until 1971 and had effects on babies whose mothers took the medicine. °· You become pregnant while using an intrauterine device (IUD) for birth control.  °Moderate Risk °· You have a history of infertility. °· You have a history of a sexually transmitted infection (STI). °· You have a history of pelvic inflammatory disease (PID). °· You have scarring from endometriosis. °· You have multiple sexual partners. °· You smoke.  °Low Risk °· You have had previous pelvic surgery. °· You use vaginal douching. °· You became sexually active before 35 years of age. °SYMPTOMS °An ectopic pregnancy should be suspected in  anyone who has missed a period and has abdominal pain or bleeding. °· You may experience normal pregnancy symptoms, such as: °¨ Nausea. °¨ Tiredness. °¨ Breast tenderness. °· Symptoms that are not normal include: °¨ Pain with intercourse. °¨ Irregular vaginal bleeding or spotting. °¨ Cramping or pain on one side, or in the lower abdomen. °¨ Fast heartbeat. °¨ Passing out while having a bowel movement. °· Symptoms of a ruptured ectopic pregnancy and internal bleeding may include: °¨ Sudden, severe pain in the abdomen and pelvis. °¨ Dizziness or fainting. °¨ Pain in the shoulder area. °DIAGNOSIS  °Tests that may be performed include: °· A pregnancy test. °· An ultrasound. °· Testing the specific level of pregnancy hormone in the bloodstream. °· Taking a sample of uterus tissue (dilation and curettage, D&C). °· Surgery to perform a visual exam of the inside of the abdomen using a lighted tube (laparoscopy). °TREATMENT  °Laparoscopic surgery or abdominal surgery is recommended for a ruptured ectopic pregnancy.  °· The whole fallopian tube may need to be removed (salpingectomy). °· If the tube is not too damaged, the tube may be saved, and the pregnancy will be surgically removed. In time, the tube may still function. °· If you have lost a lot of blood, you may need a blood transfusion. °· You may receive a Rho (D) immune globulin shot if you are Rh negative and the father is Rh positive, or if you do not know the Rh type of the father. This is to prevent problems with any future pregnancy. °SEEK IMMEDIATE MEDICAL CARE IF:  °You have any symptoms of an ectopic or ruptured ectopic pregnancy. This   is a medical emergency. MAKE SURE YOU:  Understand these instructions.  Will watch your condition.  Will get help right away if you are not doing well or get worse.   This information is not intended to replace advice given to you by your health care provider. Make sure you discuss any questions you have with your health  care provider.   Document Released: 04/15/2000 Document Revised: 04/23/2013 Document Reviewed: 01/28/2013 Elsevier Interactive Patient Education 2016 [employer]  Do not take Motrin until after 4am.  Drink enough liquids to keep urine clear to light yellow. Remove bandages after 48hrs.  No shower until late tomorrow evening.  No baths, sex, tampons, douching until follow up appt.    General Anesthesia, Adult, Care After Refer to this sheet in the next few weeks. These instructions provide you with information on caring for yourself after your procedure. Your health care provider may also give you more specific instructions. Your treatment has been planned according to current medical practices, but problems sometimes occur. Call your health care provider if you have any problems or questions after your procedure. WHAT TO EXPECT AFTER THE PROCEDURE After the procedure, it is typical to experience:  Sleepiness.  Nausea and vomiting. HOME CARE INSTRUCTIONS  For the first 24 hours after general anesthesia:  Have a responsible person with you.  Do not drive a car. If you are alone, do not take public transportation.  Do not drink alcohol.  Do not take medicine that has not been prescribed by your health care provider.  Do not sign important papers or make important decisions.  You may resume a normal diet and activities as directed by your health care provider.  Change bandages (dressings) as directed.  If you have questions or problems that seem related to general anesthesia, call the hospital and ask for the anesthetist or anesthesiologist on call. SEEK MEDICAL CARE IF:  You have nausea and vomiting that continue the day after anesthesia.  You develop a rash. SEEK IMMEDIATE MEDICAL CARE IF:   You have difficulty breathing.  You have chest pain.  You have any allergic problems.   This information is not intended to replace advice given to you by your health care  provider. Make sure you discuss any questions you have with your health care provider.   Document Released: 07/25/2000 Document Revised: 05/09/2014 Document Reviewed: 08/17/2011 Elsevier Interactive Patient Education 2016 [employer] Ectopic Pregnancy An ectopic pregnancy is when the fertilized egg attaches (implants) outside the uterus. Most ectopic pregnancies occur in the fallopian tube. Rarely do ectopic pregnancies occur on the ovary, intestine, pelvis, or cervix. In an ectopic pregnancy, the fertilized egg does not have the ability to develop into a normal, healthy baby.  A ruptured ectopic pregnancy is one in which the fallopian tube gets torn or bursts and results in internal bleeding. Often there is intense abdominal pain, and sometimes, vaginal bleeding. Having an ectopic pregnancy can be life threatening. If left untreated, this dangerous condition can lead to a blood transfusion, abdominal surgery, or even death. CAUSES  Damage to the fallopian tubes is the suspected cause in most ectopic pregnancies.  RISK FACTORS Depending on your circumstances, the risk of having an ectopic pregnancy will vary. The level of risk can be divided into three categories. High Risk  You have gone through infertility treatment.  You have had a previous ectopic pregnancy.  You have had previous tubal surgery.  You have had previous surgery to have the fallopian  tubes tied (tubal ligation).  You have tubal problems or diseases.  You have been exposed to DES. DES is a medicine that was used until 1971 and had effects on babies whose mothers took the medicine.  You become pregnant while using an intrauterine device (IUD) for birth control. Moderate Risk  You have a history of infertility.  You have a history of a sexually transmitted infection (STI).  You have a history of pelvic inflammatory disease (PID).  You have scarring from endometriosis.  You have multiple sexual partners.  You  smoke. Low Risk  You have had previous pelvic surgery.  You use vaginal douching.  You became sexually active before 35 years of age. SIGNS AND SYMPTOMS  An ectopic pregnancy should be suspected in anyone who has missed a period and has abdominal pain or bleeding.  You may experience normal pregnancy symptoms, such as:  Nausea.  Tiredness.  Breast tenderness.  Other symptoms may include:  Pain with intercourse.  Irregular vaginal bleeding or spotting.  Cramping or pain on one side or in the lower abdomen.  Fast heartbeat.  Passing out while having a bowel movement.  Symptoms of a ruptured ectopic pregnancy and internal bleeding may include:  Sudden, severe pain in the abdomen and pelvis.  Dizziness or fainting.  Pain in the shoulder area. DIAGNOSIS  Tests that may be performed include:  A pregnancy test.  An ultrasound test.  Testing the specific level of pregnancy hormone in the bloodstream.  Taking a sample of uterus tissue (dilation and curettage, D&C).  Surgery to perform a visual exam of the inside of the abdomen using a thin, lighted tube with a tiny camera on the end (laparoscope). TREATMENT  An injection of a medicine called methotrexate may be given. This medicine causes the pregnancy tissue to be absorbed. It is given if:  The diagnosis is made early.  The fallopian tube has not ruptured.  You are considered to be a good candidate for the medicine. Usually, pregnancy hormone blood levels are checked after methotrexate treatment. This is to be sure the medicine is effective. It may take 4-6 weeks for the pregnancy to be absorbed (though most pregnancies will be absorbed by 3 weeks). Surgical treatment may be needed. A laparoscope may be used to remove the pregnancy tissue. If severe internal bleeding occurs, a cut (incision) may be made in the lower abdomen (laparotomy), and the ectopic pregnancy is removed. This stops the bleeding. Part of the  fallopian tube, or the whole tube, may be removed as well (salpingectomy). After surgery, pregnancy hormone tests may be done to be sure there is no pregnancy tissue left. You may receive a Rho (D) immune globulin shot if you are Rh negative and the father is Rh positive, or if you do not know the Rh type of the father. This is to prevent problems with any future pregnancy. SEEK IMMEDIATE MEDICAL CARE IF:  You have any symptoms of an ectopic pregnancy. This is a medical emergency. MAKE SURE YOU:  Understand these instructions.  Will watch your condition.  Will get help right away if you are not doing well or get worse.   This information is not intended to replace advice given to you by your health care provider. Make sure you discuss any questions you have with your health care provider.   Document Released: 05/26/2004 Document Revised: 05/09/2014 Document Reviewed: 11/15/2012 Elsevier Interactive Patient Education [employer]

## 2016-01-27 NOTE — Anesthesia Preprocedure Evaluation (Signed)
Anesthesia Evaluation  Patient identified by MRN, date of birth, ID band Patient awake    Reviewed: Allergy & Precautions, H&P , Patient's Chart, lab work & pertinent test results, reviewed documented beta blocker date and time   Airway Mallampati: II  TM Distance: >3 FB Neck ROM: full    Dental no notable dental hx.    Pulmonary former smoker,    Pulmonary exam normal breath sounds clear to auscultation       Cardiovascular hypertension, On Medications  Rhythm:regular Rate:Normal     Neuro/Psych    GI/Hepatic   Endo/Other    Renal/GU      Musculoskeletal   Abdominal   Peds  Hematology   Anesthesia Other Findings   Reproductive/Obstetrics                             Anesthesia Physical Anesthesia Plan  ASA: II  Anesthesia Plan: General   Post-op Pain Management:    Induction: Intravenous  Airway Management Planned: Oral ETT  Additional Equipment:   Intra-op Plan:   Post-operative Plan: Extubation in OR  Informed Consent: I have reviewed the patients History and Physical, chart, labs and discussed the procedure including the risks, benefits and alternatives for the proposed anesthesia with the patient or authorized representative who has indicated his/her understanding and acceptance.   Dental Advisory Given and Dental advisory given  Plan Discussed with: CRNA and Surgeon  Anesthesia Plan Comments: (  Discussed general anesthesia, including possible nausea, instrumentation of airway, sore throat,pulmonary aspiration, etc. I asked if the were any outstanding questions, or  concerns before we proceeded. )        Anesthesia Quick Evaluation  

## 2016-01-27 NOTE — Op Note (Signed)
Please see the brief operative note for surgical details 

## 2016-01-27 NOTE — Transfer of Care (Signed)
Immediate Anesthesia Transfer of Care Note  Patient: Anita Fuentes  Procedure(s) Performed: Procedure(s): DIAGNOSTIC LAPAROSCOPY WITH REMOVAL OF LEFT ECTOPIC PREGNANCY (Left)  Patient Location: PACU  Anesthesia Type:General  Level of Consciousness: awake, oriented and patient cooperative  Airway & Oxygen Therapy: Patient Spontanous Breathing and Patient connected to nasal cannula oxygen  Post-op Assessment: Report given to RN and Post -op Vital signs reviewed and stable  Post vital signs: Reviewed and stable  Last Vitals:  Vitals:   01/27/16 1416  BP: 132/77  Pulse: 84  Resp: 16  Temp: 36.8 C    Last Pain:  Vitals:   01/27/16 1422  TempSrc:   PainSc: 4          Complications: No apparent anesthesia complications

## 2016-01-27 NOTE — Anesthesia Procedure Notes (Signed)
Procedure Name: Intubation Date/Time: 01/27/2016 8:50 PM Performed by: Janeece AgeeWRAPE, Deloris Moger W Pre-anesthesia Checklist: Patient identified, Emergency Drugs available, Suction available, Patient being monitored and Timeout performed Patient Re-evaluated:Patient Re-evaluated prior to inductionOxygen Delivery Method: Circle system utilized Preoxygenation: Pre-oxygenation with 100% oxygen Intubation Type: IV induction Ventilation: Mask ventilation without difficulty Grade View: Grade II Tube type: Oral Tube size: 7.0 mm Number of attempts: 1 Airway Equipment and Method: Stylet Secured at: 21 cm Tube secured with: Tape Dental Injury: Teeth and Oropharynx as per pre-operative assessment

## 2016-01-27 NOTE — MAU Note (Signed)
Pt presents to MAU with abdominal pain that comes and goes for the last [redacted] weeks along with vaginal bleeding. Vaginal bleeding will last 2-3 days and stop. Pain has not been as bad this week. + home pregnancy test yesterday.

## 2016-01-27 NOTE — MAU Provider Note (Signed)
History     CSN: [id number]  Arrival date and time: 01/27/16 1357   First Provider Initiated Contact with Patient 01/27/16 1441      Chief Complaint  Patient presents with  . Abdominal Pain  . Vaginal Bleeding   HPI  Anita Fuentes is a 35 y.o. [id number] at [redacted]w[redacted]d by unsure LMP who presents with abdominal pain & vaginal bleeding. Reports intermittent episodes of bleeding for the last month. Weekly episodes of vaginal spotting that last for 3-4 days at a time. Bleeding described as lighter than menses. No vaginal bleeding today.  Intermittent lower abdominal cramping since last week. Describes as abdominal cramping vs contraction like pain. Currently no pain. Last pain occurred earlier today in RLQ that lasted for a few minutes.  Denies n/v/d, constipation, dysuria, fever.   OB History    Gravida Para Term Preterm AB Living   5 4 4  0 0 3   SAB TAB Ectopic Multiple Live Births   0 0 0 0 4      Past Medical History:  Diagnosis Date  . Abnormal Pap smear    Medication was treatment  . Exposure to STD 11/04/2011  . H/O candidiasis   . History of anemia 11/04/2011  . History of chlamydia 1998  . History of gonorrhea   . Hx of syphilis 1998  . Hx: UTI (urinary tract infection)   . Hypertension   . Migraines    TYPICALLY TAKES EXCEDERIN MIGRAINE  . Yeast infection     Past Surgical History:  Procedure Laterality Date  . CESAREAN SECTION  2003, 2004, 2008,2013   x 4  . CESAREAN SECTION  03/23/2012   Procedure: CESAREAN SECTION;  Surgeon: Kirkland Hun, MD;  Location: [facility];  Service: Obstetrics;  Laterality: N/A;  Repeat    Family History  Problem Relation Age of Onset  . Diabetes Father   . Hypertension Father   . Hypertension Sister   . Diabetes Sister     ADULT ONSET  . Hypertension Mother   . Heart disease Mother   . Diabetes Mother   . Thyroid disease Maternal Uncle   . Heart disease Maternal Grandmother   . Hypertension Maternal Grandmother   . Diabetes  Maternal Grandmother   . Diabetes Paternal Grandmother     Social History  Substance Use Topics  . Smoking status: Former Smoker    Packs/day: 1.00    Years: 15.00    Types: Cigarettes    Quit date: 01/03/2014  . Smokeless tobacco: Current User     Comment: PT COUNSELED TO TRY DECREASING AMOUT SHE SMOKES A DAY UNTIL SHE CAN EVENTUALLY QUIT  . Alcohol use No     Comment: "    Allergies: No Known Allergies  Prescriptions Prior to Admission  Medication Sig Dispense Refill Last Dose  . hydrochlorothiazide (MICROZIDE) 12.5 MG capsule Take 1 capsule (12.5 mg total) by mouth daily. 30 capsule 0   . traZODone (DESYREL) 100 MG tablet Take 1 tablet (100 mg total) by mouth at bedtime. 30 tablet 0     Review of Systems  Constitutional: Negative.   Gastrointestinal: Positive for abdominal pain. Negative for constipation, diarrhea, nausea and vomiting.  Genitourinary: Negative for dysuria.       + vaginal spotting, none currently   Physical Exam   Blood pressure 132/77, pulse 84, temperature 98.3 F (36.8 C), temperature source Oral, resp. rate 16.  Physical Exam  Nursing note and vitals reviewed. Constitutional: She is oriented  to person, place, and time. She appears well-developed and well-nourished. No distress.  HENT:  Head: Normocephalic and atraumatic.  Eyes: Conjunctivae are normal. Right eye exhibits no discharge. Left eye exhibits no discharge. No scleral icterus.  Neck: Normal range of motion.  Cardiovascular: Normal rate.   Respiratory: Effort normal. No respiratory distress.  GI: Soft. She exhibits no distension. There is no tenderness. There is no rebound and no guarding.  Genitourinary: Cervix exhibits no motion tenderness and no friability. No bleeding in the vagina. Vaginal discharge (minimal amount of thick white discharge) found.  Genitourinary Comments: Cervix closed  Neurological: She is alert and oriented to person, place, and time.  Skin: Skin is warm and dry.  She is not diaphoretic.  Psychiatric: She has a normal mood and affect. Her behavior is normal. Judgment and thought content normal.    MAU Course  Procedures Results for orders placed or performed during the hospital encounter of 01/27/16 (from the past 24 hour(s))  Urinalysis, Routine w reflex microscopic (not [facility])     Status: Abnormal   Collection Time: 01/27/16  2:10 PM  Result Value Ref Range   Color, Urine AMBER (A) YELLOW   APPearance CLEAR CLEAR   Specific Gravity, Urine >1.030 (H) 1.005 - 1.030   pH 6.0 5.0 - 8.0   Glucose, UA NEGATIVE NEGATIVE mg/dL   Hgb urine dipstick NEGATIVE NEGATIVE   Bilirubin Urine NEGATIVE NEGATIVE   Ketones, ur NEGATIVE NEGATIVE mg/dL   Protein, ur NEGATIVE NEGATIVE mg/dL   Nitrite NEGATIVE NEGATIVE   Leukocytes, UA NEGATIVE NEGATIVE  Pregnancy, urine POC     Status: Abnormal   Collection Time: 01/27/16  2:16 PM  Result Value Ref Range   Preg Test, Ur POSITIVE (A) NEGATIVE  CBC     Status: Abnormal   Collection Time: 01/27/16  2:45 PM  Result Value Ref Range   WBC 8.6 4.0 - 10.5 K/uL   RBC 3.67 (L) 3.87 - 5.11 MIL/uL   Hemoglobin 12.6 12.0 - 15.0 g/dL   HCT 16.135.1 (L) 09.636.0 - 04.546.0 %   MCV 95.6 78.0 - 100.0 fL   MCH 34.3 (H) 26.0 - 34.0 pg   MCHC 35.9 30.0 - 36.0 g/dL   RDW 40.912.6 81.111.5 - 91.415.5 %   Platelets 196 150 - 400 K/uL  hCG, quantitative, pregnancy     Status: Abnormal   Collection Time: 01/27/16  2:45 PM  Result Value Ref Range   hCG, Beta Chain, Quant, S 8,651 (H) <5 mIU/mL  Wet prep, genital     Status: Abnormal   Collection Time: 01/27/16  3:15 PM  Result Value Ref Range   Yeast Wet Prep HPF POC NONE SEEN NONE SEEN   Trich, Wet Prep NONE SEEN NONE SEEN   Clue Cells Wet Prep HPF POC PRESENT (A) NONE SEEN   WBC, Wet Prep HPF POC MODERATE (A) NONE SEEN   Sperm NONE SEEN    [place] Ob Comp Less 14 Wks  Result Date: 01/27/2016 CLINICAL DATA:  35 year old pregnant female presents with intermittent vaginal bleeding and abdominal  pain. Quantitative beta HCG [id number]. EDC by LMP: 09/06/2016, projecting to an expected gestational age of [redacted] weeks 1 day. EXAM: OBSTETRIC <14 WK US AND TRANSVAGINAL OB US TECHNIQUE: Both transabdominal and transvaginal ultrasound examinations were performed for complete evaluation of the gestation as well as the maternal uterus, adnexal regions, and pelvic cul-de-sac. Transvaginal technique was performed to assess early pregnancy. COMPARISON:  No prior scans from this gestation.  FINDINGS: The anteverted anteflexed uterus measures 9.6 x 4.8 x 5.6 cm in size. There is a Cesarean scar in the anterior lower uterine segment. There is trace fluid deep to the Cesarean scar site. No uterine fibroids or other myometrial abnormalities. No intrauterine gestational sac is demonstrated. Endometrium measures 4 mm in bilayer thickness. No endometrial cavity fluid or focal endometrial mass demonstrated. A few punctate echogenic foci are seen along the endometrial myometrial interface, which could be from prior endometrial instrumentation. Right ovary measures 2.8 x 2.3 x 2.5 cm and is normal. No right adnexal mass. Left ovary measures 3.8 x 1.5 x 2.0 cm and is normal. Between the left ovary and uterus, there is a viable left tubal ectopic gestation measuring 2.3 x 1.5 x 1.9 cm (outer diameters). A yolk sac and embryo are present within the left tubal ectopic gestational sac. The embryo demonstrates a crown-rump length of 5.0 mm, corresponding to a gestational age of [redacted] weeks 1 day. There is embryonic cardiac activity with embryonic heart rate 83 beats per minute. No free fluid or hematoma demonstrated in the left adnexa or cul-de-sac. IMPRESSION: 1. Viable left tubal ectopic gestation at 6 weeks 1 day by crown-rump length. No hemoperitoneum or other findings of tubal rupture. 2. No intrauterine gestation. These results were called by telephone at the time of interpretation on 01/27/2016 at 4:27 pm to Dr. Judeth Horn , who verbally  acknowledged these results. Electronically Signed   By: Delbert Phenix M.D.   On: 01/27/2016 16:30   US Ob Transvaginal  Result Date: 01/27/2016 CLINICAL DATA:  35 year old pregnant female presents with intermittent vaginal bleeding and abdominal pain. Quantitative beta HCG [id number]. EDC by LMP: 09/06/2016, projecting to an expected gestational age of [redacted] weeks 1 day. EXAM: OBSTETRIC <14 WK [place] AND TRANSVAGINAL OB US TECHNIQUE: Both transabdominal and transvaginal ultrasound examinations were performed for complete evaluation of the gestation as well as the maternal uterus, adnexal regions, and pelvic cul-de-sac. Transvaginal technique was performed to assess early pregnancy. COMPARISON:  No prior scans from this gestation. FINDINGS: The anteverted anteflexed uterus measures 9.6 x 4.8 x 5.6 cm in size. There is a Cesarean scar in the anterior lower uterine segment. There is trace fluid deep to the Cesarean scar site. No uterine fibroids or other myometrial abnormalities. No intrauterine gestational sac is demonstrated. Endometrium measures 4 mm in bilayer thickness. No endometrial cavity fluid or focal endometrial mass demonstrated. A few punctate echogenic foci are seen along the endometrial myometrial interface, which could be from prior endometrial instrumentation. Right ovary measures 2.8 x 2.3 x 2.5 cm and is normal. No right adnexal mass. Left ovary measures 3.8 x 1.5 x 2.0 cm and is normal. Between the left ovary and uterus, there is a viable left tubal ectopic gestation measuring 2.3 x 1.5 x 1.9 cm (outer diameters). A yolk sac and embryo are present within the left tubal ectopic gestational sac. The embryo demonstrates a crown-rump length of 5.0 mm, corresponding to a gestational age of [redacted] weeks 1 day. There is embryonic cardiac activity with embryonic heart rate 83 beats per minute. No free fluid or hematoma demonstrated in the left adnexa or cul-de-sac. IMPRESSION: 1. Viable left tubal ectopic gestation at 6  weeks 1 day by crown-rump length. No hemoperitoneum or other findings of tubal rupture. 2. No intrauterine gestation. These results were called by telephone at the time of interpretation on 01/27/2016 at 4:27 pm to Dr. Judeth Horn , who verbally acknowledged these results. Electronically  Signed   By: Delbert Phenix M.D.   On: 01/27/2016 16:30    MDM +UPT UA, wet prep, GC/chlamydia, CBC, ABO/Rh, quant hCG, HIV, and [place] today to rule out ectopic pregnancy Ultrasound shows left sided ectopic pregnancy with cardiac activity measuring [redacted]w[redacted]d S/w Dr. Emelda Fear who will come see patient  Assessment and Plan  A:  1. Ectopic pregnancy without intrauterine pregnancy, unspecified location   2. Vaginal bleeding in pregnancy, first trimester   3. Abdominal pain affecting pregnancy     P: Patient to OR per Dr. Leonie Green 01/27/2016, 2:41 PM

## 2016-01-27 NOTE — Anesthesia Postprocedure Evaluation (Signed)
Anesthesia Post Note  Patient: Davis GourdSyretta M Zadrozny  Procedure(s) Performed: Procedure(s) (LRB): DIAGNOSTIC LAPAROSCOPY WITH REMOVAL OF LEFT ECTOPIC PREGNANCY (Left)  Patient location during evaluation: PACU Anesthesia Type: General Level of consciousness: sedated Pain management: satisfactory to patient Vital Signs Assessment: post-procedure vital signs reviewed and stable Respiratory status: spontaneous breathing Cardiovascular status: stable Anesthetic complications: no     Last Vitals:  Vitals:   01/27/16 2245 01/27/16 2300  BP: 126/79 132/89  Pulse: 81 78  Resp: 18 (!) 21  Temp:      Last Pain:  Vitals:   01/27/16 2245  TempSrc:   PainSc: 7    Pain Goal:                 Jiles GarterJACKSON,Wendie Diskin EDWARD

## 2016-01-28 LAB — GC/CHLAMYDIA PROBE AMP (~~LOC~~) NOT AT ARMC
Chlamydia: NEGATIVE
Neisseria Gonorrhea: NEGATIVE

## 2016-01-28 LAB — HIV ANTIBODY (ROUTINE TESTING W REFLEX): HIV SCREEN 4TH GENERATION: NONREACTIVE

## 2016-01-29 ENCOUNTER — Encounter (HOSPITAL_COMMUNITY): Payer: Self-pay | Admitting: Obstetrics and Gynecology

## 2016-02-07 ENCOUNTER — Emergency Department (HOSPITAL_COMMUNITY): Payer: Medicaid Other

## 2016-02-07 ENCOUNTER — Emergency Department (HOSPITAL_COMMUNITY)
Admission: EM | Admit: 2016-02-07 | Discharge: 2016-02-07 | Disposition: A | Discharge status: Home / Self Care | Payer: Medicaid Other | Attending: Emergency Medicine | Admitting: Emergency Medicine

## 2016-02-07 ENCOUNTER — Encounter (HOSPITAL_COMMUNITY): Payer: Self-pay

## 2016-02-07 DIAGNOSIS — W19XXXA Unspecified fall, initial encounter: Secondary | ICD-10-CM | POA: Insufficient documentation

## 2016-02-07 DIAGNOSIS — Z79899 Other long term (current) drug therapy: Secondary | ICD-10-CM | POA: Diagnosis not present

## 2016-02-07 DIAGNOSIS — I10 Essential (primary) hypertension: Secondary | ICD-10-CM | POA: Insufficient documentation

## 2016-02-07 DIAGNOSIS — Y929 Unspecified place or not applicable: Secondary | ICD-10-CM | POA: Diagnosis not present

## 2016-02-07 DIAGNOSIS — S52552A Other extraarticular fracture of lower end of left radius, initial encounter for closed fracture: Secondary | ICD-10-CM

## 2016-02-07 DIAGNOSIS — Z87891 Personal history of nicotine dependence: Secondary | ICD-10-CM | POA: Diagnosis not present

## 2016-02-07 DIAGNOSIS — Y939 Activity, unspecified: Secondary | ICD-10-CM | POA: Insufficient documentation

## 2016-02-07 DIAGNOSIS — S6992XA Unspecified injury of left wrist, hand and finger(s), initial encounter: Secondary | ICD-10-CM | POA: Diagnosis present

## 2016-02-07 DIAGNOSIS — Y999 Unspecified external cause status: Secondary | ICD-10-CM | POA: Insufficient documentation

## 2016-02-07 MED ORDER — OXYCODONE-ACETAMINOPHEN 5-325 MG PO TABS
1.0000 | ORAL_TABLET | Freq: Once | ORAL | Status: AC
  Administered 2016-02-07: 1 via ORAL
  Filled 2016-02-07: qty 1

## 2016-02-07 MED ORDER — OXYCODONE-ACETAMINOPHEN 5-325 MG PO TABS: 1.0000 | ORAL_TABLET | Freq: Four times a day (QID) | ORAL | 0 refills | Status: DC | PRN

## 2016-02-07 MED ORDER — ONDANSETRON HCL 4 MG PO TABS
4.0000 mg | ORAL_TABLET | Freq: Once | ORAL | Status: AC
  Administered 2016-02-07: 4 mg via ORAL
  Filled 2016-02-07: qty 1

## 2016-02-07 NOTE — Progress Notes (Signed)
Orthopedic Tech Progress Note Patient Details:  Anita GourdSyretta M Fuentes 1980-07-28 [id number]  Ortho Devices Type of Ortho Device: Ace wrap, Arm sling, Sugartong splint Ortho Device/Splint Interventions: Application   Saul FordyceJennifer C Brittain Smithey 02/07/2016, 12:07 PM

## 2016-02-07 NOTE — Discharge Instructions (Signed)
Please read and follow all provided instructions.  Your diagnoses today include:  1. Other closed extra-articular fracture of distal end of left radius, initial encounter    Tests performed today include: Vital signs. See below for your results today.   Medications prescribed:  Take as prescribed   Home care instructions:  Follow any educational materials contained in this packet.  Follow-up instructions: Please follow-up with Orthopedics for further evaluation of symptoms and treatment   Return instructions:  Please return to the Emergency Department if you do not get better, if you get worse, or new symptoms OR  - Fever (temperature greater than 101.20F)  - Bleeding that does not stop with holding pressure to the area    -Severe pain (please note that you may be more sore the day after your accident)  - Chest Pain  - Difficulty breathing  - Severe nausea or vomiting  - Inability to tolerate food and liquids  - Passing out  - Skin becoming red around your wounds  - Change in mental status (confusion or lethargy)  - New numbness or weakness    Please return if you have any other emergent concerns.  Additional Information:  Your vital signs today were: BP (!) 147/103 (BP Location: Right Arm)    Pulse 69    Temp 98.3 F (36.8 C) (Oral)    Resp 18    LMP 02/04/2016 (Exact Date)    SpO2 99%    Breastfeeding? Unknown  If your blood pressure (BP) was elevated above 135/85 this visit, please have this repeated by your doctor within one month. ---------------
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
## 2016-02-07 NOTE — ED Notes (Signed)
Declined W/C at D/C and was escorted to lobby by RN. 

## 2016-02-07 NOTE — ED Triage Notes (Signed)
Patient here with left wrist pain and swelling with deformity after falling yesterday, no loc, no other complaints. Positive distal pulses

## 2016-02-24 ENCOUNTER — Ambulatory Visit (INDEPENDENT_AMBULATORY_CARE_PROVIDER_SITE_OTHER): Payer: Medicaid Other | Admitting: Obstetrics and Gynecology

## 2016-02-24 VITALS — BP 139/91 | HR 71 | Wt 156.0 lb

## 2016-02-24 DIAGNOSIS — Z9889 Other specified postprocedural states: Secondary | ICD-10-CM

## 2016-02-24 NOTE — Progress Notes (Signed)
35 yo [id number] presenting today for post op check. Patient reports feeling well since her lsc left salpingectomy for the treatment of an ectopic pregnancy. She denies any fevers/chills, abnormal pelvic/abdominal pain or drainage from her incisions. This was a planned pregnancy  Past Medical History:  Diagnosis Date  . Abnormal Pap smear    Medication was treatment  . Exposure to STD 11/04/2011  . H/O candidiasis   . History of anemia 11/04/2011  . History of chlamydia 1998  . History of gonorrhea   . Hx of syphilis 1998  . Hx: UTI (urinary tract infection)   . Hypertension   . Migraines    TYPICALLY TAKES EXCEDERIN MIGRAINE  . Yeast infection    Past Surgical History:  Procedure Laterality Date  . CESAREAN SECTION  2003, 2004, 2008,2013   x 4  . CESAREAN SECTION  03/23/2012   Procedure: CESAREAN SECTION;  Surgeon: Kirkland HunArthur Stringer, MD;  Location: [facility];  Service: Obstetrics;  Laterality: N/A;  Repeat  . DIAGNOSTIC LAPAROSCOPY WITH REMOVAL OF ECTOPIC PREGNANCY Left 01/27/2016   Procedure: DIAGNOSTIC LAPAROSCOPY WITH REMOVAL OF LEFT ECTOPIC PREGNANCY;  Surgeon: Tilda BurrowJohn V Ferguson, MD;  Location: [facility];  Service: Gynecology;  Laterality: Left;   Family History  Problem Relation Age of Onset  . Diabetes Father   . Hypertension Father   . Hypertension Sister   . Diabetes Sister     ADULT ONSET  . Hypertension Mother   . Heart disease Mother   . Diabetes Mother   . Thyroid disease Maternal Uncle   . Heart disease Maternal Grandmother   . Hypertension Maternal Grandmother   . Diabetes Maternal Grandmother   . Diabetes Paternal Grandmother    Social History  Substance Use Topics  . Smoking status: Former Smoker    Packs/day: 1.00    Years: 15.00    Types: Cigarettes    Quit date: 01/03/2014  . Smokeless tobacco: Current User     Comment: PT COUNSELED TO TRY DECREASING AMOUT SHE SMOKES A DAY UNTIL SHE CAN EVENTUALLY QUIT  . Alcohol use No     Comment: "   ROS See pertinent in  HPI  Blood pressure (!) 139/91, pulse 71, weight 156 lb (70.8 kg), last menstrual period 02/04/2016, unknown if currently breastfeeding. GENERAL: Well-developed, well-nourished female in no acute distress.  ABDOMEN: Soft, nontender, nondistended.  Incision: no erythema, induration or drainage. All incision healed completely PELVIC: Normal external female genitalia. Vagina is pink and rugated.  Normal discharge. Normal appearing cervix. Uterus is normal in size. No adnexal mass or tenderness. EXTREMITIES: No cyanosis, clubbing, or edema, 2+ distal pulses.   A/P 35 yo here for post op check s/p LSC left salpingectomy for the treatment of an ectopic pregnancy  - Patient is medically cleared to resume all activities of daily living - patient plans to conceive again in the next few months - Advised to start taking prenatal vitamins - Follow up with PCP for HTN - RTC prn

## 2016-06-14 ENCOUNTER — Emergency Department (HOSPITAL_COMMUNITY)
Admission: EM | Admit: 2016-06-14 | Discharge: 2016-06-14 | Disposition: A | Discharge status: Home / Self Care | Payer: No Typology Code available for payment source | Attending: Emergency Medicine | Admitting: Emergency Medicine

## 2016-06-14 ENCOUNTER — Encounter (HOSPITAL_COMMUNITY): Payer: Self-pay | Admitting: Emergency Medicine

## 2016-06-14 DIAGNOSIS — Y939 Activity, unspecified: Secondary | ICD-10-CM | POA: Diagnosis not present

## 2016-06-14 DIAGNOSIS — M545 Low back pain: Secondary | ICD-10-CM | POA: Diagnosis not present

## 2016-06-14 DIAGNOSIS — I10 Essential (primary) hypertension: Secondary | ICD-10-CM | POA: Insufficient documentation

## 2016-06-14 DIAGNOSIS — Y999 Unspecified external cause status: Secondary | ICD-10-CM | POA: Diagnosis not present

## 2016-06-14 DIAGNOSIS — Y92410 Unspecified street and highway as the place of occurrence of the external cause: Secondary | ICD-10-CM | POA: Insufficient documentation

## 2016-06-14 DIAGNOSIS — Z87891 Personal history of nicotine dependence: Secondary | ICD-10-CM | POA: Insufficient documentation

## 2016-06-14 LAB — POC URINE PREG, ED: Preg Test, Ur: NEGATIVE

## 2016-06-14 MED ORDER — KETOROLAC TROMETHAMINE 60 MG/2ML IM SOLN
60.0000 mg | Freq: Once | INTRAMUSCULAR | Status: AC
  Administered 2016-06-14: 60 mg via INTRAMUSCULAR
  Filled 2016-06-14: qty 2

## 2016-06-14 MED ORDER — IBUPROFEN 800 MG PO TABS: 800.0000 mg | ORAL_TABLET | Freq: Three times a day (TID) | ORAL | 0 refills | Status: DC

## 2016-06-14 MED ORDER — METHOCARBAMOL 500 MG PO TABS: 500.0000 mg | ORAL_TABLET | Freq: Two times a day (BID) | ORAL | 0 refills | Status: DC

## 2016-06-14 MED ORDER — LIDOCAINE 5 % EX PTCH: 1.0000 | MEDICATED_PATCH | CUTANEOUS | 0 refills | Status: DC

## 2016-06-14 MED ORDER — METHOCARBAMOL 500 MG PO TABS
1000.0000 mg | ORAL_TABLET | Freq: Once | ORAL | Status: AC
  Administered 2016-06-14: 1000 mg via ORAL
  Filled 2016-06-14: qty 2
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
## 2016-06-14 NOTE — Discharge Instructions (Signed)
Expect your soreness to increase over the next 2-3 days. Take it easy, but do not lay around too much as this may make any stiffness worse.  °Antiinflammatory medications: Take 500 mg of naproxen every 12 hours or 800 mg of ibuprofen every 8 hours for the next 3 days. Take these medications with food to avoid upset stomach. Choose only one of these medications, do not take them together. ° °Muscle relaxer: Robaxin is a muscle relaxer and may help loosen stiff muscles. Do not take the Robaxin while driving or performing other dangerous activities.  ° °Lidocaine patches: These are available via either prescription or over-the-counter. The over-the-counter option may be more economical one and are likely just as effective. There are multiple over-the-counter brands, such as Salonpas. ° °Exercises: Be sure to perform the attached exercises starting with three times a week and working up to performing them daily. This is an essential part of preventing long term problems.  ° °Follow up with a primary care provider for any future management of these complaints. °

## 2016-06-14 NOTE — ED Notes (Signed)
Pt ambulatory and independent at discharge.  Verbalized understanding of discharge instructions 
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
## 2016-12-07 ENCOUNTER — Other Ambulatory Visit: Payer: Self-pay | Admitting: Family Medicine

## 2016-12-07 ENCOUNTER — Ambulatory Visit (INDEPENDENT_AMBULATORY_CARE_PROVIDER_SITE_OTHER): Payer: Medicaid Other | Admitting: Family Medicine

## 2016-12-07 VITALS — BP 118/68 | HR 72 | Temp 98.6°F | Ht 63.0 in | Wt 147.0 lb

## 2016-12-07 DIAGNOSIS — Z124 Encounter for screening for malignant neoplasm of cervix: Secondary | ICD-10-CM | POA: Diagnosis present

## 2016-12-07 DIAGNOSIS — Z720 Tobacco use: Secondary | ICD-10-CM

## 2016-12-07 DIAGNOSIS — Z8679 Personal history of other diseases of the circulatory system: Secondary | ICD-10-CM

## 2016-12-07 DIAGNOSIS — F322 Major depressive disorder, single episode, severe without psychotic features: Secondary | ICD-10-CM

## 2016-12-08 ENCOUNTER — Other Ambulatory Visit (HOSPITAL_COMMUNITY)
Admission: RE | Admit: 2016-12-08 | Discharge: 2016-12-08 | Disposition: A | Discharge status: Home / Self Care | Payer: Medicaid Other | Source: Ambulatory Visit | Attending: Family Medicine | Admitting: Family Medicine

## 2016-12-08 ENCOUNTER — Encounter: Payer: Self-pay | Admitting: Family Medicine

## 2016-12-08 DIAGNOSIS — Z124 Encounter for screening for malignant neoplasm of cervix: Secondary | ICD-10-CM | POA: Insufficient documentation

## 2016-12-08 NOTE — Progress Notes (Signed)
   CC: new patient  HPI HTN - patient reports she was found to have elevated BP at behavioral health admission. She reports she has not been on medicine for some time. BP normal today. Does not check her BP at home.   Depression - reports she is no longer depressed. She did have a psych admission in 2016 for suicidal ideation with overdose plan, brought on by the anniversary of her son's death at 3118 months old. She was discharged with trazodone QHS. Re;ports she has not had any medicines for mental health since that time.   Tobacco abuse - started at 36yo, about 1ppd now. No desire to quit at this time.   Social history:  Lives with: 3 children, 1 child is deceased.  Tobacco use: as above Alcohol use: occasional  Drug use: denies  ROS: denies CP, SOB, fever, rash, abd pain.   CC, SH/smoking status, and VS noted  Objective: BP 118/68   Pulse 72   Temp 98.6 F (37 C) (Oral)   Ht 5\' 3"  (1.6 m)   Wt 147 lb (66.7 kg)   LMP 12/01/2016 (Exact Date)   SpO2 97%   BMI 26.04 kg/m  Gen: NAD, alert, cooperative, and pleasant female.  HEENT: NCAT, EOMI, PERRL CV: RRR, no murmur Resp: CTAB, no wheezes, non-labored Abd: SNTND, BS present, no guarding or organomegaly GU: normal external female genitalia, cervix well visualized, no discharge, no lesions. Ext: No edema, warm Neuro: Alert and oriented, Speech clear, No gross deficits  Assessment and plan:  Tobacco abuse Patient pre contemplative today. Counseled that we have several resources and would like to help her quit if she wants to do this. RTC anytime she wants to discuss this further. Will continue to counsel regarding cessation.  History of hypertension Patient with history of HTN during acute psych encounter; possibly due to stress. BP normal today, will continue to monitor.   Major depressive disorder, single episode, severe without psychosis (HCC) Patient does not want medication or psych referral today. Suspect her history  of suicide attempt was related to acute grief. PHQ 9 4, GAD 7 6. Will continue to monitor.   Screening for STIs: patient would like both G/c off the pap as well as blood testing for HIV and RPR done today.   RPR positive - titer decreased from previously 1:4 and now 1:2. Called and discussed this with patient and let her know that no new infection was present.   Health Maintenance: PAP done today.   Loni MuseKate Coleton Woon, MD, PGY2 12/09/2016 10:29 AM

## 2016-12-09 DIAGNOSIS — Z8679 Personal history of other diseases of the circulatory system: Secondary | ICD-10-CM | POA: Insufficient documentation

## 2016-12-09 LAB — T PALLIDUM SCREENING CASCADE: T PALLIDUM ANTIBODIES (TP-PA): POSITIVE — AB

## 2016-12-09 LAB — RPR TITER (REFLEX): RPR: REACTIVE — AB

## 2016-12-09 LAB — HIV ANTIBODY (ROUTINE TESTING W REFLEX): HIV Screen 4th Generation wRfx: NONREACTIVE

## 2016-12-09 LAB — RPR, QUANT: RPR, Quant: 1:2 {titer} — ABNORMAL HIGH

## 2016-12-09 NOTE — Assessment & Plan Note (Signed)
Patient pre contemplative today. Counseled that we have several resources and would like to help her quit if she wants to do this. RTC anytime she wants to discuss this further. Will continue to counsel regarding cessation.

## 2016-12-09 NOTE — Assessment & Plan Note (Signed)
Patient does not want medication or psych referral today. Suspect her history of suicide attempt was related to acute grief. PHQ 9 4, GAD 7 6. Will continue to monitor.

## 2016-12-09 NOTE — Assessment & Plan Note (Signed)
Patient with history of HTN during acute psych encounter; possibly due to stress. BP normal today, will continue to monitor.

## 2016-12-09 NOTE — Patient Instructions (Signed)
No AVS given as Epic was down.

## 2016-12-12 LAB — CYTOLOGY - PAP
Adequacy: ABSENT
CHLAMYDIA, DNA PROBE: NEGATIVE
Diagnosis: NEGATIVE
HPV (WINDOPATH): NOT DETECTED
NEISSERIA GONORRHEA: NEGATIVE

## 2017-11-04 ENCOUNTER — Encounter

## 2018-05-02 ENCOUNTER — Inpatient Hospital Stay (HOSPITAL_COMMUNITY)
Admission: AD | Admit: 2018-05-02 | Discharge: 2018-05-02 | Disposition: A | Discharge status: Home / Self Care | Payer: BLUE CROSS/BLUE SHIELD | Source: Ambulatory Visit | Attending: Obstetrics and Gynecology | Admitting: Obstetrics and Gynecology

## 2018-05-02 ENCOUNTER — Encounter (HOSPITAL_COMMUNITY): Payer: Self-pay

## 2018-05-02 ENCOUNTER — Inpatient Hospital Stay (HOSPITAL_COMMUNITY): Payer: BLUE CROSS/BLUE SHIELD

## 2018-05-02 DIAGNOSIS — Z87891 Personal history of nicotine dependence: Secondary | ICD-10-CM | POA: Insufficient documentation

## 2018-05-02 DIAGNOSIS — R1030 Lower abdominal pain, unspecified: Secondary | ICD-10-CM | POA: Diagnosis present

## 2018-05-02 DIAGNOSIS — O00101 Right tubal pregnancy without intrauterine pregnancy: Secondary | ICD-10-CM | POA: Diagnosis not present

## 2018-05-02 DIAGNOSIS — Z3A08 8 weeks gestation of pregnancy: Secondary | ICD-10-CM | POA: Diagnosis not present

## 2018-05-02 DIAGNOSIS — O208 Other hemorrhage in early pregnancy: Secondary | ICD-10-CM

## 2018-05-02 DIAGNOSIS — O209 Hemorrhage in early pregnancy, unspecified: Secondary | ICD-10-CM

## 2018-05-02 DIAGNOSIS — Z6790 Unspecified blood type, Rh positive: Secondary | ICD-10-CM

## 2018-05-02 LAB — URINALYSIS, ROUTINE W REFLEX MICROSCOPIC
BILIRUBIN URINE: NEGATIVE
GLUCOSE, UA: NEGATIVE mg/dL
KETONES UR: 5 mg/dL — AB
Leukocytes, UA: NEGATIVE
Nitrite: NEGATIVE
PROTEIN: NEGATIVE mg/dL
Specific Gravity, Urine: 1.014 (ref 1.005–1.030)
pH: 5 (ref 5.0–8.0)

## 2018-05-02 LAB — COMPREHENSIVE METABOLIC PANEL
ALBUMIN: 4.2 g/dL (ref 3.5–5.0)
ALK PHOS: 42 U/L (ref 38–126)
ALT: 16 U/L (ref 0–44)
AST: 19 U/L (ref 15–41)
Anion gap: 8 (ref 5–15)
BUN: 9 mg/dL (ref 6–20)
CALCIUM: 9.2 mg/dL (ref 8.9–10.3)
CHLORIDE: 108 mmol/L (ref 98–111)
CO2: 23 mmol/L (ref 22–32)
CREATININE: 0.83 mg/dL (ref 0.44–1.00)
GFR calc Af Amer: >60 mL/min (ref >60)
GFR calc non Af Amer: >60 mL/min (ref >60)
GLUCOSE: 93 mg/dL (ref 70–99)
Potassium: 3.2 mmol/L — ABNORMAL LOW (ref 3.5–5.1)
SODIUM: 139 mmol/L (ref 135–145)
Total Bilirubin: 0.8 mg/dL (ref 0.3–1.2)
Total Protein: 7.1 g/dL (ref 6.5–8.1)

## 2018-05-02 LAB — CBC
HCT: 36.9 % (ref 36.0–46.0)
Hemoglobin: 12.7 g/dL (ref 12.0–15.0)
MCH: 35.2 pg — AB (ref 26.0–34.0)
MCHC: 34.4 g/dL (ref 30.0–36.0)
MCV: 102.2 fL — AB (ref 80.0–100.0)
NRBC: 0 % (ref 0.0–0.2)
PLATELETS: 203 10*3/uL (ref 150–400)
RBC: 3.61 MIL/uL — ABNORMAL LOW (ref 3.87–5.11)
RDW: 12.9 % (ref 11.5–15.5)
WBC: 10.9 10*3/uL — ABNORMAL HIGH (ref 4.0–10.5)

## 2018-05-02 LAB — HCG, QUANTITATIVE, PREGNANCY: hCG, Beta Chain, Quant, S: 6158 m[IU]/mL — ABNORMAL HIGH (ref <5)

## 2018-05-02 LAB — WET PREP, GENITAL
Clue Cells Wet Prep HPF POC: NONE SEEN
Sperm: NONE SEEN
Trich, Wet Prep: NONE SEEN
Yeast Wet Prep HPF POC: NONE SEEN

## 2018-05-02 LAB — POCT PREGNANCY, URINE: Preg Test, Ur: POSITIVE — AB

## 2018-05-02 MED ORDER — METHOTREXATE INJECTION FOR WOMEN'S HOSPITAL
50.0000 mg/m2 | Freq: Once | INTRAMUSCULAR | Status: AC
  Administered 2018-05-02: 85 mg via INTRAMUSCULAR
  Filled 2018-05-02: qty 1.7

## 2018-05-02 NOTE — MAU Note (Signed)
Vaginal bleeding since the 21st, was heavier but now it's lighter  Reports abdominal pain for 2 days  + UPT today  LMP 03/07/18

## 2018-05-02 NOTE — MAU Provider Note (Addendum)
History     CSN: [id number]  Arrival date and time: 05/02/18 1813   First Provider Initiated Contact with Patient 05/02/18 1927      Chief Complaint  Patient presents with  . Abdominal Pain  . Vaginal Bleeding   HPI Anita Fuentes is a 38 y.o. [phone] at 6713w0d who presents with vaginal bleeding and lower abdominal pain. She states she has been bleeding since 12/21 and is concerned about the continued bleeding. She also reports pain all along her lower abdomen that she rates a 4/10. She has a history of a left ruptured ectopic and feels like this pain is similar.   OB History    Gravida  6   Para  4   Term  4   Preterm  0   AB  1   Living  3     SAB  0   TAB  0   Ectopic  1   Multiple  0   Live Births  4           Past Medical History:  Diagnosis Date  . Abnormal Pap smear    Medication was treatment  . Exposure to STD 11/04/2011  . H/O candidiasis   . History of anemia 11/04/2011  . History of chlamydia 1998  . History of gonorrhea   . Hx of syphilis 1998  . Hx: UTI (urinary tract infection)   . Hypertension   . Migraines    TYPICALLY TAKES EXCEDERIN MIGRAINE  . Yeast infection     Past Surgical History:  Procedure Laterality Date  . CESAREAN SECTION  2003, 2004, 2008,2013   x 4  . CESAREAN SECTION  03/23/2012   Procedure: CESAREAN SECTION;  Surgeon: Kirkland HunArthur Stringer, MD;  Location: [facility];  Service: Obstetrics;  Laterality: N/A;  Repeat  . DIAGNOSTIC LAPAROSCOPY WITH REMOVAL OF ECTOPIC PREGNANCY Left 01/27/2016   Procedure: DIAGNOSTIC LAPAROSCOPY WITH REMOVAL OF LEFT ECTOPIC PREGNANCY;  Surgeon: Tilda BurrowJohn V Ferguson, MD;  Location: [facility];  Service: Gynecology;  Laterality: Left;    Family History  Problem Relation Age of Onset  . Diabetes Father   . Hypertension Father   . Hypertension Sister   . Diabetes Sister        ADULT ONSET  . Hypertension Mother   . Heart disease Mother   . Diabetes Mother   . Thyroid disease Maternal Uncle   . Heart  disease Maternal Grandmother   . Hypertension Maternal Grandmother   . Diabetes Maternal Grandmother   . Diabetes Paternal Grandmother     Social History   Tobacco Use  . Smoking status: Former Smoker    Packs/day: 1.00    Years: 15.00    Pack years: 15.00    Last attempt to quit: 01/03/2014    Years since quitting: 4.3  . Smokeless tobacco: Current User  Substance Use Topics  . Alcohol use: No    Comment: "  . Drug use: No    Allergies:  Allergies  Allergen Reactions  . Vicodin [Hydrocodone-Acetaminophen] Itching    No medications prior to admission.    Review of Systems  Constitutional: Negative.  Negative for fatigue and fever.  HENT: Negative.   Respiratory: Negative.  Negative for shortness of breath.   Cardiovascular: Negative.  Negative for chest pain.  Gastrointestinal: Positive for abdominal pain. Negative for constipation, diarrhea, nausea and vomiting.  Genitourinary: Positive for vaginal bleeding. Negative for dysuria.  Neurological: Negative.  Negative for dizziness and headaches.  Physical Exam   Blood pressure (!) 144/82, pulse 67, temperature 98 F (36.7 C), temperature source Oral, resp. rate 18, height 5\' 3"  (1.6 m), weight 66.3 kg, last menstrual period 03/07/2018, SpO2 99 %, unknown if currently breastfeeding.  Physical Exam  Nursing note and vitals reviewed. Constitutional: She is oriented to person, place, and time. She appears well-developed and well-nourished. No distress.  HENT:  Head: Normocephalic.  Eyes: Pupils are equal, round, and reactive to light.  Cardiovascular: Normal rate, regular rhythm and normal heart sounds.  Respiratory: Effort normal and breath sounds normal. No respiratory distress.  GI: Soft. Bowel sounds are normal. She exhibits no distension. There is no abdominal tenderness. There is no rebound and no guarding.  Neurological: She is alert and oriented to person, place, and time.  Skin: Skin is warm and dry.   Psychiatric: She has a normal mood and affect. Her behavior is normal. Judgment and thought content normal.    MAU Course  Procedures Results for orders placed or performed during the hospital encounter of 05/02/18 (from the past 24 hour(s))  Urinalysis, Routine w reflex microscopic     Status: Abnormal   Collection Time: 05/02/18  6:38 PM  Result Value Ref Range   Color, Urine YELLOW YELLOW   APPearance CLEAR CLEAR   Specific Gravity, Urine 1.014 1.005 - 1.030   pH 5.0 5.0 - 8.0   Glucose, UA NEGATIVE NEGATIVE mg/dL   Hgb urine dipstick LARGE (A) NEGATIVE   Bilirubin Urine NEGATIVE NEGATIVE   Ketones, ur 5 (A) NEGATIVE mg/dL   Protein, ur NEGATIVE NEGATIVE mg/dL   Nitrite NEGATIVE NEGATIVE   Leukocytes, UA NEGATIVE NEGATIVE   RBC / HPF >50 (H) 0 - 5 RBC/hpf   WBC, UA 21-50 0 - 5 WBC/hpf   Bacteria, UA RARE (A) NONE SEEN   Squamous Epithelial / LPF 0-5 0 - 5   Mucus PRESENT   Pregnancy, urine POC     Status: Abnormal   Collection Time: 05/02/18  6:40 PM  Result Value Ref Range   Preg Test, Ur POSITIVE (A) NEGATIVE  CBC     Status: Abnormal   Collection Time: 05/02/18  7:07 PM  Result Value Ref Range   WBC 10.9 (H) 4.0 - 10.5 K/uL   RBC 3.61 (L) 3.87 - 5.11 MIL/uL   Hemoglobin 12.7 12.0 - 15.0 g/dL   HCT 16.136.9 09.636.0 - 04.546.0 %   MCV 102.2 (H) 80.0 - 100.0 fL   MCH 35.2 (H) 26.0 - 34.0 pg   MCHC 34.4 30.0 - 36.0 g/dL   RDW 40.912.9 81.111.5 - 91.415.5 %   Platelets 203 150 - 400 K/uL   nRBC 0.0 0.0 - 0.2 %  hCG, quantitative, pregnancy     Status: Abnormal   Collection Time: 05/02/18  7:07 PM  Result Value Ref Range   hCG, Beta Chain, Quant, S 6,158 (H) <5 mIU/mL  Comprehensive metabolic panel     Status: Abnormal   Collection Time: 05/02/18  7:07 PM  Result Value Ref Range   Sodium 139 135 - 145 mmol/L   Potassium 3.2 (L) 3.5 - 5.1 mmol/L   Chloride 108 98 - 111 mmol/L   CO2 23 22 - 32 mmol/L   Glucose, Bld 93 70 - 99 mg/dL   BUN 9 6 - 20 mg/dL   Creatinine, Ser 7.820.83 0.44 -  1.00 mg/dL   Calcium 9.2 8.9 - 95.610.3 mg/dL   Total Protein 7.1 6.5 - 8.1 g/dL  Albumin 4.2 3.5 - 5.0 g/dL   AST 19 15 - 41 U/L   ALT 16 0 - 44 U/L   Alkaline Phosphatase 42 38 - 126 U/L   Total Bilirubin 0.8 0.3 - 1.2 mg/dL   GFR calc non Af Amer >60 >60 mL/min   GFR calc Af Amer >60 >60 mL/min   Anion gap 8 5 - 15  Wet prep, genital     Status: Abnormal   Collection Time: 05/02/18  8:59 PM  Result Value Ref Range   Yeast Wet Prep HPF POC NONE SEEN NONE SEEN   Trich, Wet Prep NONE SEEN NONE SEEN   Clue Cells Wet Prep HPF POC NONE SEEN NONE SEEN   WBC, Wet Prep HPF POC FEW (A) NONE SEEN   Sperm NONE SEEN    US Ob Less Than 14 Weeks With Ob Transvaginal  Result Date: 05/02/2018 CLINICAL DATA:  Vaginal bleeding, positive pregnancy test EXAM: OBSTETRIC <14 WK [place] AND TRANSVAGINAL OB US TECHNIQUE: Both transabdominal and transvaginal ultrasound examinations were performed for complete evaluation of the gestation as well as the maternal uterus, adnexal regions, and pelvic cul-de-sac. Transvaginal technique was performed to assess early pregnancy. COMPARISON:  None. FINDINGS: Intrauterine gestational sac: Not seen Yolk sac:  Not seen Embryo:  Not seen Maternal uterus/adnexae: Ovaries are within normal limits. Left ovary measures 2.9 x 2.1 x 2.7 cm. The right ovary measures 3.5 x 2 x 2.6 cm. Complex right adnexal mass, mostly echogenic with central hypo echoic area. This measures 2.4 x 1.4 by 1.6 cm and demonstrates peripheral vascularity. Small free fluid in the pelvis. Trace fluid in the endometrial canal. IMPRESSION: 1. No IUP identified, with additional finding of complex mass in the right adnexa measuring 2.4 cm. Findings are suspicious for ectopic pregnancy. 2. Trace free fluid in the pelvis. 3. Critical Value/emergent results were called by telephone at the time of interpretation on 05/02/2018 at 8:13 pm to Dr. Fabian November , who verbally acknowledged these results. Electronically Signed   By: Jasmine Pang M.D.   On: 05/02/2018 20:13   MDM UA, UPT CBC, HCG, ABO/Rh Wet prep and gc/chlamydia US OB Comp Less 14 weeks with Transvaginal  Care turned over to [facility]. Quanasia Defino at 2000. Rolm Bookbinder, CNM 05/03/18 7:43 PM Consult with Dr. Jolayne Panther, candidate for MTX. Discussed findings and recommendations with pt, she agrees to MTX. She desires a future pregnancy. Labs normal. MTX given. Discussed strict return precautions. Stable for discharge home.  Assessment and Plan   1. Right tubal pregnancy without intrauterine pregnancy   2. Vaginal bleeding affecting early pregnancy   3. Blood type, Rh positive    Discharge home Follow up in MAU in 4 days for [facility] Return precautions  Allergies as of 05/02/2018      Reactions   Vicodin [hydrocodone-acetaminophen] Itching      Medication List    STOP taking these medications   ibuprofen 800 MG tablet Commonly known as:  ADVIL,MOTRIN   lidocaine 5 % Commonly known as:  LIDODERM   MULTIVITAMIN PO       Donette Larry, CNM  05/03/2018 1:31 AM

## 2018-05-02 NOTE — Discharge Instructions (Signed)
Methotrexate Treatment for an Ectopic Pregnancy, Care After °This sheet gives you information about how to care for yourself after your procedure. Your health care provider may also give you more specific instructions. If you have problems or questions, contact your health care provider. °What can I expect after the procedure? °After the procedure, it is common to have: °· Abdominal cramping. °· Vaginal bleeding. °· Fatigue. °· Nausea. °· Vomiting. °· Diarrhea. °Blood tests will be taken at timed intervals for several days or weeks to check your pregnancy hormone levels. The blood tests will be done until the pregnancy hormone can no longer be detected in the blood. °Follow these instructions at home: °Activity °· Do not have sex until your health care provider approves. °· Limit activities that take a lot of effort as told by your health care provider. °Medicines °· Take over the counter and prescription medicines only as told by your health care provider. °· Do not take aspirin, ibuprofen, naproxen, or any other NSAIDs. °· Do not take folic acid, prenatal vitamins, or other vitamins that contain folic acid. °General instructions ° °· Do not drink alcohol. °· Follow instructions from your health care provider on how and when to report any symptoms that may indicate a ruptured ectopic pregnancy. °· Keep all follow-up visits as told by your health care provider. This is important. °Contact a health care provider if: °· You have persistent nausea and vomiting. °· You have persistent diarrhea. °· You are having a reaction to the medicine, such as: °? Tiredness. °? Skin rash. °? Hair loss. °Get help right away if: °· Your abdominal or pelvic pain gets worse. °· You have more vaginal bleeding. °· You feel light-headed or you faint. °· You have shortness of breath. °· Your heart rate increases. °· You develop a cough. °· You have chills. °· You have a fever. °Summary °· After the procedure, it is common to have symptoms  of abdominal cramping, vaginal bleeding and fatigue. You may also experience other symptoms. °· Blood tests will be taken at timed intervals for several days or weeks to check your pregnancy hormone levels. The blood tests will be done until the pregnancy hormone can no longer be detected in the blood. °· Limit strenuous activity as told by your health care provider. °· Follow instructions from your health care provider on how and when to report any symptoms that may indicate a ruptured ectopic pregnancy. °This information is not intended to replace advice given to you by your health care provider. Make sure you discuss any questions you have with your health care provider. °Document Released: 04/07/2011 Document Revised: 06/07/2016 Document Reviewed: 06/07/2016 °Elsevier Interactive Patient Education © 2019 [employer] ° °

## 2018-05-04 LAB — GC/CHLAMYDIA PROBE AMP (~~LOC~~) NOT AT ARMC
Chlamydia: NEGATIVE
Neisseria Gonorrhea: NEGATIVE

## 2018-05-06 ENCOUNTER — Inpatient Hospital Stay (HOSPITAL_COMMUNITY)
Admission: AD | Admit: 2018-05-06 | Discharge: 2018-05-06 | Disposition: A | Discharge status: Home / Self Care | Payer: BLUE CROSS/BLUE SHIELD | Source: Ambulatory Visit | Attending: Obstetrics and Gynecology | Admitting: Obstetrics and Gynecology

## 2018-05-06 DIAGNOSIS — Z3A08 8 weeks gestation of pregnancy: Secondary | ICD-10-CM

## 2018-05-06 DIAGNOSIS — O00101 Right tubal pregnancy without intrauterine pregnancy: Secondary | ICD-10-CM | POA: Diagnosis present

## 2018-05-06 LAB — HCG, QUANTITATIVE, PREGNANCY: hCG, Beta Chain, Quant, S: 9258 m[IU]/mL — ABNORMAL HIGH (ref <5)

## 2018-05-06 LAB — CBC
HCT: 34.4 % — ABNORMAL LOW (ref 36.0–46.0)
Hemoglobin: 11.8 g/dL — ABNORMAL LOW (ref 12.0–15.0)
MCH: 35.3 pg — AB (ref 26.0–34.0)
MCHC: 34.3 g/dL (ref 30.0–36.0)
MCV: 103 fL — ABNORMAL HIGH (ref 80.0–100.0)
Platelets: 192 10*3/uL (ref 150–400)
RBC: 3.34 MIL/uL — AB (ref 3.87–5.11)
RDW: 12.6 % (ref 11.5–15.5)
WBC: 6.5 10*3/uL (ref 4.0–10.5)
nRBC: 0 % (ref 0.0–0.2)

## 2018-05-06 NOTE — MAU Provider Note (Signed)
Anita Fuentes  is a 38 y.o. [id number] at 2555w4d s/p MTX for right ectopic pregnancy who presents to MAU today for follow-up quant hCG after 4 days. She denies pain or fever today. She is having a small amt of red VB. She chose MTX therapy as she would like to preserve future fertility and attempt conception again.   OB History  Gravida Para Term Preterm AB Living  6 4 4  0 1 3  SAB TAB Ectopic Multiple Live Births  0 0 1 0 4    # Outcome Date GA Lbr Len/2nd Weight Sex Delivery Anes PTL Lv  6 Current           5 Ectopic 2017          4 Term 03/23/12 728w0d  3465 g M CS-LTranv Spinal  DEC  3 Term 01/18/07 3428w0d  3118 g F CS-LTranv Spinal N LIV     Birth Comments: NO COMPLICATIONS  2 Term 04/14/03 8428w0d  2665 g M CS-LTranv Spinal N LIV     Birth Comments: NO COMPLICATIONS  1 Term 02/11/02 6928w0d 24:00 3090 g M CS-LTranv EPI N LIV     Birth Comments: FTP, increased BP    Past Medical History:  Diagnosis Date  . Abnormal Pap smear    Medication was treatment  . Exposure to STD 11/04/2011  . H/O candidiasis   . History of anemia 11/04/2011  . History of chlamydia 1998  . History of gonorrhea   . Hx of syphilis 1998  . Hx: UTI (urinary tract infection)   . Hypertension   . Migraines    TYPICALLY TAKES EXCEDERIN MIGRAINE  . Yeast infection     ROS: + VB no pain  BP 123/71 (BP Location: Right Arm)   Pulse 78   Temp 98.3 F (36.8 C) (Oral)   Resp 18   Ht 5\' 3"  (1.6 m)   Wt 68.7 kg   LMP 03/07/2018 (Approximate)   BMI 26.84 kg/m   CONSTITUTIONAL: Well-developed, well-nourished female in no acute distress.  MUSCULOSKELETAL: Normal range of motion.  CARDIOVASCULAR: Regular heart rate RESPIRATORY: Normal effort NEUROLOGICAL: Alert and oriented to person, place, and time.  SKIN: Not diaphoretic. No erythema. No pallor. PSYCH: Normal mood and affect. Normal behavior. Normal judgment and thought content.  Results for orders placed or performed during the hospital encounter of  05/06/18 (from the past 24 hour(s))  CBC     Status: Abnormal   Collection Time: 05/06/18 12:29 PM  Result Value Ref Range   WBC 6.5 4.0 - 10.5 K/uL   RBC 3.34 (L) 3.87 - 5.11 MIL/uL   Hemoglobin 11.8 (L) 12.0 - 15.0 g/dL   HCT 45.434.4 (L) 09.836.0 - 11.946.0 %   MCV 103.0 (H) 80.0 - 100.0 fL   MCH 35.3 (H) 26.0 - 34.0 pg   MCHC 34.3 30.0 - 36.0 g/dL   RDW 14.712.6 82.911.5 - 56.215.5 %   Platelets 192 150 - 400 K/uL   nRBC 0.0 0.0 - 0.2 %  hCG, quantitative, pregnancy     Status: Abnormal   Collection Time: 05/06/18 12:29 PM  Result Value Ref Range   hCG, Beta Chain, Quant, S 9,258 (H) <5 mIU/mL    MD[facility]: [facility] Consult with Dr. Alysia PennaErvin, could offer second dose of MTX today or [facility], but may still require second dose. Discussed options with pt, she prefers to wait until day 7. We  discussed strict return precautions. Stable for discharge home.   A: 1. Right tubal pregnancy without intrauterine pregnancy   S/p MTX therapy  P: Discharge home Ectopic precautions discussed Patient will return for follow-up quant HCG in [facility] on 05/09/18 @0830  Patient may return to MAU as needed or if her condition were to change or worsen   Donette Larry, [place] 05/06/2018 2:33 PM

## 2018-05-06 NOTE — MAU Note (Signed)
Here for follow up HCG post MTX  No pain  Having some light bleeding, states it is red. Was having some brown discharge when she was here before.

## 2018-05-09 ENCOUNTER — Telehealth: Payer: Self-pay | Admitting: Family Medicine

## 2018-05-09 ENCOUNTER — Other Ambulatory Visit: Payer: Self-pay

## 2018-05-09 NOTE — Telephone Encounter (Signed)
Pt called stating that she could not make her lab appt, check hcg lvls because she has court. She has no idea when she will be able to come to get her lvls checked again because they are going up and not down. Pt would like to get the situation handled though. Let patient know that she could return to MAU since she is unaware when she can make it back and they are open 24/7 the have a lab department where they can recheck her levels for her if that works better for her.

## 2018-05-11 ENCOUNTER — Emergency Department (HOSPITAL_COMMUNITY)
Admission: EM | Admit: 2018-05-11 | Discharge: 2018-05-12 | Disposition: A | Discharge status: Home / Self Care | Payer: BLUE CROSS/BLUE SHIELD | Attending: Emergency Medicine | Admitting: Emergency Medicine

## 2018-05-11 ENCOUNTER — Encounter (HOSPITAL_COMMUNITY): Payer: Self-pay | Admitting: *Deleted

## 2018-05-11 ENCOUNTER — Other Ambulatory Visit: Payer: Self-pay

## 2018-05-11 DIAGNOSIS — Z87891 Personal history of nicotine dependence: Secondary | ICD-10-CM | POA: Diagnosis not present

## 2018-05-11 DIAGNOSIS — O00101 Right tubal pregnancy without intrauterine pregnancy: Secondary | ICD-10-CM | POA: Insufficient documentation

## 2018-05-11 DIAGNOSIS — Z3A01 Less than 8 weeks gestation of pregnancy: Secondary | ICD-10-CM | POA: Insufficient documentation

## 2018-05-11 DIAGNOSIS — R1031 Right lower quadrant pain: Secondary | ICD-10-CM | POA: Diagnosis present

## 2018-05-11 DIAGNOSIS — I10 Essential (primary) hypertension: Secondary | ICD-10-CM | POA: Diagnosis not present

## 2018-05-11 LAB — BASIC METABOLIC PANEL
Anion gap: 9 (ref 5–15)
BUN: 8 mg/dL (ref 6–20)
CO2: 24 mmol/L (ref 22–32)
Calcium: 9.3 mg/dL (ref 8.9–10.3)
Chloride: 105 mmol/L (ref 98–111)
Creatinine, Ser: 0.95 mg/dL (ref 0.44–1.00)
GFR calc Af Amer: >60 mL/min (ref >60)
Glucose, Bld: 92 mg/dL (ref 70–99)
Potassium: 3.4 mmol/L — ABNORMAL LOW (ref 3.5–5.1)
Sodium: 138 mmol/L (ref 135–145)

## 2018-05-11 LAB — CBC
HCT: 34.7 % — ABNORMAL LOW (ref 36.0–46.0)
Hemoglobin: 11.6 g/dL — ABNORMAL LOW (ref 12.0–15.0)
MCH: 34.7 pg — ABNORMAL HIGH (ref 26.0–34.0)
MCHC: 33.4 g/dL (ref 30.0–36.0)
MCV: 103.9 fL — ABNORMAL HIGH (ref 80.0–100.0)
Platelets: 231 10*3/uL (ref 150–400)
RBC: 3.34 MIL/uL — ABNORMAL LOW (ref 3.87–5.11)
RDW: 12.5 % (ref 11.5–15.5)
WBC: 9.5 10*3/uL (ref 4.0–10.5)
nRBC: 0 % (ref 0.0–0.2)

## 2018-05-11 LAB — HCG, QUANTITATIVE, PREGNANCY: hCG, Beta Chain, Quant, S: 8518 m[IU]/mL — ABNORMAL HIGH (ref <5)

## 2018-05-11 NOTE — ED Triage Notes (Signed)
Pt has had L sided ectopic pregnancy in which she "had removed." Now reports R sided tubal pregnancy; pt was given methotrexate when seen at [facility] on Sunday, was supposed to follow up on Wednesday for hcg levels to be rechecked but she missed the appt. Pt c/o abd pain with mild vaginal bleeding

## 2018-05-12 ENCOUNTER — Emergency Department (HOSPITAL_COMMUNITY): Payer: BLUE CROSS/BLUE SHIELD

## 2018-05-12 LAB — AST: AST: 23 U/L (ref 15–41)

## 2018-05-12 MED ORDER — MORPHINE SULFATE (PF) 4 MG/ML IV SOLN
4.0000 mg | Freq: Once | INTRAVENOUS | Status: AC
  Administered 2018-05-12: 4 mg via INTRAVENOUS
  Filled 2018-05-12: qty 1

## 2018-05-12 MED ORDER — METHOTREXATE SODIUM CHEMO INJECTION 50 MG/2ML
50.0000 mg/m2 | Freq: Once | INTRAMUSCULAR | Status: DC
  Filled 2018-05-12: qty 3.5

## 2018-05-12 MED ORDER — METHOTREXATE INJECTION FOR WOMEN'S HOSPITAL
50.0000 mg/m2 | Freq: Once | INTRAMUSCULAR | Status: AC
  Administered 2018-05-12: 90 mg via INTRAMUSCULAR
  Filled 2018-05-12: qty 1.8

## 2018-05-12 NOTE — ED Provider Notes (Signed)
[facility] Provider Note   CSN: [id number] Arrival date & time: 05/11/18  2134     History   Chief Complaint Chief Complaint  Patient presents with  . Abdominal Pain    HPI Davis GourdSyretta M Johal is a 38 y.o. female.  The history is provided by the patient and medical records.     38 year old female with history of hypertension, migraine headaches, history of ectopic pregnancy, presenting to the ED with right lower abdominal pain.  [facility] due to vaginal bleeding.  She had hCG as well as ultrasound at that time that was concerning for possible ectopic pregnancy.  She was given methotrexate at that time.  On repeat visit on 05/06/2018, she had repeat hCG that was increasing.  She was scheduled to come back on 05/09/2018 for repeat quant and possible second dose of methotrexate, however she missed this appointment due to court.  States this morning she started having worsening pain in her right lower abdomen, extends down into the pelvis, right hip, and right lower back.  She has had some intermittent vaginal bleeding on and off during the day today.  She is not had any nausea or vomiting.  No fevers.  History of left ectopic pregnancy requiring salpingectomy in 2017.  Past Medical History:  Diagnosis Date  . Abnormal Pap smear    Medication was treatment  . Exposure to STD 11/04/2011  . H/O candidiasis   . History of anemia 11/04/2011  . History of chlamydia 1998  . History of gonorrhea   . Hx of syphilis 1998  . Hx: UTI (urinary tract infection)   . Hypertension   . Migraines    TYPICALLY TAKES EXCEDERIN MIGRAINE  . Yeast infection     Patient Active Problem List   Diagnosis Date Noted  . History of hypertension 12/09/2016  . 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 Suicide attempt by multiple drug overdose   . History of anemia 11/04/2011  . History of cesarean  section 09/05/2011  . History of RPR test 09/05/2011  . Tobacco abuse 09/05/2011    Past Surgical History:  Procedure Laterality Date  . CESAREAN SECTION  2003, 2004, 2008,2013   x 4  . CESAREAN SECTION  03/23/2012   Procedure: CESAREAN SECTION;  Surgeon: Kirkland HunArthur Stringer, MD;  Location: [facility];  Service: Obstetrics;  Laterality: N/A;  Repeat  . DIAGNOSTIC LAPAROSCOPY WITH REMOVAL OF ECTOPIC PREGNANCY Left 01/27/2016   Procedure: DIAGNOSTIC LAPAROSCOPY WITH REMOVAL OF LEFT ECTOPIC PREGNANCY;  Surgeon: Tilda BurrowJohn V Ferguson, MD;  Location: [facility];  Service: Gynecology;  Laterality: Left;     OB History    Gravida  6   Para  4   Term  4   Preterm  0   AB  1   Living  3     SAB  0   TAB  0   Ectopic  1   Multiple  0   Live Births  4            Home Medications    Prior to Admission medications   Not on File    Family History Family History  Problem Relation Age of Onset  . Diabetes Father   . Hypertension Father   . Hypertension Sister   . Diabetes Sister        ADULT ONSET  . Hypertension Mother   . Heart disease Mother   .  Diabetes Mother   . Thyroid disease Maternal Uncle   . Heart disease Maternal Grandmother   . Hypertension Maternal Grandmother   . Diabetes Maternal Grandmother   . Diabetes Paternal Grandmother     Social History Social History   Tobacco Use  . Smoking status: Former Smoker    Packs/day: 1.00    Years: 15.00    Pack years: 15.00    Last attempt to quit: 01/03/2014    Years since quitting: 4.3  . Smokeless tobacco: Current User  Substance Use Topics  . Alcohol use: No    Comment: "  . Drug use: No     Allergies   Vicodin [hydrocodone-acetaminophen]   Review of Systems Review of Systems  Gastrointestinal: Positive for abdominal pain.  Genitourinary: Positive for vaginal bleeding.  All other systems reviewed and are negative.    Physical Exam Updated Vital Signs BP 122/79   Pulse 90   Temp 98.3 F (36.8 C)    Resp 18   LMP 03/07/2018 (Approximate)   SpO2 99%   Physical Exam Vitals signs and nursing note reviewed.  Constitutional:      Appearance: She is well-developed.  HENT:     Head: Normocephalic and atraumatic.  Eyes:     Conjunctiva/sclera: Conjunctivae normal.     Pupils: Pupils are equal, round, and reactive to light.  Neck:     Musculoskeletal: Normal range of motion.  Cardiovascular:     Rate and Rhythm: Normal rate and regular rhythm.     Heart sounds: Normal heart sounds.  Pulmonary:     Effort: Pulmonary effort is normal.     Breath sounds: Normal breath sounds.  Abdominal:     General: Bowel sounds are normal.     Palpations: Abdomen is soft.     Tenderness: There is abdominal tenderness in the right lower quadrant.       Comments: RLQ tenderness as depicted, no peritoneal signs  Musculoskeletal: Normal range of motion.  Skin:    General: Skin is warm and dry.  Neurological:     Mental Status: She is alert and oriented to person, place, and time.      ED Treatments / Results  Labs (all labs ordered are listed, but only abnormal results are displayed) Labs Reviewed  CBC - Abnormal; Notable for the following components:      Result Value   RBC 3.34 (*)    Hemoglobin 11.6 (*)    HCT 34.7 (*)    MCV 103.9 (*)    MCH 34.7 (*)    All other components within normal limits  BASIC METABOLIC PANEL - Abnormal; Notable for the following components:   Potassium 3.4 (*)    All other components within normal limits  HCG, QUANTITATIVE, PREGNANCY - Abnormal; Notable for the following components:   hCG, Beta Chain, Quant, S 8,518 (*)    All other components within normal limits  AST    EKG None  Radiology US Ob Comp < 14 Wks  Result Date: 05/12/2018 CLINICAL DATA:  Follow-up ectopic, treated with methotrexate EXAM: OBSTETRIC <14 WK [place] AND TRANSVAGINAL OB US TECHNIQUE: Both transabdominal and transvaginal ultrasound examinations were performed for complete  evaluation of the gestation as well as the maternal uterus, adnexal regions, and pelvic cul-de-sac. Transvaginal technique was performed to assess early pregnancy. COMPARISON:  Ultrasound 05/02/2018 FINDINGS: Intrauterine gestational sac: Not visible Yolk sac:  Visible in the right adnexa Embryo:  Visible in the right adnexa CRL:  3.6  mm   6 w   0 d Subchorionic hemorrhage:  None visualized. Maternal uterus/adnexae: Within the right adnexa, immediately adjacent to the right ovary is an ectopic pregnancy. Now seen is internal yolk sac and small fetal pole but no cardiac activity. Findings have progressed since 05/02/2018. The right ovary measures 3.1 x 2.5 x 2.8 cm with a volume of 11 mL. The left ovary measures 2.4 x 1.8 x 1.8 cm with a volume of 4 mL. No significant free fluid. IMPRESSION: 1. Persistent ectopic pregnancy in the right adnexa, adjacent to the right ovary. Development of yolk sac and fetal pole but no fetal cardiac activity demonstrated on the current study. 2. No significant free fluid 3. Critical Value/emergent results were called by telephone at the time of interpretation on 05/12/2018 at 1:05 am to Dr. Pedro Earls [employer] , who verbally acknowledged these results. Electronically Signed   By: Jasmine Pang M.D.   On: 05/12/2018 01:05   US Ob Transvaginal  Result Date: 05/12/2018 CLINICAL DATA:  Follow-up ectopic, treated with methotrexate EXAM: OBSTETRIC <14 WK [place] AND TRANSVAGINAL OB US TECHNIQUE: Both transabdominal and transvaginal ultrasound examinations were performed for complete evaluation of the gestation as well as the maternal uterus, adnexal regions, and pelvic cul-de-sac. Transvaginal technique was performed to assess early pregnancy. COMPARISON:  Ultrasound 05/02/2018 FINDINGS: Intrauterine gestational sac: Not visible Yolk sac:  Visible in the right adnexa Embryo:  Visible in the right adnexa CRL:  3.6 mm   6 w   0 d Subchorionic hemorrhage:  None visualized. Maternal uterus/adnexae:  Within the right adnexa, immediately adjacent to the right ovary is an ectopic pregnancy. Now seen is internal yolk sac and small fetal pole but no cardiac activity. Findings have progressed since 05/02/2018. The right ovary measures 3.1 x 2.5 x 2.8 cm with a volume of 11 mL. The left ovary measures 2.4 x 1.8 x 1.8 cm with a volume of 4 mL. No significant free fluid. IMPRESSION: 1. Persistent ectopic pregnancy in the right adnexa, adjacent to the right ovary. Development of yolk sac and fetal pole but no fetal cardiac activity demonstrated on the current study. 2. No significant free fluid 3. Critical Value/emergent results were called by telephone at the time of interpretation on 05/12/2018 at 1:05 am to Dr. Pedro Earls [facility] , who verbally acknowledged these results. Electronically Signed   By: Jasmine Pang M.D.   On: 05/12/2018 01:05    Procedures Procedures (including critical care time)  CRITICAL CARE Performed by: Garlon Hatchet   Total critical care time: 35 minutes  Critical care time was exclusive of separately billable procedures and treating other patients.  Critical care was necessary to treat or prevent imminent or life-threatening deterioration.  Critical care was time spent personally by me on the following activities: development of treatment plan with patient and/or surrogate as well as nursing, discussions with consultants, evaluation of patient's response to treatment, examination of patient, obtaining history from patient or surrogate, ordering and performing treatments and interventions, ordering and review of laboratory studies, ordering and review of radiographic studies, pulse oximetry and re-evaluation of patient's condition.   Medications Ordered in ED Medications - No data to display   Initial Impression / Assessment and Plan / ED Course  I have reviewed the triage vital signs and the nursing notes.  Pertinent labs & imaging results that were available during my care  of the patient were reviewed by me and considered in my medical decision making (see chart for  details).  38 year old female here with right lower abdominal pain.  Found to have ectopic pregnancy on 05/02/2026 MAU.  Given dose of methotrexate and on subsequent visit hCG was rising.  She missed her follow-up after that as she was in court.  hCG today is decreasing from prior, however ultrasound with persistent right ectopic pregnancy, there is been development of fetal pole and yolk sac.  No visible cardiac activity.  Patient's labs overall unchanged from a few days ago.  hCG has decreased somewhat.  She is hemodynamically stable.  No active vaginal bleeding at this time.  Will discuss with OB/GYN for recommendations.  2:15 AM Discussed US findings with on call OB-GYN, Dr. Despina HiddenEure-- given that hcg is now down trending, recommend second dose of MTX here.  Will need follow-up for repeat hcg on Tuesday, 05/15/18.  Patient given dose of MTX here by myself.  She understands importance of close follow-up as instructed.  Can return here for any new/acute changes.  Final Clinical Impressions(s) / ED Diagnoses   Final diagnoses:  Right tubal pregnancy without intrauterine pregnancy    ED Discharge Orders    None       Garlon HatchetSanders, Nikodem Leadbetter M, PA-C 05/12/18 0734    Gilda CreasePollina, Christopher J, MD 05/13/18 [phone]

## 2018-05-12 NOTE — Discharge Instructions (Signed)
Follow-up with women's clinic or report to MAU on Tuesday, 05/15/17 for repeat hcg.  It is very important to make sure to go to this appointment. Return to the ED for new or worsening symptoms.

## 2018-05-12 NOTE — Progress Notes (Signed)
In Progress  05/12/18 7:26 AM [place], Virgel Paling, RN  IV team called by ED charge nurse to administer methotrexate for an ectopic pregnancy. Instructed the ED charge nurse to call [facility] hospital and have someone to come over to administer the medication. Current staffing not certified to administer.

## 2018-05-15 ENCOUNTER — Telehealth: Payer: Self-pay | Admitting: General Practice

## 2018-05-15 ENCOUNTER — Other Ambulatory Visit: Payer: BLUE CROSS/BLUE SHIELD

## 2018-05-15 DIAGNOSIS — O00101 Right tubal pregnancy without intrauterine pregnancy: Secondary | ICD-10-CM

## 2018-05-15 LAB — HCG, QUANTITATIVE, PREGNANCY: hCG, Beta Chain, Quant, S: 7770 m[IU]/mL — ABNORMAL HIGH (ref <5)

## 2018-05-15 NOTE — Telephone Encounter (Signed)
Called patient & informed her of results and discussed importance of following up on Friday for day #7 labs. Also discussed she will have to wait for results. Patient verbalized understanding & states she can be here at 8:15. Patient had no questions.

## 2018-05-15 NOTE — Progress Notes (Unsigned)
Patient was scheduled as routine bhcg today- upon chart review, patient should be stat bhcg as today is day #4 labs post second MTX dose on 1/11.  Reviewed results with Dr Debroah Loop who finds slight decrease in bhcg levels, patient should follow up on Friday 1/17 for day #7 labs. Will call patient with results/recommendations.

## 2018-05-18 ENCOUNTER — Ambulatory Visit (INDEPENDENT_AMBULATORY_CARE_PROVIDER_SITE_OTHER): Payer: BLUE CROSS/BLUE SHIELD

## 2018-05-18 DIAGNOSIS — O00101 Right tubal pregnancy without intrauterine pregnancy: Secondary | ICD-10-CM

## 2018-05-18 LAB — HCG, QUANTITATIVE, PREGNANCY: hCG, Beta Chain, Quant, S: 4846 m[IU]/mL — ABNORMAL HIGH (ref <5)

## 2018-05-18 NOTE — Progress Notes (Signed)
Pt here today for STAT beta lab s/p day 7 methotrexate tx r/t ectopic pregnancy.  Pt reports that she is having some RLQ pain 6/10 on pain scale but is tolerable.  Pt denies any bleeding.  Pt advised that she will be here for approximately two hours in which she will get results and f/u.  Pt agreed.    Notified Dr. Alysia Penna, pt's results and sx.  Provider recommendation to have weekly quants and precautions explained.  Informed pt of provider's recommendation and explained that if her pain increases or she begins to saturate a pad an hour to please go to MAU.  Pt stated understanding with no further questions.   Addison Naegeli, RN

## 2018-05-22 ENCOUNTER — Other Ambulatory Visit: Payer: Self-pay | Admitting: *Deleted

## 2018-05-22 DIAGNOSIS — O00101 Right tubal pregnancy without intrauterine pregnancy: Secondary | ICD-10-CM

## 2018-05-25 ENCOUNTER — Other Ambulatory Visit: Payer: BLUE CROSS/BLUE SHIELD

## 2018-05-25 DIAGNOSIS — O00101 Right tubal pregnancy without intrauterine pregnancy: Secondary | ICD-10-CM

## 2018-05-26 LAB — BETA HCG QUANT (REF LAB): hCG Quant: 806 m[IU]/mL

## 2018-05-28 ENCOUNTER — Telehealth: Payer: Self-pay | Admitting: *Deleted

## 2018-05-28 NOTE — Telephone Encounter (Addendum)
-----   Message from Hermina Staggers, MD sent at 05/28/2018  2:42 PM EST ----- BHCG in 1 week, not stat Thanks Gildardo Griffes  Called pt and informed her of BHCG level which is much lower than previous test on 1/17. She will need repeat lab draw on 1/30 or 1/31. Pt voiced understanding and agreed to appt on 1/30 @ 1100. Pt stated that she has not had much bleeding and is still having some discomfort - now 2-4/10. This is an improvement and pt was reassured her sx are normal.

## 2018-05-30 ENCOUNTER — Other Ambulatory Visit: Payer: Self-pay | Admitting: *Deleted

## 2018-05-30 DIAGNOSIS — O00101 Right tubal pregnancy without intrauterine pregnancy: Secondary | ICD-10-CM

## 2018-05-31 ENCOUNTER — Other Ambulatory Visit: Payer: BLUE CROSS/BLUE SHIELD

## 2018-05-31 DIAGNOSIS — O00101 Right tubal pregnancy without intrauterine pregnancy: Secondary | ICD-10-CM

## 2018-06-01 LAB — BETA HCG QUANT (REF LAB): HCG QUANT: 209 m[IU]/mL

## 2018-06-11 ENCOUNTER — Telehealth: Payer: Self-pay | Admitting: *Deleted

## 2018-06-11 DIAGNOSIS — O00101 Right tubal pregnancy without intrauterine pregnancy: Secondary | ICD-10-CM

## 2018-06-11 NOTE — Telephone Encounter (Signed)
-----   Message from Hermina Staggers, MD sent at 06/04/2018  9:26 AM EST ----- Weekly BHCG until value is 0 Pt needs f/u appt for MTX for ectopic Thanks Casimiro Needle

## 2018-06-11 NOTE — Telephone Encounter (Signed)
I called Anita Fuentes and notified her of bhcg results 05/31/18 and need for weekly until normal .she agreed to come tomorrow at 11:00 for non stat bhcg. . She voices understanding.

## 2018-06-12 ENCOUNTER — Other Ambulatory Visit: Payer: Self-pay

## 2019-03-06 ENCOUNTER — Encounter (HOSPITAL_COMMUNITY): Payer: Self-pay

## 2019-07-02 ENCOUNTER — Ambulatory Visit (INDEPENDENT_AMBULATORY_CARE_PROVIDER_SITE_OTHER): Payer: 59 | Admitting: Licensed Clinical Social Worker

## 2019-07-02 ENCOUNTER — Other Ambulatory Visit: Payer: Self-pay

## 2019-07-02 DIAGNOSIS — F4321 Adjustment disorder with depressed mood: Secondary | ICD-10-CM

## 2019-07-02 DIAGNOSIS — F1220 Cannabis dependence, uncomplicated: Secondary | ICD-10-CM
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
## 2019-08-06 ENCOUNTER — Ambulatory Visit (HOSPITAL_COMMUNITY): Payer: 59 | Admitting: Licensed Clinical Social Worker

## 2019-09-03 ENCOUNTER — Other Ambulatory Visit: Payer: Self-pay

## 2019-09-03 ENCOUNTER — Ambulatory Visit: Payer: 59 | Admitting: Family Medicine

## 2019-09-10 ENCOUNTER — Ambulatory Visit (INDEPENDENT_AMBULATORY_CARE_PROVIDER_SITE_OTHER): Payer: 59 | Admitting: Family Medicine

## 2019-09-10 ENCOUNTER — Other Ambulatory Visit: Payer: Self-pay

## 2019-09-10 ENCOUNTER — Encounter: Payer: Self-pay | Admitting: Family Medicine

## 2019-09-10 VITALS — BP 90/60 | HR 79 | Ht 63.0 in | Wt 150.0 lb

## 2019-09-10 DIAGNOSIS — Z862 Personal history of diseases of the blood and blood-forming organs and certain disorders involving the immune mechanism: Secondary | ICD-10-CM

## 2019-09-10 DIAGNOSIS — Z720 Tobacco use: Secondary | ICD-10-CM | POA: Diagnosis not present

## 2019-09-10 DIAGNOSIS — Z7689 Persons encountering health services in other specified circumstances: Secondary | ICD-10-CM | POA: Diagnosis not present

## 2019-09-10 DIAGNOSIS — Z8679 Personal history of other diseases of the circulatory system: Secondary | ICD-10-CM

## 2019-09-10 DIAGNOSIS — Z8349 Family history of other endocrine, nutritional and metabolic diseases: Secondary | ICD-10-CM

## 2019-09-10 DIAGNOSIS — F322 Major depressive disorder, single episode, severe without psychotic features: Secondary | ICD-10-CM

## 2019-09-10 NOTE — Patient Instructions (Signed)
It was very nice to meet you today. Please enjoy the rest of your week. Today you were seen to establish care. Follow up in 1 week for annual wellness visit or sooner if needed.   Please call the clinic at [phone] if your symptoms worsen or you have any concerns. It was our pleasure to serve you.  Psychology Today [email or web address]

## 2019-09-10 NOTE — Progress Notes (Signed)
    SUBJECTIVE:   CHIEF COMPLAINT / HPI:   Ms. Bost is a new patient presenting to establish care.   Hx of HTN Was diagnosed with high blood pressure postpartum but has not taken anti-hypertensive since November of last year. Outside of the clinic her last known BP was 127/87. Denies headaches, changes in vision. Endorses intermittent left chest pain. No active chest pain.  Tobacco abuse Smokes 1pk/day. Not ready to quit today.   Hx of suicide attempt/MDD Suicide attempt shortly after the death of her 76 month old son in 46. Took several Excedrin. Does not endorse suicidal ideation currently. States she has a strong support symptom. Not currently on medication or participating in regular counseling.  PERTINENT  PMH / PSH: Hx of anemia, hx of family thyroid disease, hx of C/S  OBJECTIVE:   BP 90/60   Pulse 79   Ht 5\' 3"  (1.6 m)   Wt 150 lb (68 kg)   LMP 08/22/2019   SpO2 97%   BMI 26.57 kg/m    General: Appears well, no acute distress. Age appropriate. Cardiac: RRR, normal heart sounds, no murmurs Respiratory: CTAB, normal effort Abdomen: soft, nontender, nondistended Extremities: No edema or cyanosis. Psych: normal affect   Office Visit from 09/10/2019 in [facility]  PHQ-2 Total Score  0     ASSESSMENT/PLAN:   History of hypertension Normotensive. No requirement for anti-hypertensive at this time.  -Continue to monitor BP with subsequent visits.   Tobacco abuse Patient not amenable to cessation today. Will assess at subsequent visits and give patient options to quit when ready. -Consider smoking cessation options at follow up visit -1-800-QUITNOW card given  Major depressive disorder, single episode, severe without psychosis (HCC) PHQ2 is 0 today. Patient endorses continued grief and depression on some days d/t the loss of her son. Therapy with the combination of medication will be beneficial if patient desires. -[email or web address]  resource given -Consider anti-depressant medication at follow up visit.   Encounter to establish care No current care gaps. Smoking cessation discussed. Therapy resource given for ongoing grief. Will need repeat PAP 2021 or 2023 depending on patient preference. CBC, CMP, lipid panel, TSH wnl. -Follow up in 1 week to discuss smoking cessation and to follow up on therapy search or other concerns -Schedule annual wellness exam as early as august if pap desired then    September, DO [facility]

## 2019-09-11 LAB — CBC
Hematocrit: 39 % (ref 34.0–46.6)
Hemoglobin: 13.4 g/dL (ref 11.1–15.9)
MCH: 34.3 pg — ABNORMAL HIGH (ref 26.6–33.0)
MCHC: 34.4 g/dL (ref 31.5–35.7)
MCV: 100 fL — ABNORMAL HIGH (ref 79–97)
Platelets: 187 10*3/uL (ref 150–450)
RBC: 3.91 x10E6/uL (ref 3.77–5.28)
RDW: 12.3 % (ref 11.7–15.4)
WBC: 8.8 10*3/uL (ref 3.4–10.8)

## 2019-09-11 LAB — COMPREHENSIVE METABOLIC PANEL
ALT: 12 IU/L (ref 0–32)
AST: 18 IU/L (ref 0–40)
Albumin/Globulin Ratio: 1.9 (ref 1.2–2.2)
Albumin: 4.6 g/dL (ref 3.8–4.8)
Alkaline Phosphatase: 62 IU/L (ref 39–117)
BUN/Creatinine Ratio: 14 (ref 9–23)
BUN: 13 mg/dL (ref 6–20)
Bilirubin Total: 0.6 mg/dL (ref 0.0–1.2)
CO2: 20 mmol/L (ref 20–29)
Calcium: 9.4 mg/dL (ref 8.7–10.2)
Chloride: 103 mmol/L (ref 96–106)
Creatinine, Ser: 0.94 mg/dL (ref 0.57–1.00)
GFR calc Af Amer: 89 mL/min/{1.73_m2} (ref >59)
GFR calc non Af Amer: 77 mL/min/{1.73_m2} (ref >59)
Globulin, Total: 2.4 g/dL (ref 1.5–4.5)
Glucose: 89 mg/dL (ref 65–99)
Potassium: 4.2 mmol/L (ref 3.5–5.2)
Sodium: 136 mmol/L (ref 134–144)
Total Protein: 7 g/dL (ref 6.0–8.5)

## 2019-09-11 LAB — LIPID PANEL
Chol/HDL Ratio: 2.5 ratio (ref 0.0–4.4)
Cholesterol, Total: 150 mg/dL (ref 100–199)
HDL: 61 mg/dL (ref >39)
LDL Chol Calc (NIH): 68 mg/dL (ref 0–99)
Triglycerides: 117 mg/dL (ref 0–149)
VLDL Cholesterol Cal: 21 mg/dL (ref 5–40)

## 2019-09-11 LAB — TSH: TSH: 1.15 u[IU]/mL (ref 0.450–4.500)

## 2019-09-16 DIAGNOSIS — Z7689 Persons encountering health services in other specified circumstances: Secondary | ICD-10-CM | POA: Insufficient documentation

## 2019-09-16 NOTE — Assessment & Plan Note (Signed)
PHQ2 is 0 today. Patient endorses continued grief and depression on some days d/t the loss of her son. Therapy with the combination of medication will be beneficial if patient desires. -[email or web address] resource given -Consider anti-depressant medication at follow up visit.

## 2019-09-16 NOTE — Assessment & Plan Note (Signed)
No current care gaps. Smoking cessation discussed. Therapy resource given for ongoing grief. Will need repeat PAP 2021 or 2023 depending on patient preference. CBC, CMP, lipid panel, TSH wnl. -Follow up in 1 week to discuss smoking cessation and to follow up on therapy search or other concerns -Schedule annual wellness exam as early as august if pap desired then

## 2019-09-16 NOTE — Assessment & Plan Note (Signed)
Patient not amenable to cessation today. Will assess at subsequent visits and give patient options to quit when ready. -Consider smoking cessation options at follow up visit -1-800-QUITNOW card given

## 2019-09-16 NOTE — Assessment & Plan Note (Signed)
Normotensive. No requirement for anti-hypertensive at this time.  -Continue to monitor BP with subsequent visits.

## 2019-09-17 ENCOUNTER — Other Ambulatory Visit: Payer: Self-pay

## 2019-09-17 ENCOUNTER — Ambulatory Visit (INDEPENDENT_AMBULATORY_CARE_PROVIDER_SITE_OTHER): Payer: 59 | Admitting: Family Medicine

## 2019-09-17 ENCOUNTER — Encounter: Payer: Self-pay | Admitting: Family Medicine

## 2019-09-17 VITALS — BP 92/58 | HR 82 | Ht 63.0 in | Wt 151.0 lb

## 2019-09-17 DIAGNOSIS — F322 Major depressive disorder, single episode, severe without psychotic features: Secondary | ICD-10-CM

## 2019-09-17 DIAGNOSIS — D7589 Other specified diseases of blood and blood-forming organs: Secondary | ICD-10-CM

## 2019-09-17 DIAGNOSIS — Z720 Tobacco use: Secondary | ICD-10-CM

## 2019-09-17 DIAGNOSIS — Z0000 Encounter for general adult medical examination without abnormal findings: Secondary | ICD-10-CM | POA: Diagnosis not present

## 2019-09-17 NOTE — Patient Instructions (Signed)
It was very nice to see you today. Please enjoy the rest of your week.  Today you were seen to review lab work and talk about smoking cessation  Follow up in 3 months for continued counseling on smoking cessation and for a pap.  Please visit [email or web address] for therapy options. We will talk about this in August  Please call the clinic at [phone] if your symptoms worsen or you have any concerns. It was our pleasure to serve you.

## 2019-09-23 DIAGNOSIS — D7589 Other specified diseases of blood and blood-forming organs: Secondary | ICD-10-CM | POA: Insufficient documentation

## 2019-09-23 DIAGNOSIS — Z124 Encounter for screening for malignant neoplasm of cervix: Secondary | ICD-10-CM | POA: Insufficient documentation

## 2019-09-23 NOTE — Progress Notes (Signed)
    SUBJECTIVE:   CHIEF COMPLAINT / HPI:   Anita Fuentes is a 39 yo F that presents for follow up on the below issues.   Smoking cessation counseling Smoke 1 pk/day. Admits to wanting to quit but she is not currently ready. Would like to pick her stop date in August. Will want assistance with nicotine patches.   Therapy for grief/MDD Our last visit patient was given psychology today resource to start therapy. She lost the handout but continues to be amenable to starting therapy.   PERTINENT  PMH / PSH: HTN, Abnormal pap (will repeat 12/2019), tobacco use disorder, Overdose suicide attempt linked to depression above due to death of her child (denies recent suicidal ideation; see 09/10/19 note), alcohol use disorder (2-3 shots of liquor weekly).   OBJECTIVE:   BP (!) 92/58 Comment: provider informed  Pulse 82   Ht 5\' 3"  (1.6 m)   Wt 151 lb (68.5 kg)   LMP 08/22/2019   SpO2 96%   BMI 26.75 kg/m    General: Appears well, no acute distress. Age appropriate. Cardiac: RRR, normal heart sounds, no murmurs Respiratory: CTAB, normal effort Psych: normal affect   Office Visit from 09/17/2019 in [facility]  PHQ-2 Total Score  0     ASSESSMENT/PLAN:   Tobacco abuse Will revisit in August for definite quit date. Not ready but desires near future cessation.   Major depressive disorder, single episode, severe without psychosis [facility]) Psychology resource given to initiate therapy. PHQ2 is 0 today.   Macrocytosis without anemia MCV 100. Hgb 13.4. Hx of macrocytic anemia and regular alcohol use. Will obtain b12 and folate lvl.   Healthcare maintenance Follow up in August for repeat Pap.   September, DO [facility]

## 2019-09-23 NOTE — Assessment & Plan Note (Signed)
Will revisit in August for definite quit date. Not ready but desires near future cessation.

## 2019-09-23 NOTE — Assessment & Plan Note (Signed)
Follow up in August for repeat Pap.

## 2019-09-23 NOTE — Assessment & Plan Note (Addendum)
MCV 100. Hgb 13.4. Hx of macrocytic anemia and regular alcohol use. Will obtain b12 and folate lvl.

## 2019-09-23 NOTE — Assessment & Plan Note (Signed)
Psychology resource given to initiate therapy. PHQ2 is 0 today.

## 2019-10-17 ENCOUNTER — Other Ambulatory Visit: Payer: Self-pay

## 2019-10-17 ENCOUNTER — Other Ambulatory Visit: Payer: 59

## 2019-10-17 DIAGNOSIS — D7589 Other specified diseases of blood and blood-forming organs: Secondary | ICD-10-CM

## 2019-10-18 ENCOUNTER — Encounter: Payer: Self-pay | Admitting: Family Medicine

## 2019-10-18 LAB — B12 AND FOLATE PANEL
Folate: 7.7 ng/mL (ref >3.0)
Vitamin B-12: 715 pg/mL (ref 232–1245)

## 2019-12-10 ENCOUNTER — Ambulatory Visit (INDEPENDENT_AMBULATORY_CARE_PROVIDER_SITE_OTHER): Payer: 59 | Admitting: Family Medicine

## 2019-12-10 ENCOUNTER — Other Ambulatory Visit: Payer: Self-pay

## 2019-12-10 ENCOUNTER — Other Ambulatory Visit (HOSPITAL_COMMUNITY)
Admission: RE | Admit: 2019-12-10 | Discharge: 2019-12-10 | Disposition: A | Discharge status: Home / Self Care | Payer: 59 | Source: Ambulatory Visit | Attending: Family Medicine | Admitting: Family Medicine

## 2019-12-10 VITALS — BP 134/74 | HR 63 | Wt 154.0 lb

## 2019-12-10 DIAGNOSIS — Z124 Encounter for screening for malignant neoplasm of cervix: Secondary | ICD-10-CM | POA: Insufficient documentation

## 2019-12-10 DIAGNOSIS — Z708 Other sex counseling: Secondary | ICD-10-CM | POA: Diagnosis not present

## 2019-12-10 DIAGNOSIS — Z720 Tobacco use: Secondary | ICD-10-CM | POA: Diagnosis not present

## 2019-12-10 NOTE — Progress Notes (Signed)
    SUBJECTIVE:   CHIEF COMPLAINT / HPI:   Pap Smear Last Pap smear 12/08/2016 NILM.  Prior paps: NILM LMP 11/29/2019 Contraception: None. Does not use condoms.   Sexually Active: Yes, 1 female sexual partner ready Desire for STD Screening: Yes Last mammogram: N/A will start @ 20yrs of age.  Concerns: None.  Tobacco use disorder Continues to smoke. Endorses increasing stressors. Voices she is not ready to quit but understands it would be more beneficial for her health.  PERTINENT  PMH / PSH: MDD, macrocytosis w/o anemia  OBJECTIVE:   BP 134/74   Pulse 63   Wt 154 lb (69.9 kg)   SpO2 98%   BMI 27.28 kg/m   General: Appears well, no acute distress. Age appropriate. Cardiac: RRR, normal heart sounds, no murmurs Respiratory: CTAB, normal effort Pelvic exam: VULVA: normal appearing vulva with no masses, tenderness or lesions, VAGINA: normal appearing vagina with normal color and discharge, no lesions, CERVIX: normal appearing cervix without discharge or lesions, PAP: Pap smear done today, DNA probe for chlamydia and GC obtained, HPV test, exam chaperoned by Maree Erie, CMA.  ASSESSMENT/PLAN:   Encounter for Pap smear of cervix with HPV DNA cotesting PAP+HPV done today. Results pending.  Encounter for sexually transmitted disease counseling STD testing today. Results pending. Patient aware of condom use as a means of protection from STDs.   Tobacco abuse Smoking cessation discussed. Resource for help with this given in AVS. Patient not ready to quit. Will continue to revisit subject matter with subsequent visits.    Lavonda Jumbo, DO [facility]

## 2019-12-10 NOTE — Patient Instructions (Addendum)
It was nice to see you again.  With abnormal results I will call you to discuss them.  Otherwise I will communicate with you via MyChart.  As always please consider smoking cessation.  For help dial 1-800-quit now.

## 2019-12-11 ENCOUNTER — Encounter: Payer: Self-pay | Admitting: Family Medicine

## 2019-12-11 DIAGNOSIS — Z708 Other sex counseling: Secondary | ICD-10-CM | POA: Insufficient documentation

## 2019-12-11 NOTE — Assessment & Plan Note (Addendum)
STD testing today. Results pending. Patient aware of condom use as a means of protection from STDs.

## 2019-12-11 NOTE — Assessment & Plan Note (Signed)
Smoking cessation discussed. Resource for help with this given in AVS. Patient not ready to quit. Will continue to revisit subject matter with subsequent visits.

## 2019-12-11 NOTE — Assessment & Plan Note (Signed)
PAP+HPV done today. Results pending.

## 2019-12-12 LAB — CYTOLOGY - PAP
Chlamydia: NEGATIVE
Comment: NEGATIVE
Comment: NEGATIVE
Comment: NEGATIVE
Comment: NORMAL
Diagnosis: NEGATIVE
High risk HPV: NEGATIVE
Neisseria Gonorrhea: NEGATIVE
Trichomonas: NEGATIVE

## 2019-12-12 LAB — HCV AB W REFLEX TO QUANT PCR: HCV Ab: <0.1 s/co ratio (ref 0.0–0.9)

## 2019-12-12 LAB — RPR, QUANT+TP ABS (REFLEX)
Rapid Plasma Reagin, Quant: 1:2 {titer} — ABNORMAL HIGH
T Pallidum Abs: REACTIVE — AB

## 2019-12-12 LAB — RPR: RPR Ser Ql: REACTIVE — AB

## 2019-12-12 LAB — HCV INTERPRETATION

## 2019-12-12 LAB — HIV ANTIBODY (ROUTINE TESTING W REFLEX): HIV Screen 4th Generation wRfx: NONREACTIVE

## 2020-01-15 ENCOUNTER — Other Ambulatory Visit: Payer: Self-pay

## 2020-01-15 ENCOUNTER — Ambulatory Visit (INDEPENDENT_AMBULATORY_CARE_PROVIDER_SITE_OTHER): Payer: 59 | Admitting: Family Medicine

## 2020-01-15 ENCOUNTER — Encounter: Payer: Self-pay | Admitting: Family Medicine

## 2020-01-15 VITALS — BP 112/64 | HR 65 | Ht 63.0 in | Wt 150.0 lb

## 2020-01-15 DIAGNOSIS — Z789 Other specified health status: Secondary | ICD-10-CM

## 2020-01-15 DIAGNOSIS — Z720 Tobacco use: Secondary | ICD-10-CM

## 2020-01-15 MED ORDER — NICOTINE 21 MG/24HR TD PT24: 21.0000 mg | MEDICATED_PATCH | Freq: Every day | TRANSDERMAL | 0 refills | Status: DC

## 2020-01-15 NOTE — Patient Instructions (Addendum)
It was wonderful to see you today.  Please bring ALL of your medications with you to every visit.   Today we talked about:  Trying to concieve: 1. Stop smoking: nicoderm patches sent to pharmacy we will do 21 mg for 6 weeks to start.  2. Referral to [facility]. They will call you to make an appoinment. If you do not hear from them in a week please let us know.   Please be sure to schedule follow up at the front  desk before you leave today.   Please call the clinic at [phone] if your symptoms worsen or you have any concerns. It was our pleasure to serve you.  Dr. Salvadore Dom

## 2020-01-15 NOTE — Progress Notes (Signed)
    SUBJECTIVE:   CHIEF COMPLAINT / HPI:   [name] is a 39 yo F who presents for the issues below.  Trying to conceive Attempting to have another child since her last child passed in 2015. Same father. Concerned she may not be able to get pregnant. Last two pregnancies were ectopic. 2017 pregnancy resulted in having the left fallopian tube removed. 2020 pregnancy resulted in being treated with methotrexate.   Tobacco use disorder Ready to quit smoking. Currently vaping and smoking 1 pk/day of cigarettes. Would like to try patches. Understands smoking can affect getting pregnant.   PERTINENT  PMH / PSH: Hx of major depressive disorder/Hx of suicide attempt (PHQ9 today score of 1).  OBJECTIVE:   BP 112/64   Pulse 65   Ht 5\' 3"  (1.6 m)   Wt 150 lb (68 kg)   SpO2 98%   BMI 26.57 kg/m   General: Appears well, no acute distress. Age appropriate. Cardiac: RRR, normal heart sounds, no murmurs Respiratory: CTAB, normal effort  ASSESSMENT/PLAN:   Attempting to conceive Attempting to concieve for 6 years with same partner and hx of 2 miscarriages. 1 resulting in losing the left fallopian tube. Known smoker and hx of STDs.  -Referral to infertility specialist -Counseled on smoking cessation as below  Tobacco abuse Desires to quit smoking. Attempting to get pregnant.  -Start nicotine patches -1800QUITNOW card given at previous visit   , DO [facility]

## 2020-01-23 NOTE — Assessment & Plan Note (Signed)
Desires to quit smoking. Attempting to get pregnant.  -Start nicotine patches -1800QUITNOW card given at previous visit

## 2020-01-23 NOTE — Assessment & Plan Note (Signed)
Attempting to concieve for 6 years with same partner and hx of 2 miscarriages. 1 resulting in losing the left fallopian tube. Known smoker and hx of STDs.  -Referral to infertility specialist -Counseled on smoking cessation as below

## 2020-09-18 ENCOUNTER — Encounter: Payer: Self-pay | Admitting: Family Medicine

## 2020-09-18 ENCOUNTER — Other Ambulatory Visit: Payer: Self-pay

## 2020-09-18 ENCOUNTER — Ambulatory Visit (INDEPENDENT_AMBULATORY_CARE_PROVIDER_SITE_OTHER): Payer: 59 | Admitting: Family Medicine

## 2020-09-18 VITALS — BP 130/86 | HR 74 | Wt 152.8 lb

## 2020-09-18 DIAGNOSIS — N3946 Mixed incontinence: Secondary | ICD-10-CM

## 2020-09-18 DIAGNOSIS — R109 Unspecified abdominal pain: Secondary | ICD-10-CM | POA: Diagnosis not present

## 2020-09-18 LAB — POCT URINALYSIS DIP (MANUAL ENTRY)
Bilirubin, UA: NEGATIVE
Blood, UA: NEGATIVE
Glucose, UA: NEGATIVE mg/dL
Leukocytes, UA: NEGATIVE
Nitrite, UA: NEGATIVE
Protein Ur, POC: NEGATIVE mg/dL
Spec Grav, UA: 1.025 (ref 1.010–1.025)
Urobilinogen, UA: 1 E.U./dL
pH, UA: 7 (ref 5.0–8.0)

## 2020-09-18 NOTE — Patient Instructions (Addendum)
It was wonderful to see you today.  Today we talked about:  Abdominal cramps I did not find anything concerning on exam today. This maybe a muscle spasm. Consider daily stretching.  Below I have given pelvic floor exercises. Stopping smoking will help with being able to hold your urine. Also consider scheduled bathroom breaks. Caffeine drinks can cause you to urinate more. Lets follow up in 1 month.   Please be sure to schedule follow up at the front  desk before you leave today.   If you haven't already, sign up for My Chart to have easy access to your labs results, and communication with your primary care physician.  Please call the clinic at [phone] if your symptoms worsen or you have any concerns. It was our pleasure to serve you.  Dr. Salvadore Dom   Kegel Exercises  Kegel exercises can help strengthen your pelvic floor muscles. The pelvic floor is a group of muscles that support your rectum, small intestine, and bladder. In females, pelvic floor muscles also help support the womb (uterus). These muscles help you control the flow of urine and stool. Kegel exercises are painless and simple, and they do not require any equipment. Your provider may suggest Kegel exercises to:  Improve bladder and bowel control.  Improve sexual response.  Improve weak pelvic floor muscles after surgery to remove the uterus (hysterectomy) or pregnancy (females).  Improve weak pelvic floor muscles after prostate gland removal or surgery (males). Kegel exercises involve squeezing your pelvic floor muscles, which are the same muscles you squeeze when you try to stop the flow of urine or keep from passing gas. The exercises can be done while sitting, standing, or lying down, but it is best to vary your position. Exercises How to do Kegel exercises: 1. Squeeze your pelvic floor muscles tight. You should feel a tight lift in your rectal area. If you are a female, you should also feel a tightness in your  vaginal area. Keep your stomach, buttocks, and legs relaxed. 2. Hold the muscles tight for up to 10 seconds. 3. Breathe normally. 4. Relax your muscles. 5. Repeat as told by your health care provider. Repeat this exercise daily as told by your health care provider. Continue to do this exercise for at least 4-6 weeks, or for as long as told by your health care provider. You may be referred to a physical therapist who can help you learn more about how to do Kegel exercises. Depending on your condition, your health care provider may recommend:  Varying how long you squeeze your muscles.  Doing several sets of exercises every day.  Doing exercises for several weeks.  Making Kegel exercises a part of your regular exercise routine. This information is not intended to replace advice given to you by your health care provider. Make sure you discuss any questions you have with your health care provider. Document Revised: 08/23/2019 Document Reviewed: 12/06/2017 Elsevier Patient Education  2021 [employer].

## 2020-09-18 NOTE — Progress Notes (Signed)
    SUBJECTIVE:   CHIEF COMPLAINT / HPI:   [name] is a 40 yo F who presents for the following:  Unable to hold bladder Main concern today. Ongoing for 2 years. Has incontinence with sneezing, coughing, laughing. Also has urgency. Pregnant 4x and all were by cesarean and one ectopic pregnancy. Will wear pads throughout the day. Denies burning with urination, abnormal urine odor or color. Denies saddle anesthesia. Endorse increased intake of water and green tea.    Stomach pain Intermittent for 2-3 years. Feels like knots or one big cramp in the lower abdomen. Resolves within minutes on its own. Worked up years prior thought to be related to scar tissue from C-SECTION. Denies issue with bowel or correlation with meals. Unsure if correlation with menstrual cycle. Denies nausea or vomiting.   PERTINENT  PMH / PSH: Tobacco use disorder (many attempts to quit have been unsuccessful; 20 pk/yr hx continue to smoke cigarettes)  OBJECTIVE:   BP 130/86   Pulse 74   Wt 152 lb 12.8 oz (69.3 kg)   LMP 08/31/2020   SpO2 98%   BMI 27.07 kg/m   General: Appears well, no acute distress. Age appropriate. Cardiac: RRR, normal heart sounds, no murmurs Respiratory: CTAB, normal effort Abdomen: soft, nontender, nondistended, NABS, no organomegaly. NO suprapubic tenderness Neuro: LE DTRs intact Psych: normal affect  Results for orders placed or performed in visit on 09/18/20 (from the past 48 hour(s))  POCT urinalysis dipstick     Status: Abnormal   Collection Time: 09/18/20 12:00 PM  Result Value Ref Range   Color, UA yellow yellow   Clarity, UA cloudy (A) clear   Glucose, UA negative negative mg/dL   Bilirubin, UA negative negative   Ketones, POC UA trace (5) (A) negative mg/dL   Spec Grav, UA 0.175 1.025 - 1.025   Blood, UA negative negative   pH, UA 7.0 5.0 - 8.0   Protein Ur, POC negative negative mg/dL   Urobilinogen, UA 1.0 0.2 or 1.0 E.U./dL   Nitrite, UA Negative Negative    Leukocytes, UA Negative Negative     ASSESSMENT/PLAN:   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 UA unremarkable to rule out UTI as possible cause. No red flags or acute symptoms such as saddle anesthesia. Likely pelvic floor stress pregnancies and gyn surgery.  -Bladder training with scheduled bathroom breaks -Kegel exercise handout -Decrease caffeine drinks -Consider smoking cessation -Consider PT/medication if no improvement -F/u in 1 month  Stomach pain Chronic for several years. Prior work up as stated by patient was found to be due to scar tissue. Intermittent and self resolves without correlation to meals or bowel movements. Least likely to be IBS but could benefit from symptom diary to clarify. Has regular periods and unsure if correlation to her menstrual cramps. No concerning exam findings; non tender in all abdominal quadrants at this time. Consider lower abdominal muscle spasms.  -Keep symptom diary -F/u 1 month to further clarify.      Anita Jumbo, DO Anita Fuentes

## 2020-09-18 NOTE — Progress Notes (Deleted)
    SUBJECTIVE:   CHIEF COMPLAINT / HPI:   ***  PERTINENT  PMH / PSH: ***  OBJECTIVE:   BP 130/86   Pulse 74   Wt 152 lb 12.8 oz (69.3 kg)   LMP 08/31/2020   SpO2 98%   BMI 27.07 kg/m   ***  ASSESSMENT/PLAN:   No problem-specific Assessment & Plan notes found for this encounter.     Lavonda Jumbo, DO [facility]   {    This will disappear when note is signed, click to select method of visit    :1}

## 2020-09-20 DIAGNOSIS — R109 Unspecified abdominal pain: Secondary | ICD-10-CM | POA: Insufficient documentation

## 2020-09-20 DIAGNOSIS — N3946 Mixed incontinence: Secondary | ICD-10-CM | POA: Insufficient documentation

## 2020-09-20 NOTE — Assessment & Plan Note (Signed)
Chronic. UA unremarkable to rule out UTI as possible cause. No red flags or acute symptoms such as saddle anesthesia. Likely pelvic floor stress pregnancies and gyn surgery.  -Bladder training with scheduled bathroom breaks -Kegel exercise handout -Decrease caffeine drinks -Consider smoking cessation -Consider PT/medication if no improvement -F/u in 1 month

## 2020-09-20 NOTE — Assessment & Plan Note (Signed)
Chronic for several years. Prior work up as stated by patient was found to be due to scar tissue. Intermittent and self resolves without correlation to meals or bowel movements. Least likely to be IBS but could benefit from symptom diary to clarify. Has regular periods and unsure if correlation to her menstrual cramps. No concerning exam findings; non tender in all abdominal quadrants at this time. Consider lower abdominal muscle spasms.  -Keep symptom diary -F/u 1 month to further clarify.

## 2020-10-20 ENCOUNTER — Ambulatory Visit: Payer: 59 | Admitting: Family Medicine

## 2020-11-06 ENCOUNTER — Encounter: Payer: Self-pay | Admitting: Family Medicine

## 2020-11-06 ENCOUNTER — Other Ambulatory Visit: Payer: Self-pay

## 2020-11-06 ENCOUNTER — Ambulatory Visit (INDEPENDENT_AMBULATORY_CARE_PROVIDER_SITE_OTHER): Payer: 59 | Admitting: Family Medicine

## 2020-11-06 VITALS — BP 141/93 | HR 63 | Wt 144.2 lb

## 2020-11-06 DIAGNOSIS — N3946 Mixed incontinence: Secondary | ICD-10-CM

## 2020-11-06 DIAGNOSIS — Z720 Tobacco use: Secondary | ICD-10-CM

## 2020-11-06 NOTE — Progress Notes (Deleted)
    SUBJECTIVE:   CHIEF COMPLAINT / HPI:   Follow up  PERTINENT  PMH / PSH:   OBJECTIVE:   There were no vitals taken for this visit.  ***  ASSESSMENT/PLAN:   No problem-specific Assessment & Plan notes found for this encounter.     Jamy Cleckler Autry-Lott, DO [facility]   {    This will disappear when note is signed, click to select method of visit    :1}  

## 2020-11-06 NOTE — Assessment & Plan Note (Addendum)
We discussed bladder irritants.  Recommended cessation of nicotine.  Referred to pelvic floor therapy to help with her symptoms.Discussed triggers and recommended reducing spicy and triggering foods.

## 2020-11-06 NOTE — Progress Notes (Signed)
    SUBJECTIVE:   CHIEF COMPLAINT: urinary symptoms  HPI:   Anita Fuentes is a 40 y.o. yo with history notable for 4 prior cesarean deliveries presenting for follow-up of urinary symptoms.  Patient reports her primary complaint is ongoing urinary frequency and incontinence.  She reports she often feels the urge to go to the bathroom and as soon as she feels the urge she needs to go to the bathroom she.  She often has small volume leakage when she coughs or sneezes.  This started after her last cesarean delivery she denies dysuria or hematuria.  She denies vaginal discharge or concern for sexually transmitted infection.  She drinks mostly water throughout the day.  This is not generally flavored.  She does drink green tea with watermelon intermittent Kool-Aid's and at times alcohol.  She has not had alcohol in the recent months.  She does not drink tea, coffee, sodas.  She does continue to smoke.  The patient reports her only stressor is that she is currently without a occupation.  She worked previously as a Lawyer.  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 Denies low mood, SI/HI.   PERTINENT  PMH / PSH/Family/Social History : Reviewed medical history notable for 4 prior C-sections, had a recent STI testing and Pap.  OBJECTIVE:   BP (!) 141/93   Pulse 63   Wt 144 lb 3.2 oz (65.4 kg)   SpO2 100%   BMI 25.54 kg/m   Today's weight:  Last Weight  Most recent update: 11/06/2020  4:33 PM    Weight  65.4 kg (144 lb 3.2 oz)            Review of prior weights: Filed Weights   11/06/20 1633  Weight: 144 lb 3.2 oz (65.4 kg)     Cardiac: Regular rate and rhythm. Normal S1/S2. No murmurs, rubs, or gallops appreciated. Lungs: Clear bilaterally to ascultation.  Abdomen: Normoactive bowel sounds. No tenderness to deep or light palpation. No rebound or guarding.  CS scar healing well, + small umbilical hernia  Psych: Pleasant and appropriate     ASSESSMENT/PLAN:   Tobacco abuse Recommended cessation to improve urinary symptoms.  Mixed stress and urge urinary incontinence   We discussed bladder irritants.  Recommended cessation of nicotine.  Referred to pelvic floor therapy to help with her symptoms.Discussed triggers and recommended reducing spicy and triggering foods.   History of mastocytosis, noted on previous labs consider repeat CBC with B12 in the future.  Elevated blood pressure reading, she has had several prior elevated blood pressure readings in the patient reports she required antihypertensive therapy during her pregnancy.  She denies chest pain dyspnea or other symptoms at this time recommend close follow-up and consideration of home blood pressure monitoring or initiation of a medication such as amlodipine 5 mg.  Patient declined COVID vaccine.    Terisa Starr, MD  Family Medicine Teaching Service  [facility]

## 2020-11-06 NOTE — Patient Instructions (Addendum)
It was wonderful to see you today.  Please bring ALL of your medications with you to every visit.   Today we talked about:   --Reducing green tea  - Cutting back on smoking  --You will be called about pelvic therapy    --Please follow up in 2-3 weeks for a blood pressure check   Please be sure to schedule follow up at the front  desk before you leave today.   Please call the clinic at [phone] if your symptoms worsen or you have any concerns. It was our pleasure to serve you.  Dr. Salvadore Dom   Stress can contribute to a lot of things--if you are ever interested in talking with a counselor, your bright health insurance will cover therapy. Go to [email or web address]

## 2020-11-06 NOTE — Assessment & Plan Note (Signed)
Recommended cessation to improve urinary symptoms.

## 2021-10-05 ENCOUNTER — Encounter: Payer: Self-pay | Admitting: *Deleted

## 2021-10-21 ENCOUNTER — Ambulatory Visit (HOSPITAL_COMMUNITY)
Admission: EM | Admit: 2021-10-21 | Discharge: 2021-10-21 | Disposition: A | Discharge status: Home / Self Care | Payer: Self-pay | Attending: Physician Assistant | Admitting: Physician Assistant

## 2021-10-21 ENCOUNTER — Ambulatory Visit (INDEPENDENT_AMBULATORY_CARE_PROVIDER_SITE_OTHER): Payer: Self-pay

## 2021-10-21 ENCOUNTER — Encounter (HOSPITAL_COMMUNITY): Payer: Self-pay

## 2021-10-21 DIAGNOSIS — S46812A Strain of other muscles, fascia and tendons at shoulder and upper arm level, left arm, initial encounter: Secondary | ICD-10-CM

## 2021-10-21 DIAGNOSIS — M25512 Pain in left shoulder: Secondary | ICD-10-CM

## 2021-10-21 MED ORDER — KETOROLAC TROMETHAMINE 30 MG/ML IJ SOLN
30.0000 mg | Freq: Once | INTRAMUSCULAR | Status: AC
  Administered 2021-10-21: 30 mg via INTRAMUSCULAR

## 2021-10-21 MED ORDER — METHOCARBAMOL 500 MG PO TABS: 500.0000 mg | ORAL_TABLET | Freq: Three times a day (TID) | ORAL | 0 refills | Status: DC | PRN

## 2021-10-21 MED ORDER — KETOROLAC TROMETHAMINE 30 MG/ML IJ SOLN
INTRAMUSCULAR | Status: AC
  Filled 2021-10-21: qty 1

## 2021-10-21 NOTE — ED Triage Notes (Signed)
C/o left shoulder pain radiating down to her left arm x 1 week. She reports doing a lot of lifting on her job.

## 2021-10-21 NOTE — ED Provider Notes (Signed)
[facility]    CSN: [id number] Arrival date & time: 10/21/21  3474      History   Chief Complaint Chief Complaint  Patient presents with   Shoulder Pain    HPI Anita Fuentes is a 41 y.o. female.   Patient presents today with a 1 week history of left shoulder pain.  Denies any known injury or increase in activity prior to symptom onset.  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  She reports that pain has gradually been worsening and is currently rated 10, localized to left shoulder with radiation into upper arm, described as throbbing, aggravating or alleviating factors identified.  She has tried [employer] patch as well as ibuprofen without improvement of symptoms.  Her last dose of ibuprofen was yesterday.  She denies previous injury or surgery involving her shoulder.  She is right-handed.  Denies any weakness, numbness, paresthesias of her hand.  She is confident that she is not pregnant.  She is having difficulty with daily duties as a result of symptoms.    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   Suicide attempt by multiple drug overdose (HCC)    Tobacco abuse 09/05/2011    Past Surgical History:  Procedure Laterality Date   CESAREAN SECTION  2003, 2004, [id number],6387   x 4   CESAREAN SECTION  03/23/2012    Procedure: CESAREAN SECTION;  Surgeon: Kirkland Hun, MD;  Location: [facility];  Service: Obstetrics;  Laterality: N/A;  Repeat   DIAGNOSTIC LAPAROSCOPY WITH REMOVAL OF ECTOPIC PREGNANCY Left 01/27/2016   Procedure: DIAGNOSTIC LAPAROSCOPY WITH REMOVAL OF LEFT ECTOPIC PREGNANCY;  Surgeon: Tilda Burrow, MD;  Location: [facility];  Service: Gynecology;  Laterality: Left;    OB History     Gravida  6   Para  4   Term  4   Preterm  0   AB  1   Living  3      SAB  0   IAB  0   Ectopic  1   Multiple  0   Live Births  4            Home Medications    Prior to Admission medications   Medication Sig Start Date End Date Taking? Authorizing Provider  methocarbamol (ROBAXIN) 500 MG tablet Take 1 tablet (500 mg total) by mouth every 8 (eight) hours as needed for muscle spasms. 10/21/21  Yes Maddalyn Lutze K, PA-C  nicotine (NICODERM CQ - DOSED IN MG/24 HOURS) 21 mg/24hr patch Place 1 patch (21 mg total) onto the skin daily. 01/15/20   Autry-Lott, Randa Evens, DO    Family History Family History  Problem Relation Age of Onset   Diabetes Father    Hypertension  Father    Hypertension Sister    Diabetes Sister        ADULT ONSET   Hypertension Mother    Heart disease Mother    Diabetes Mother    Thyroid disease Maternal Uncle    Heart disease Maternal Grandmother    Hypertension Maternal Grandmother    Diabetes Maternal Grandmother    Diabetes Paternal Grandmother     Social History Social History   Tobacco Use   Smoking status: Every Day    Packs/day: 1.00    Years: 15.00    Total pack years: 15.00    Types: Cigarettes   Smokeless tobacco: Current  Substance Use Topics   Alcohol use: No    Comment: "   Drug use: No     Allergies   Vicodin [hydrocodone-acetaminophen]   Review of Systems Review of Systems  Constitutional:  Positive for activity change. Negative for appetite change, fatigue and fever.  Gastrointestinal:  Negative for abdominal pain, diarrhea,  nausea and vomiting.  Musculoskeletal:  Positive for arthralgias. Negative for myalgias.  Neurological:  Negative for dizziness, weakness, light-headedness, numbness and headaches.     Physical Exam Triage Vital Signs ED Triage Vitals [10/21/21 1016]  Enc Vitals Group     BP (!) 148/92     Pulse Rate 81     Resp 18     Temp 98.2 F (36.8 C)     Temp Source Oral     SpO2 98 %     Weight      Height      Head Circumference      Peak Flow      Pain Score      Pain Loc      Pain Edu?      Excl. in GC?    No data found.  Updated Vital Signs BP (!) 148/92 (BP Location: Left Arm)   Pulse 81   Temp 98.2 F (36.8 C) (Oral)   Resp 18   LMP 09/30/2021   SpO2 98%   Visual Acuity Right Eye Distance:   Left Eye Distance:   Bilateral Distance:    Right Eye Near:   Left Eye Near:    Bilateral Near:     Physical Exam Vitals reviewed.  Constitutional:      General: She is awake. She is not in acute distress.    Appearance: Normal appearance. She is well-developed. She is not ill-appearing.     Comments: Very pleasant female appears stated age in no acute distress  HENT:     Head: Normocephalic and atraumatic.  Cardiovascular:     Rate and Rhythm: Normal rate and regular rhythm.     Heart sounds: Normal heart sounds, S1 normal and S2 normal. No murmur heard. Pulmonary:     Effort: Pulmonary effort is normal.     Breath sounds: Normal breath sounds. No wheezing, rhonchi or rales.     Comments: Clear to auscultation bilaterally Abdominal:     Palpations: Abdomen is soft.     Tenderness: There is no abdominal tenderness.  Musculoskeletal:     Left shoulder: Tenderness and bony tenderness present. No swelling. Decreased range of motion. Normal strength.     Comments: Left shoulder: Tenderness palpation over AC joint and along the scapular spine.  Normal active range of motion.  No deformity noted.  Strength 5/5 bilateral upper extremities.  Hand neurovascularly intact.   Psychiatric:        Behavior: Behavior is cooperative.  UC Treatments / Results  Labs (all labs ordered are listed, but only abnormal results are displayed) Labs Reviewed - No data to display  EKG   Radiology DG Shoulder Left  Result Date: 10/21/2021 CLINICAL DATA:  Pain.  Decreased range of motion. EXAM: LEFT SHOULDER - 2+ VIEW COMPARISON:  None Available. FINDINGS: There is no evidence of fracture or dislocation. There is no evidence of arthropathy or other focal bone abnormality. Soft tissues are unremarkable. IMPRESSION: Negative. Electronically Signed   By: Marin Roberts M.D.   On: 10/21/2021 11:28    Procedures Procedures (including critical care time)  Medications Ordered in UC Medications  ketorolac (TORADOL) 30 MG/ML injection 30 mg (30 mg Intramuscular Given 10/21/21 1108)    Initial Impression / Assessment and Plan / UC Course  I have reviewed the triage vital signs and the nursing notes.  Pertinent labs & imaging results that were available during my care of the patient were reviewed by me and considered in my medical decision making (see chart for details).     X-ray obtained given severity of pain showed no osseous abnormality.  Concern for trapezius strain given clinical presentation.  Patient was given Toradol in clinic with improvement of symptoms.  Discussed that she is not to take additional NSAIDs for 12 hours after injection but can use Tylenol for breakthrough pain.  She was prescribed Robaxin up to 3 times a day as needed.  Discussed that this is sedating and she should not drive or drink alcohol with taking it.  Can use over-the-counter medications as well as heat, rest, stretch for symptom relief.  She is to follow-up with sports medicine if symptoms or not improving was given contact information for local provider.  Discussed alarm symptoms that warrant emergent evaluation.  Strict return precautions given.  Work excuse note provided.  Final  Clinical Impressions(s) / UC Diagnoses   Final diagnoses:  Strain of left trapezius muscle, initial encounter  Acute pain of left shoulder     Discharge Instructions      Your x-ray was normal.  We gave an injection of Toradol today.  Please do not take NSAIDs including aspirin, ibuprofen/Advil, naproxen/Aleve for 12 hours from injection of medication.  You can use Tylenol for breakthrough pain.  Use Robaxin up to 3 times a day.  This will make you sleepy do not drive or drink alcohol with taking it.  You can continue with heat, rest, stretch.  If your symptoms do not improving I recommend you follow-up with sports medicine.  Please call to schedule an appointment.  If anything worsens return for reevaluation.     ED Prescriptions     Medication Sig Dispense Auth. Provider   methocarbamol (ROBAXIN) 500 MG tablet Take 1 tablet (500 mg total) by mouth every 8 (eight) hours as needed for muscle spasms. 30 tablet Delorse Shane, Noberto Retort, PA-C      PDMP not reviewed this encounter.   Jeani Hawking, PA-C 10/21/21 1149

## 2021-10-21 NOTE — Discharge Instructions (Signed)
Your x-ray was normal.  We gave an injection of Toradol today.  Please do not take NSAIDs including aspirin, ibuprofen/Advil, naproxen/Aleve for 12 hours from injection of medication.  You can use Tylenol for breakthrough pain.  Use Robaxin up to 3 times a day.  This will make you sleepy do not drive or drink alcohol with taking it.  You can continue with heat, rest, stretch.  If your symptoms do not improving I recommend you follow-up with sports medicine.  Please call to schedule an appointment.  If anything worsens return for reevaluation.

## 2021-12-28 ENCOUNTER — Telehealth: Payer: Self-pay | Admitting: Student

## 2021-12-28 ENCOUNTER — Ambulatory Visit (INDEPENDENT_AMBULATORY_CARE_PROVIDER_SITE_OTHER): Payer: Self-pay | Admitting: Student

## 2021-12-28 ENCOUNTER — Encounter: Payer: Self-pay | Admitting: Student

## 2021-12-28 VITALS — BP 145/95 | HR 60 | Ht 65.0 in | Wt 147.0 lb

## 2021-12-28 DIAGNOSIS — I10 Essential (primary) hypertension: Secondary | ICD-10-CM

## 2021-12-28 DIAGNOSIS — R079 Chest pain, unspecified: Secondary | ICD-10-CM | POA: Insufficient documentation

## 2021-12-28 DIAGNOSIS — K0889 Other specified disorders of teeth and supporting structures: Secondary | ICD-10-CM

## 2021-12-28 MED ORDER — AMLODIPINE BESYLATE 5 MG PO TABS: 5.0000 mg | ORAL_TABLET | Freq: Every day | ORAL | 0 refills | Status: DC

## 2021-12-28 NOTE — Assessment & Plan Note (Signed)
Patient complains of chest pain for years, that last for 1-2 seconds in chest, and does not radiate. Provider did not obtain EKG prior to patient leaving. Patient called and planning on returning for EKG tomorrow am.  -Precautions for emergency care given

## 2021-12-28 NOTE — Telephone Encounter (Signed)
Called and spoke with patient about coming in for AM appointment to discuss chest pain she's been having. Patient had visit today, but unfortunately due to volume of problems did not obtain EKG.   Patient available for f/u tomorrow am. Informed her that staff would reach out to her for an appointment.

## 2021-12-28 NOTE — Progress Notes (Cosign Needed Addendum)
  SUBJECTIVE:   CHIEF COMPLAINT / HPI:   Referral for dentist Has wisdom teeth that need to come out. Has teeth in all 4 upper and lower distal jaw spots. Says they are cracking, and she is feeling a lot of pain. Pain is worse  in top left tooth. Also thinks they might be causing migraines. Pain comes and goes, has been using bioteen mouthwash that helps. Patient reports having orange card in 2017 or 2018 for tooth surgery, missed the appointment. Denies any fevers, nausea, vomitting, reports some headaches and jaw pain.    HTN BP Readings from Last 3 Encounters:  12/28/21 (!) 145/95  10/21/21 (!) 148/92  11/06/20 (!) 141/93  BP on recheck 145/95  Chest Pain Been having for years, lasting 1-2 seconds, no nausea, SOB, or radiation. Last had pain on Sunday.    PERTINENT  PMH / PSH: Tobacco abuse   OBJECTIVE:  BP (!) 145/95   Pulse 60   Ht 5\' 5"  (1.651 m)   Wt 147 lb (66.7 kg)   SpO2 99%   BMI 24.46 kg/m   General: NAD, pleasant, able to participate in exam Cardiac: RRR, no murmurs rubs or gallops. Respiratory: CTAB, normal effort, no wheezes, rales or rhonchi Physical Exam HENT:     Mouth/Throat:     Lips: Pink. No lesions.     Mouth: Mucous membranes are moist. No oral lesions.     Dentition: Abnormal dentition. Dental caries (errupting tooth with cracked edges of third molar in upper and lower jaw bilaterally) present. No gingival swelling, dental abscesses or gum lesions.     Tongue: No lesions.     Pharynx: Oropharynx is clear.     ASSESSMENT/PLAN:  Pain in a tooth or teeth Patient complains of tooth pain with 3rd molars of upper and lower jaw. Patient has had issue since 2017 but has found them more bothersome since July. She denies any fevers or systemic symptoms, or foul order. Patient was looking for referral for oral surgeon. Informed patient she would have to find a dentist. Patient lacked signs or symptoms of dental abscess/infection, but appeared to have some  dental caries. No throat swelling or tonsilar hypertrophy/exudate, making pharyngeal abscess also unlikely.  -List of dentist in AVS  Chest pain Patient complains of chest pain for years, that last for 1-2 seconds in chest, and does not radiate. Provider did not obtain EKG prior to patient leaving. Patient called and planning on returning for EKG tomorrow am.  -Precautions for emergency care given  HTN (hypertension) Patient with elevated BP on previous appointments and repots some history of HTN in the past. BP elevated to 145/95 today.  -BMP -Amlodipine 5 mg    Patient had numerous other complaints that could not be addressed at this visit. Encouraged patient to schedule a follow up appointment.    Orders Placed This Encounter  Procedures   Basic Metabolic Panel   Meds ordered this encounter  Medications   amLODipine (NORVASC) 5 MG tablet    Sig: Take 1 tablet (5 mg total) by mouth daily.    Dispense:  30 tablet    Refill:  0   No follow-ups on file. @SIGNNOTE @

## 2021-12-28 NOTE — Assessment & Plan Note (Signed)
Patient with elevated BP on previous appointments and repots some history of HTN in the past. BP elevated to 145/95 today.  -BMP -Amlodipine 5 mg

## 2021-12-28 NOTE — Patient Instructions (Addendum)
It was great to see you! Thank you for allowing me to participate in your care!  I recommend that you always bring your medications to each appointment as this makes it easy to ensure we are on the correct medications and helps [place] not miss when refills are needed.  Our plans for today:  - Tooth pain  It looks like you have some cracked teeth that need to come out. You'll have to see a dentist to get a referral to an oral surgeon.   - High Blood Pressure  Your blood pressure has been high for the last couple of visits.   Start amlodipine 5 mg daily  - Follow up  Schedule an appointment for your other issues. We will get you seen!   Take care and seek immediate care sooner if you develop any concerns.   Dr. Bess Kinds, MD [facility]  Dental list         Updated 8.18.22 These dentists all accept Medicaid.  The list is a courtesy and for your convenience. Estos dentistas aceptan Medicaid.  La lista es para su [place] y es una cortesa.     [facility]     [phone] [address]  [address] [place] [address] Se habla espaol From 59 to 80 years old Parent may go with child only for cleaning Vinson Moselle DDS     [phone] Milus Banister, DDS (Spanish speaking) [address] [place]  [address] Se habla espaol New patients 8 and under, established until 18y.o Parent may go with child if needed  Marolyn Hammock DMD    [phone] [address] [place] [address] Se habla espaol [place] spoken From 65 years old Parent may go with child [facility]     [phone] [address] [place] [address] Se habla espaol, translation line, prefer for translator to be present  From 32 to 69 years old Ages 1-3y parents may go back 4+ go back by themselves parents can watch at "bay area"  [place] DDS  [phone] [facility]      [address]  [place] [address] Se habla  espaol [place] spoken (preferred to bring translator) From teeth coming in to 3 years old Parent may go with child  [facility]     [phone] [address] [place]. [place] [address] Requires certification. Call for information. Requiere certificacin. Llame para informacin. Algunos dias se habla espaol  From birth to 20 years Parent possibly goes with child   Bradd Canary DDS     [phone] [id number]  [address] [place] [address] Se habla espaol From 4 to 18 years  Parent may NOT go with child  [facility] DDS     Garlon Hatchet DDS  [phone] [address]  [place] [address] Se habla espaol- phone interpreters Ages 10 years and older Parent may go with child- 15+ go back alone   Melynda Ripple DDS    [phone] [address] [place] [address] Se habla espaol , 3 of their providers speak [place] From 18 months to 29 years old Parent may go with child [facility]  [phone] [address] [place] [address] Se habla espanol Interpretation for other languages Special needs children welcome Ages 57 and under  [facility]    [phone] [address] [place] [address] No se habla espaol From birth [facility]   [phone] Dr. Orlean Patten [address] [place], [place] [address] From birth to 59 y- new patients 10 and under Special needs children welcome   [facility] [place] [phone] Se habla espaol [address] [place], [place] [address]  6 month to 19 years  [facility] [place] [phone] [address] [place], [place] [address]  Se habla espaol 6 months and up, highest age is 16-17 for new patients, will see established patients until 40 y.o Parents may go back with child

## 2021-12-28 NOTE — Assessment & Plan Note (Addendum)
Patient complains of tooth pain with 3rd molars of upper and lower jaw. Patient has had issue since 2017 but has found them more bothersome since July. She denies any fevers or systemic symptoms, or foul order. Patient was looking for referral for oral surgeon. Informed patient she would have to find a dentist. Patient lacked signs or symptoms of dental abscess/infection, but appeared to have some dental caries. No throat swelling or tonsilar hypertrophy/exudate, making pharyngeal abscess also unlikely.  -List of dentist in AVS

## 2021-12-29 LAB — BASIC METABOLIC PANEL
BUN/Creatinine Ratio: 11 (ref 9–23)
BUN: 9 mg/dL (ref 6–24)
CO2: 22 mmol/L (ref 20–29)
Calcium: 9.8 mg/dL (ref 8.7–10.2)
Chloride: 104 mmol/L (ref 96–106)
Creatinine, Ser: 0.82 mg/dL (ref 0.57–1.00)
Glucose: 81 mg/dL (ref 70–99)
Potassium: 4.2 mmol/L (ref 3.5–5.2)
Sodium: 140 mmol/L (ref 134–144)
eGFR: 92 mL/min/{1.73_m2} (ref >59)

## 2021-12-31 ENCOUNTER — Ambulatory Visit (INDEPENDENT_AMBULATORY_CARE_PROVIDER_SITE_OTHER): Payer: Self-pay

## 2021-12-31 ENCOUNTER — Ambulatory Visit (HOSPITAL_COMMUNITY): Payer: Self-pay | Attending: Family Medicine

## 2021-12-31 DIAGNOSIS — R9431 Abnormal electrocardiogram [ECG] [EKG]: Secondary | ICD-10-CM | POA: Insufficient documentation

## 2021-12-31 DIAGNOSIS — R079 Chest pain, unspecified: Secondary | ICD-10-CM | POA: Insufficient documentation

## 2021-12-31 NOTE — Progress Notes (Signed)
Patient presents to clinic for EKG, per Dr. Barbaraann Faster.   Patient reports that she is not currently having chest pain.   EKG obtained. Reviewed by Dr. Leveda Anna and [place].   No new orders per Dr. Barbaraann Faster. Patient instructed to schedule follow up visit at front desk.   Veronda Prude, RN

## 2022-01-03 NOTE — Patient Instructions (Signed)
It was wonderful to see you today.  Please bring ALL of your medications with you to every visit.   Today we talked about:  We are doing lab work today to check your cholesterol. I will send you a MyChart message if you have MyChart. Otherwise, I will give you a call for abnormal results or send a letter if everything returned back normal. If you don't hear from me in 2 weeks, please call the office.    -We are doing screening for sexually transmitted infections. -I recommend you continue taking prenatals. -I have sent in Chantix and Nicotine patches. You have an appointment with our pharmacist, Dr. Raymondo Band to discuss more.   Today at your annual preventive visit we talked about the following measures:  I recommend 150 minutes of exercise per week-try 30 minutes 5 days per week We discussed reducing sugary beverages (like soda and juice) and increasing leafy greens and whole fruits.  We discussed avoiding tobacco and alcohol.  I recommend avoiding illicit substances.  Your blood pressure was elevated today. Please pick up your prescription at the pharmacy.   Thank you for choosing [facility].   Please call [phone] with any questions about today's appointment.  Please be sure to schedule follow up at the front  desk before you leave today.   Sabino Dick, DO PGY-3 Family Medicine

## 2022-01-03 NOTE — Progress Notes (Signed)
SUBJECTIVE:   Chief compliant/HPI: annual examination  Anita Fuentes is a 41 y.o. who presents today for an annual exam.   Current concerns:  Wanting to have another baby. Wondering what her chances are of this.  She tells me she has a history of ectopic pregnancies x2, had her left fallopian tube removed due to this.  She still has her right ovary and fallopian tube.  She is taking prenatal vitamins.  Hypertension Has not picked up her Amlodipine since last visit.   PSHx: left tube removed due to ectopic.  Social Hx: Smoking since age 49, smoking 1ppd. Interested in quitting! Drinks alcohol, 2-3 12 oz beers twice a week.   History tabs reviewed and updated.    OBJECTIVE:   BP (!) 145/93   Pulse (!) 58   Ht $R'5\' 5"'gg$  (1.651 m)   Wt 144 lb 3.2 oz (65.4 kg)   LMP 12/08/2021 (Approximate)   SpO2 100%   BMI 24.00 kg/m    General: Awake, alert, oriented, in no acute distress, pleasant and cooperative with examination, smells of tobacco  HEENT: Normocephalic, atraumatic, nares patent, dentition is fair, oropharynx without erythema or exudates, no thyroid nodules palpated Cardio: RRR without murmur Respiratory: CTAB without wheezing/rhonchi/rales Abdomen: Soft, non-tender to palpation of all quadrants, non-distended, no rebound/guarding Extremities: without edema or cyanosis Neuro: Speech is clear and intact, no focal deficits, no facial asymmetry, follows commands  Psych: Normal mood and affect     01/04/2022   11:07 AM 12/28/2021    3:20 PM 11/06/2020    4:34 PM  Depression screen PHQ 2/9  Decreased Interest 0 0 0  Down, Depressed, Hopeless 0 0 1  PHQ - 2 Score 0 0 1  Altered sleeping 1 0 0  Tired, decreased energy 1 1 0  Change in appetite 0 1 1  Feeling bad or failure about yourself  0 0 0  Trouble concentrating 0 0 0  Moving slowly or fidgety/restless 0 0 0  Suicidal thoughts 0 0 0  PHQ-9 Score $RemoveBef'2 2 2  'PsmYMLhtDX$ Difficult doing work/chores Not difficult at all Not difficult at  all      ASSESSMENT/PLAN:   HTN (hypertension) Blood pressure elevated today.  Encouraged her to pick up her medications. -Consider switch to Nifedipine or Labetalol  -F/u with PCP in 1 month for BP check -Encouraged smoking cessation   Annual Examination  See AVS for age appropriate recommendations.   PHQ score 2, reviewed and discussed.  Blood pressure reviewed and not goal.  Asked about intimate partner violence and resources given as appropriate  The patient currently uses nothing for contraception. Folate recommended as appropriate, minimum of 400 mcg per day.   Considered the following items based upon USPSTF recommendations: Diabetes screening:  not ordered Screening for elevated cholesterol: discussed and ordered HIV testing:  previously tested and negative  Elected for re-testing today. Hepatitis C:  previously tested and negative . Elected for re-testing today. Hepatitis B: discussed and ordered Syphilis if at high risk: discussed and ordered GC/CT  ordered Reviewed risk factors for latent tuberculosis and not indicated  Discussed family history, BRCA testing not indicated. Tool used to risk stratify was Pedigree Assessment Tool.  Cervical cancer screening: prior Pap reviewed, repeat due in 11/2024 Breast cancer screening: discussed potential benefits, risks including overdiagnosis and biopsy, elected to wait until age 9 Colorectal cancer screening: not applicable given age.  if age 20 or over.   Follow up in 1 month  for BP check.   Sharion Settler, [place]

## 2022-01-04 ENCOUNTER — Ambulatory Visit (INDEPENDENT_AMBULATORY_CARE_PROVIDER_SITE_OTHER): Payer: Self-pay | Admitting: Family Medicine

## 2022-01-04 ENCOUNTER — Other Ambulatory Visit (HOSPITAL_COMMUNITY)
Admission: RE | Admit: 2022-01-04 | Discharge: 2022-01-04 | Disposition: A | Discharge status: Home / Self Care | Payer: Self-pay | Source: Ambulatory Visit | Attending: Family Medicine | Admitting: Family Medicine

## 2022-01-04 ENCOUNTER — Encounter: Payer: Self-pay | Admitting: Family Medicine

## 2022-01-04 VITALS — BP 145/93 | HR 58 | Ht 65.0 in | Wt 144.2 lb

## 2022-01-04 DIAGNOSIS — Z113 Encounter for screening for infections with a predominantly sexual mode of transmission: Secondary | ICD-10-CM

## 2022-01-04 DIAGNOSIS — Z1159 Encounter for screening for other viral diseases: Secondary | ICD-10-CM

## 2022-01-04 DIAGNOSIS — Z0000 Encounter for general adult medical examination without abnormal findings: Secondary | ICD-10-CM

## 2022-01-04 DIAGNOSIS — I10 Essential (primary) hypertension: Secondary | ICD-10-CM

## 2022-01-04 DIAGNOSIS — Z13220 Encounter for screening for lipoid disorders: Secondary | ICD-10-CM

## 2022-01-04 LAB — POCT WET PREP (WET MOUNT)
Clue Cells Wet Prep Whiff POC: NEGATIVE
Trichomonas Wet Prep HPF POC: ABSENT
WBC, Wet Prep HPF POC: NONE SEEN

## 2022-01-04 MED ORDER — NICOTINE 21 MG/24HR TD PT24: 21.0000 mg | MEDICATED_PATCH | Freq: Every day | TRANSDERMAL | 0 refills | Status: DC

## 2022-01-04 MED ORDER — VARENICLINE TARTRATE 0.5 MG PO TABS: ORAL_TABLET | ORAL | 0 refills | Status: DC

## 2022-01-04 NOTE — Assessment & Plan Note (Signed)
Blood pressure elevated today.  Encouraged her to pick up her medications. -Consider switch to Nifedipine or Labetalol  -F/u with PCP in 1 month for BP check -Encouraged smoking cessation

## 2022-01-06 LAB — HIV ANTIBODY (ROUTINE TESTING W REFLEX): HIV Screen 4th Generation wRfx: NONREACTIVE

## 2022-01-06 LAB — HCV INTERPRETATION

## 2022-01-06 LAB — LIPID PANEL
Chol/HDL Ratio: 2.4 ratio (ref 0.0–4.4)
Cholesterol, Total: 137 mg/dL (ref 100–199)
HDL: 58 mg/dL (ref >39)
LDL Chol Calc (NIH): 65 mg/dL (ref 0–99)
Triglycerides: 66 mg/dL (ref 0–149)
VLDL Cholesterol Cal: 14 mg/dL (ref 5–40)

## 2022-01-06 LAB — HEPATITIS B SURFACE ANTIGEN: Hepatitis B Surface Ag: NEGATIVE

## 2022-01-06 LAB — RPR, QUANT+TP ABS (REFLEX)
Rapid Plasma Reagin, Quant: 1:4 {titer} — ABNORMAL HIGH
T Pallidum Abs: REACTIVE — AB

## 2022-01-06 LAB — HCV AB W REFLEX TO QUANT PCR: HCV Ab: NONREACTIVE

## 2022-01-06 LAB — RPR: RPR Ser Ql: REACTIVE — AB

## 2022-01-11 ENCOUNTER — Ambulatory Visit: Payer: Self-pay | Admitting: Pharmacist

## 2022-01-11 LAB — CERVICOVAGINAL ANCILLARY ONLY
Chlamydia: NEGATIVE
Comment: NEGATIVE

## 2022-02-02 ENCOUNTER — Other Ambulatory Visit: Payer: Self-pay | Admitting: Student

## 2022-03-16 ENCOUNTER — Telehealth: Payer: Self-pay | Admitting: *Deleted

## 2022-03-16 DIAGNOSIS — K089 Disorder of teeth and supporting structures, unspecified: Secondary | ICD-10-CM

## 2022-03-16 NOTE — Telephone Encounter (Signed)
Patient has orange card is needing to be referral to their dental clinic urgently for oral pain.  She has contacted [employer] and they sent me an email about her.  Please place referral for dentist and I will fill out the form and send it to them.  Thanks [employer]

## 2022-03-16 NOTE — Telephone Encounter (Signed)
Referral placed.   Nijah Tejera, MD  Family Medicine Teaching Service   

## 2022-09-23 IMAGING — DX DG SHOULDER 2+V*L*
4 series · 4 of 4 positions shown · non-contrast
Comparison: None Available.

CLINICAL DATA: Pain.  Decreased range of motion.

EXAM:
LEFT SHOULDER - 2+ VIEW

[shoulder ap]
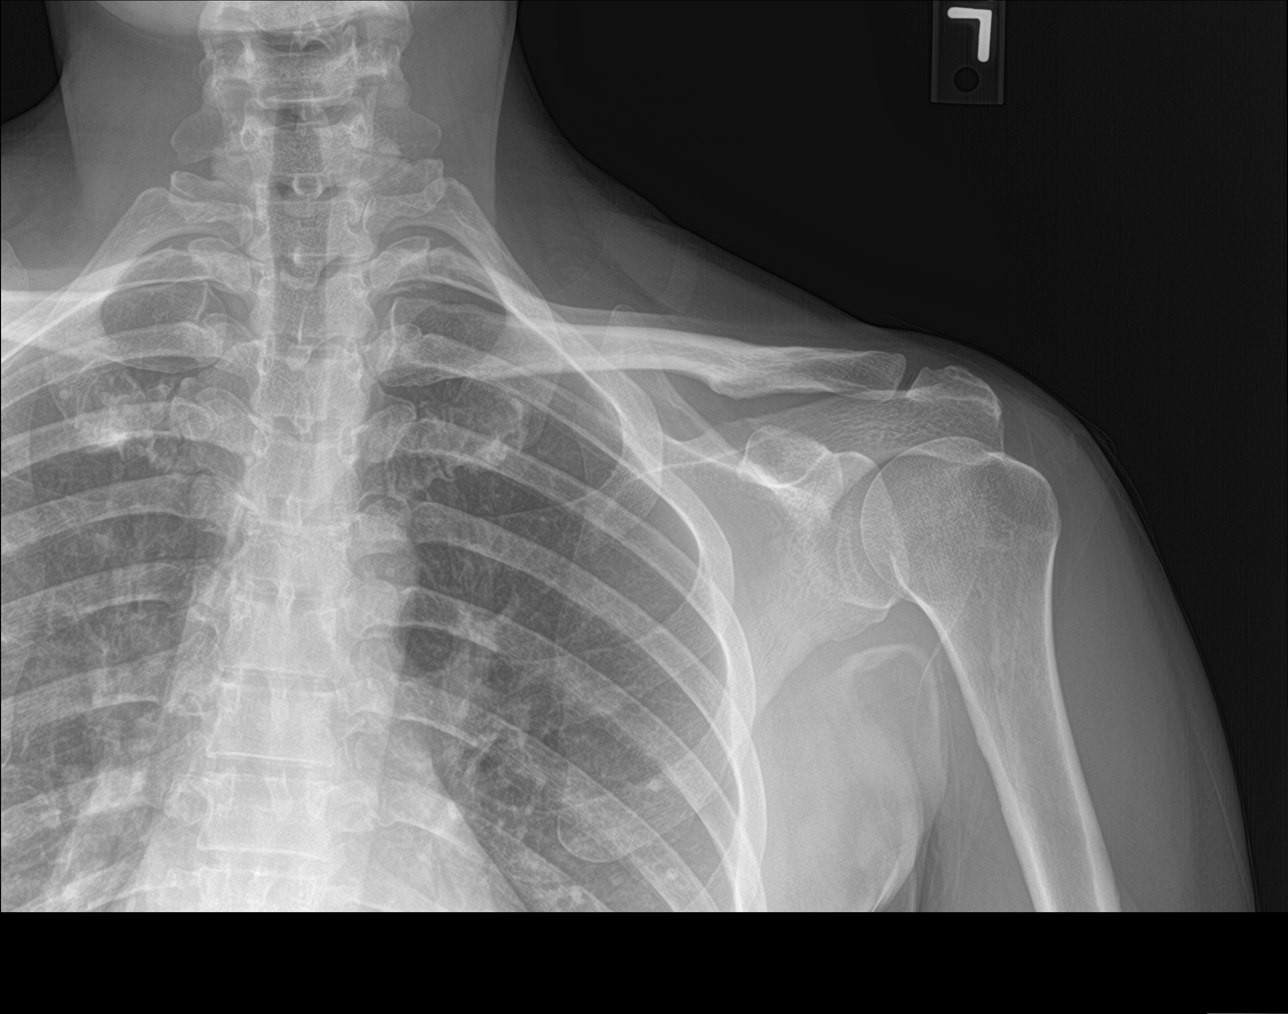

[shoulder grashey]
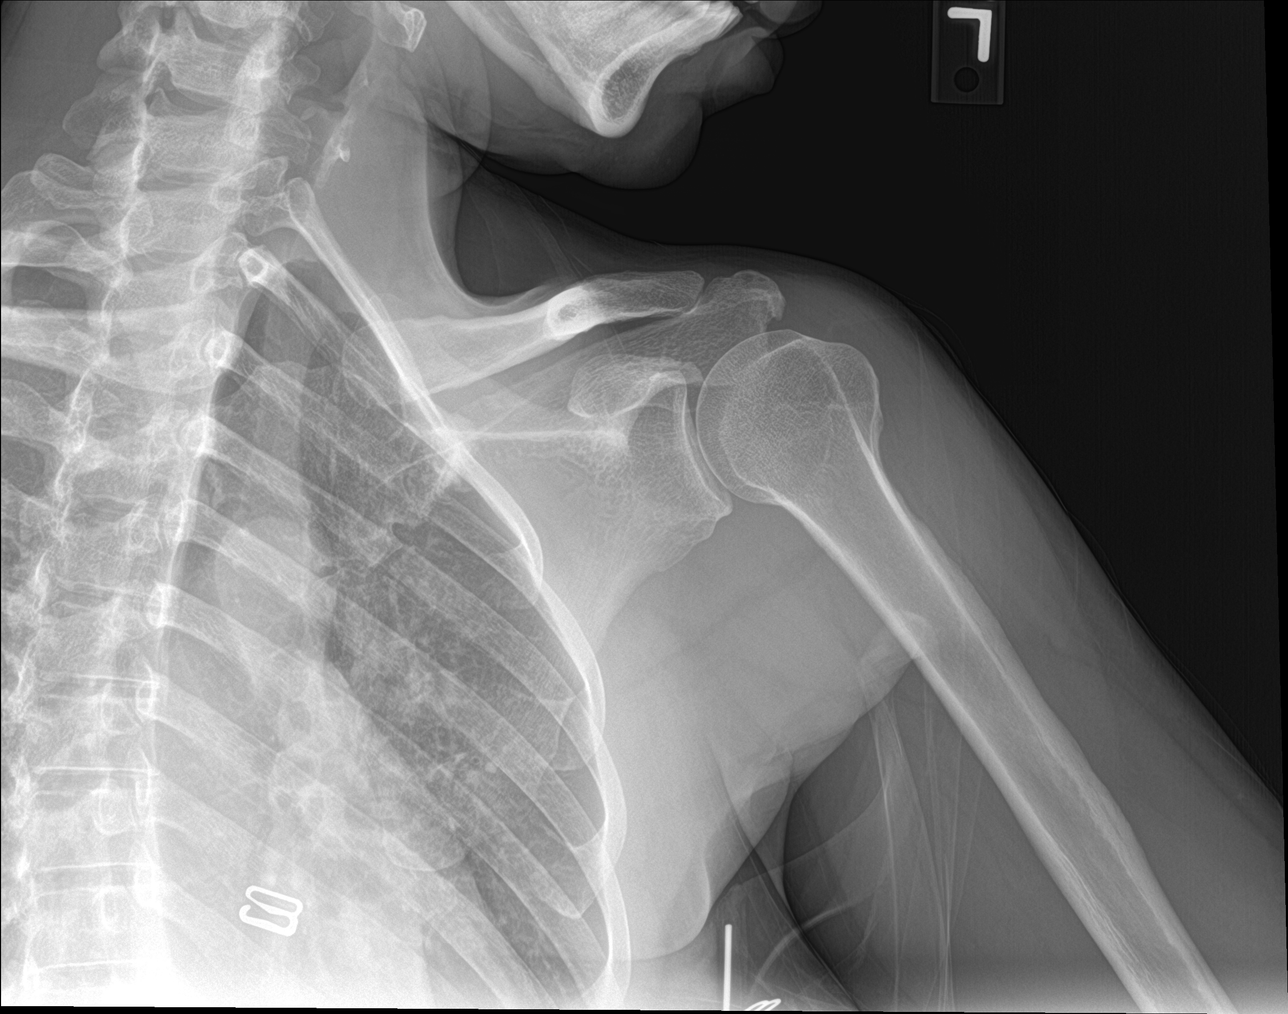

[shoulder y-view]
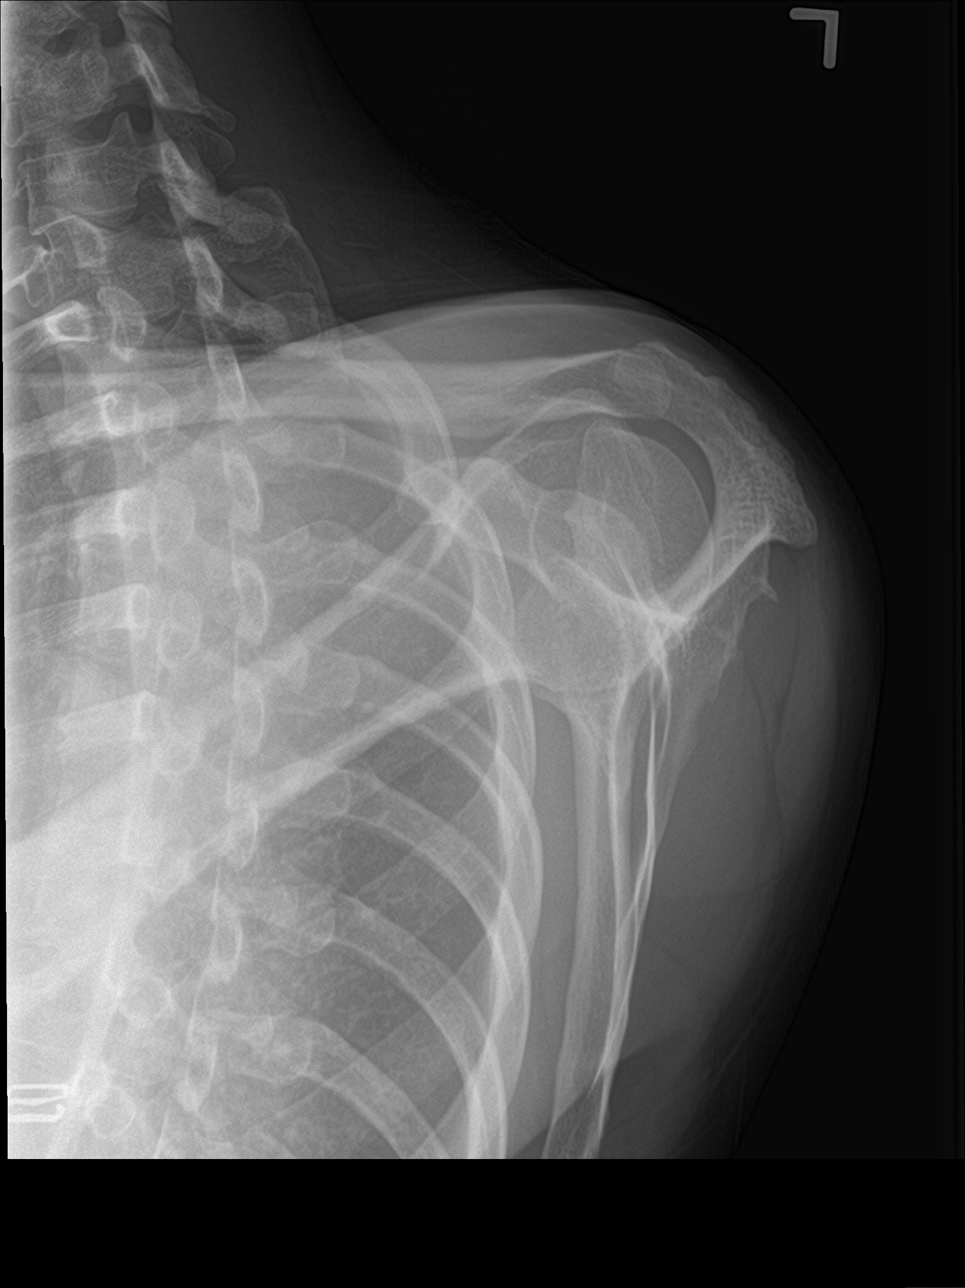

[shoulder axial]
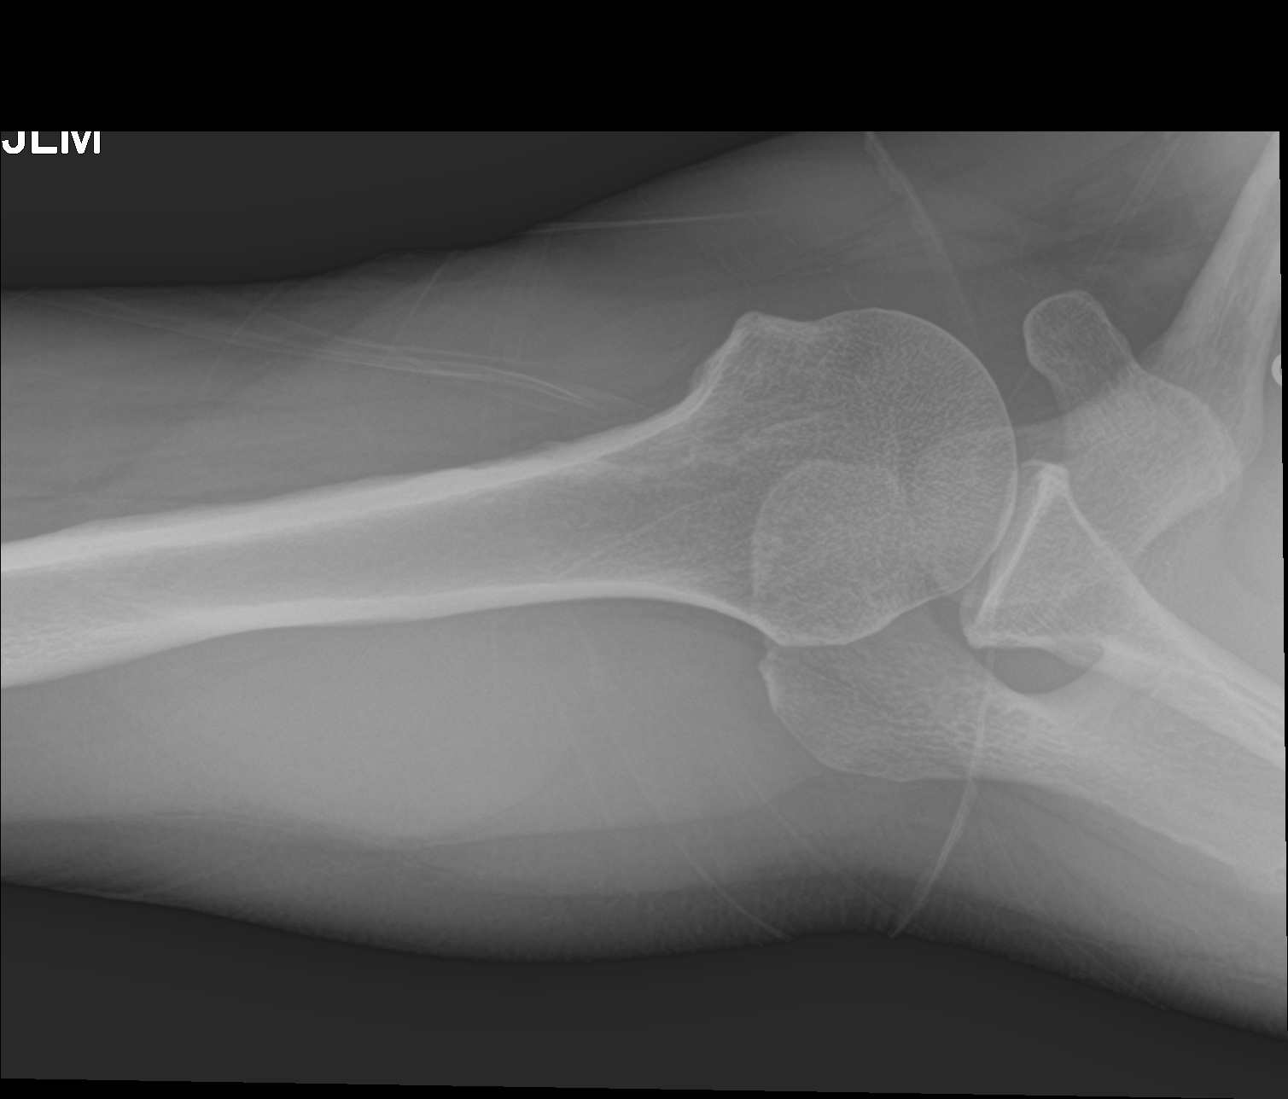

[4 of 4 positions shown; findings below may reference images not displayed]

FINDINGS: There is no evidence of fracture or dislocation. There is no
evidence of arthropathy or other focal bone abnormality. Soft
tissues are unremarkable.
IMPRESSION: Negative.

## 2023-01-06 ENCOUNTER — Encounter: Payer: Self-pay | Admitting: Family Medicine

## 2023-01-06 ENCOUNTER — Ambulatory Visit (INDEPENDENT_AMBULATORY_CARE_PROVIDER_SITE_OTHER): Payer: Medicaid Other | Admitting: Family Medicine

## 2023-01-06 ENCOUNTER — Other Ambulatory Visit (HOSPITAL_COMMUNITY)
Admission: RE | Admit: 2023-01-06 | Discharge: 2023-01-06 | Disposition: A | Discharge status: Home / Self Care | Payer: Medicaid Other | Source: Ambulatory Visit | Attending: Family Medicine | Admitting: Family Medicine

## 2023-01-06 VITALS — BP 147/105 | HR 62 | Ht 62.0 in | Wt 145.6 lb

## 2023-01-06 DIAGNOSIS — Z1231 Encounter for screening mammogram for malignant neoplasm of breast: Secondary | ICD-10-CM | POA: Diagnosis not present

## 2023-01-06 DIAGNOSIS — Z8619 Personal history of other infectious and parasitic diseases: Secondary | ICD-10-CM | POA: Insufficient documentation

## 2023-01-06 DIAGNOSIS — Z113 Encounter for screening for infections with a predominantly sexual mode of transmission: Secondary | ICD-10-CM | POA: Diagnosis not present

## 2023-01-06 DIAGNOSIS — K429 Umbilical hernia without obstruction or gangrene: Secondary | ICD-10-CM | POA: Diagnosis not present

## 2023-01-06 DIAGNOSIS — I10 Essential (primary) hypertension: Secondary | ICD-10-CM

## 2023-01-06 DIAGNOSIS — E669 Obesity, unspecified: Secondary | ICD-10-CM

## 2023-01-06 DIAGNOSIS — M545 Low back pain, unspecified: Secondary | ICD-10-CM | POA: Diagnosis not present

## 2023-01-06 DIAGNOSIS — Z0000 Encounter for general adult medical examination without abnormal findings: Secondary | ICD-10-CM

## 2023-01-06 DIAGNOSIS — G8929 Other chronic pain: Secondary | ICD-10-CM

## 2023-01-06 DIAGNOSIS — Z720 Tobacco use: Secondary | ICD-10-CM | POA: Diagnosis not present

## 2023-01-06 LAB — POCT GLYCOSYLATED HEMOGLOBIN (HGB A1C): Hemoglobin A1C: 5.4 % (ref 4.0–5.6)

## 2023-01-06 NOTE — Patient Instructions (Addendum)
It was wonderful to see you today! Thank you for choosing [facility].   Please bring ALL of your medications with you to every visit.   Today we talked about:  Please check your blood pressure once per day for the next 2 weeks.  Please see attached blood pressure log below for documentation that we can discuss at your follow-up. We are checking lab work for STIs and other blood work.  I will follow-up with those results. I ordered you a mammogram to be done at the breast center.  You can call them at [phone] to schedule an appointment for your mammogram.  They are located at [address] refer you to a general surgeon to discuss your hernia. Our office will follow-up with you about that referral.  Please follow up in 2 weeks  If you haven't already, sign up for My Chart to have easy access to your labs results, and communication with your primary care physician.   We are checking some labs today. If they are abnormal, I will call you. If they are normal, I will send you a MyChart message (if it is active) or a letter in the mail. If you do not hear about your labs in the next 2 weeks, please call the office.  Call the clinic at [phone] if your symptoms worsen or you have any concerns.  Please be sure to schedule follow up at the front desk before you leave today.   Elberta Fortis, DO Family Medicine    Blood Pressure Record Sheet To take your blood pressure, you will need a blood pressure machine. You can buy a blood pressure machine (blood pressure monitor) at your clinic, drug store, or online. When choosing one, consider: An automatic monitor that has an arm cuff. A cuff that wraps snugly around your upper arm. You should be able to fit only one finger between your arm and the cuff. A device that stores blood pressure reading results. Do not choose a monitor that measures your blood pressure from your wrist or finger. Follow your health care  provider's instructions for how to take your blood pressure. To use this form: Take your blood pressure medications every day These measurements should be taken when you have been at rest for at least 10-15 min Take at least 2 readings with each blood pressure check. This makes sure the results are correct. Wait 1-2 minutes between measurements. Write down the results in the spaces on this form. Keep in mind it should always be recorded systolic over diastolic. Both numbers are important.  Repeat this every day for 2-3 weeks, or as told by your health care provider.  Make a follow-up appointment with your health care provider to discuss the results.  Blood Pressure Log Date Medications taken? (Y/N) Blood Pressure Time of Day

## 2023-01-06 NOTE — Assessment & Plan Note (Signed)
147/105 in clinic, has not been on medication or checking at home. -BP log x 2 weeks and follow up for BP check

## 2023-01-06 NOTE — Progress Notes (Signed)
    SUBJECTIVE:   Chief compliant/HPI: annual examination  Anita Fuentes is a 42 y.o. who presents today for an annual exam.   Hypertension: - Medications: Amlodipine 5mg  daily - Compliance: Not taking - Checking BP at home: Not checking at home Elevated in the office today. Patient would like to check BP at home f/u to see if she needs BP meds.  Hernia Reports worsening bulging in and around her belly button. Occasionally painful with certain movements. Denies sustained pain episodes or bowel changes. H/o C/S x 4 and feels it started after that time.  Low back pain Reports worsening pain over the last ~8 months, particularly with certain movements. Denies inciting injury. 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 Pain does radiate down both thighs at times. Denies bladder or bowel changes. Denies numbness, tingling or weakness in legs.  Current smoker - currently 1 pack/day. Not interested in quitting at this time.   History tabs reviewed and updated.   OBJECTIVE:   BP (!) 147/105   Pulse 62   Ht 5\' 2"  (1.575 m)   Wt 145 lb 9.6 oz (66 kg)   LMP 12/27/2022   SpO2 100%   BMI 26.63 kg/m   General: Well-appearing. Alert. NAD HEENT: Normocephalic. White sclera. No rhinorrhea or congestion CV: RRR without murmur Pulm: CTAB. Normal WOB on RA. No wheezing Abdomen: Soft, non-distended. Umbilical hernia palpable, easily reducible. Ventral hernia just superior to umbilicus, easily reducible. Ext: Well perfused. Cap refill < 3 seconds Skin: Warm, dry. No rashes noted  MSK: TTP over lumbar paraspinal musculature. Full ROM on lumbar spine. No tenderness over spinous process. Negative straight leg raise bilaterally.  ASSESSMENT/PLAN:   HTN (hypertension) 147/105 in clinic, has not been on medication or checking at home. -BP log x 2 weeks and follow up for BP check  Umbilical hernia Easily reducible hernia above umbilicus. No  red flag symptoms but occasionally painful and patient would like to discuss repair. -Referral to General Surgery  Tobacco abuse 24 pack year history, not currently interested in quitting.  Low back pain Chronic, initial evaluation. No inciting injury but likely progressive overtime due to overuse and body mechanics. No red flag symptoms. Some radiation down bilateral thighs, in discussion with patient she opted for imaging. -Lumbar XR -Referral to PT for further eval and treatment    Annual Examination  See AVS for age appropriate recommendations.   PHQ score 2, reviewed and discussed.  Blood pressure reviewed and not at goal. The patient currently uses none for contraception. Folate recommended as appropriate, minimum of 400 mcg per day.   Considered the following items based upon USPSTF recommendations: Diabetes screening: ordered Screening for elevated cholesterol: Completed in 2023 - normal HIV testing: ordered Hepatitis C: ordered Syphilis if at high risk: ordered GC/CT at high risk, ordered.  Reviewed risk factors for latent tuberculosis and not indicated Reviewed risk factors for osteoporosis. Early screening not ordered  Cervical cancer screening: prior Pap reviewed, repeat due in 2026 Breast cancer screening: discussed and ordered mammogram based upon personal history  Colorectal cancer screening: not applicable given age.  if age 14 or over.   Follow up in 2 weeks for BP check    Elberta Fortis, MD [facility]

## 2023-01-06 NOTE — Assessment & Plan Note (Signed)
Chronic, initial evaluation. No inciting injury but likely progressive overtime due to overuse and body mechanics. No red flag symptoms. Some radiation down bilateral thighs, in discussion with patient she opted for imaging. -Lumbar XR -Referral to PT for further eval and treatment

## 2023-01-06 NOTE — Assessment & Plan Note (Signed)
Easily reducible hernia above umbilicus. No red flag symptoms but occasionally painful and patient would like to discuss repair. -Referral to General Surgery

## 2023-01-06 NOTE — Assessment & Plan Note (Signed)
24 pack year history, not currently interested in quitting.

## 2023-01-10 ENCOUNTER — Other Ambulatory Visit: Payer: Self-pay | Admitting: Family Medicine

## 2023-01-10 DIAGNOSIS — R7989 Other specified abnormal findings of blood chemistry: Secondary | ICD-10-CM

## 2023-01-10 LAB — COMPREHENSIVE METABOLIC PANEL
ALT: 14 IU/L (ref 0–32)
AST: 20 IU/L (ref 0–40)
Albumin: 4.5 g/dL (ref 3.9–4.9)
Alkaline Phosphatase: 73 IU/L (ref 44–121)
BUN/Creatinine Ratio: 12 (ref 9–23)
BUN: 12 mg/dL (ref 6–24)
Bilirubin Total: 0.5 mg/dL (ref 0.0–1.2)
CO2: 23 mmol/L (ref 20–29)
Calcium: 9.3 mg/dL (ref 8.7–10.2)
Chloride: 104 mmol/L (ref 96–106)
Creatinine, Ser: 1.03 mg/dL — ABNORMAL HIGH (ref 0.57–1.00)
Globulin, Total: 2.5 g/dL (ref 1.5–4.5)
Glucose: 84 mg/dL (ref 70–99)
Potassium: 4.1 mmol/L (ref 3.5–5.2)
Sodium: 144 mmol/L (ref 134–144)
Total Protein: 7 g/dL (ref 6.0–8.5)
eGFR: 70 mL/min/{1.73_m2} (ref >59)

## 2023-01-10 LAB — HIV ANTIBODY (ROUTINE TESTING W REFLEX): HIV Screen 4th Generation wRfx: NONREACTIVE

## 2023-01-10 LAB — RPR W/REFLEX TO TREPSURE: RPR: REACTIVE — AB

## 2023-01-10 LAB — CERVICOVAGINAL ANCILLARY ONLY
Chlamydia: NEGATIVE
Comment: NEGATIVE
Comment: NEGATIVE
Comment: NORMAL
Neisseria Gonorrhea: NEGATIVE
Trichomonas: NEGATIVE

## 2023-01-10 LAB — HEPATITIS C ANTIBODY: Hep C Virus Ab: NONREACTIVE

## 2023-01-10 LAB — RPR, QUANT: RPR, Quant: 1:2 {titer} — ABNORMAL HIGH

## 2023-01-20 NOTE — Progress Notes (Deleted)
    SUBJECTIVE:   CHIEF COMPLAINT / HPI:   Hypertension: - Medications: Amlodipine 5mg  - Compliance: *** - Checking BP at home: *** - Denies any SOB, CP, vision changes, LE edema, medication SEs, or symptoms of hypotension - Diet: *** - Exercise: ***   *Flu vaccine  PERTINENT  PMH / PSH: ***  OBJECTIVE:   LMP 12/27/2022  ***  General: NAD, pleasant, able to participate in exam Cardiac: RRR, no murmurs. Respiratory: CTAB, normal effort, No wheezes, rales or rhonchi Abdomen: Bowel sounds present, nontender, nondistended Extremities: no edema or cyanosis. Skin: warm and dry, no rashes noted Neuro: alert, no obvious focal deficits Psych: Normal affect and mood  ASSESSMENT/PLAN:   No problem-specific Assessment & Plan notes found for this encounter.     Dr. Elberta Fortis, DO [facility]    {    This will disappear when note is signed, click to select method of visit    :1}

## 2023-01-23 ENCOUNTER — Inpatient Hospital Stay: Admission: RE | Admit: 2023-01-23 | Payer: Medicaid Other | Source: Ambulatory Visit

## 2023-01-24 ENCOUNTER — Ambulatory Visit: Payer: Self-pay | Admitting: Family Medicine

## 2023-02-03 ENCOUNTER — Ambulatory Visit: Payer: Medicaid Other

## 2023-02-06 ENCOUNTER — Ambulatory Visit: Payer: Medicaid Other | Admitting: Family Medicine

## 2023-02-06 NOTE — Progress Notes (Deleted)
    SUBJECTIVE:   CHIEF COMPLAINT / HPI:   ***  PERTINENT  PMH / PSH: ***  OBJECTIVE:   There were no vitals taken for this visit.  ***  ASSESSMENT/PLAN:   No problem-specific Assessment & Plan notes found for this encounter.     Para March, DO [facility]

## 2023-03-07 ENCOUNTER — Ambulatory Visit: Payer: Medicaid Other

## 2023-05-01 ENCOUNTER — Ambulatory Visit (HOSPITAL_COMMUNITY): Payer: Medicaid Other

## 2023-05-04 ENCOUNTER — Ambulatory Visit
Admission: RE | Admit: 2023-05-04 | Discharge: 2023-05-04 | Disposition: A | Discharge status: Home / Self Care | Payer: Commercial Managed Care - HMO | Source: Ambulatory Visit | Attending: Family Medicine | Admitting: Family Medicine

## 2023-05-04 ENCOUNTER — Encounter: Payer: Self-pay | Admitting: Radiology

## 2023-05-04 DIAGNOSIS — Z1231 Encounter for screening mammogram for malignant neoplasm of breast: Secondary | ICD-10-CM | POA: Diagnosis not present

## 2023-05-09 ENCOUNTER — Other Ambulatory Visit: Payer: Self-pay | Admitting: Family Medicine

## 2023-05-09 DIAGNOSIS — R928 Other abnormal and inconclusive findings on diagnostic imaging of breast: Secondary | ICD-10-CM

## 2023-05-23 ENCOUNTER — Other Ambulatory Visit (HOSPITAL_COMMUNITY)
Admission: RE | Admit: 2023-05-23 | Discharge: 2023-05-23 | Disposition: A | Discharge status: Home / Self Care | Payer: Commercial Managed Care - HMO | Source: Ambulatory Visit | Attending: Family Medicine | Admitting: Family Medicine

## 2023-05-23 ENCOUNTER — Other Ambulatory Visit: Payer: Self-pay | Admitting: Family Medicine

## 2023-05-23 ENCOUNTER — Ambulatory Visit (INDEPENDENT_AMBULATORY_CARE_PROVIDER_SITE_OTHER): Payer: Commercial Managed Care - HMO | Admitting: Student

## 2023-05-23 VITALS — BP 158/109 | HR 76 | Ht 64.0 in | Wt 149.8 lb

## 2023-05-23 DIAGNOSIS — Z113 Encounter for screening for infections with a predominantly sexual mode of transmission: Secondary | ICD-10-CM | POA: Diagnosis not present

## 2023-05-23 DIAGNOSIS — I10 Essential (primary) hypertension: Secondary | ICD-10-CM | POA: Diagnosis not present

## 2023-05-23 DIAGNOSIS — R7989 Other specified abnormal findings of blood chemistry: Secondary | ICD-10-CM | POA: Insufficient documentation

## 2023-05-23 DIAGNOSIS — K130 Diseases of lips: Secondary | ICD-10-CM | POA: Diagnosis not present

## 2023-05-23 MED ORDER — VALACYCLOVIR HCL 1 G PO TABS: 1000.0000 mg | ORAL_TABLET | Freq: Two times a day (BID) | ORAL | 0 refills | Status: AC

## 2023-05-23 NOTE — Assessment & Plan Note (Signed)
BP elevated, has been elevated at past visits.  Not on medication.  Patient advised to schedule appointment in near future to discuss starting medication for hypertension.

## 2023-05-23 NOTE — Progress Notes (Signed)
    SUBJECTIVE:   CHIEF COMPLAINT / HPI:    lesion on lip Appeared yesterday Was painful but doesn't hurt now Has never had anything like it before No one she knows has similar sores Otherwise feeling well today  STD testing No new partners - has 1 partner No condoms, would like STD testing to be safe.  Has history syphilis, all other STD tests negative in 01/2024 Does have oral sex, would like throat swabbed as well  No concerning symptoms for STDs at this time    PERTINENT  PMH / PSH: History of syphilis, HTN  OBJECTIVE:   BP (!) 158/109   Pulse 76   Ht 5\' 4"  (1.626 m)   Wt 149 lb 12.8 oz (67.9 kg)   LMP 05/21/2023   SpO2 100%   BMI 25.71 kg/m    General: NAD, pleasant, able to participate in exam Cardiac: Well-perfused Respiratory: Breathing comfortably Skin: Erythematous crusting lesion on right side of vermilion border. GU: Normal external female genitalia, moist pink vaginal mucosa, some blood from cervical os (on menstrual cycle), normal-appearing cervix, clear discharge Neuro: alert, no obvious focal deficits Psych: Normal affect and mood  ASSESSMENT/PLAN:   HTN (hypertension) BP elevated, has been elevated at past visits.  Not on medication.  Patient advised to schedule appointment in near future to discuss starting medication for hypertension.  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  Dr. Ardyth Harps had already ordered future BMP order -BMP today -If still elevated and especially if worse, stress importance of BP control and will obtain UACR  Screen for STD (sexually transmitted disease) Routine screening -Vaginal and throat swabs for gonorrhea,  chlamydia, and trichomonas -HIV and RPR (has history RPR, will check titers)     Dr. Erick Alley, DO  [facility]

## 2023-05-23 NOTE — Patient Instructions (Signed)
It was great to see you! Thank you for allowing me to participate in your care!  I recommend that you always bring your medications to each appointment as this makes it easy to ensure you are on the correct medications and helps [place] not miss when refills are needed.  Our plans for today:  -Prescription for valacyclovir has been sent to the pharmacy.  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  -If the lesions return, please let us know and we can send in another prescription.  If the lesions return very frequently, we can consider suppressive therapy   We are checking some labs today, I will call you if they are abnormal will send you a MyChart message or a letter if they are normal.  If you do not hear about your labs in the next 2 weeks please let us know.  Take care and seek immediate care sooner if you develop any concerns.   Dr. Erick Alley, DO [facility]

## 2023-05-23 NOTE — Assessment & Plan Note (Signed)
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

## 2023-05-23 NOTE — Assessment & Plan Note (Addendum)
Mildly elevated at previous visit in September.  Dr. Ardyth Harps had already ordered future BMP order -BMP today -If still elevated and especially if worse, stress importance of BP control and will obtain UACR

## 2023-05-23 NOTE — Assessment & Plan Note (Signed)
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

## 2023-05-24 LAB — CERVICOVAGINAL ANCILLARY ONLY
Chlamydia: NEGATIVE
Chlamydia: NEGATIVE
Comment: NEGATIVE
Comment: NORMAL
Comment: NEGATIVE
Comment: NEGATIVE
Comment: NORMAL
Neisseria Gonorrhea: NEGATIVE
Neisseria Gonorrhea: NEGATIVE
Trichomonas: NEGATIVE

## 2023-05-25 ENCOUNTER — Encounter: Payer: Self-pay | Admitting: Student

## 2023-05-25 LAB — RPR, QUANT+TP ABS (REFLEX): Rapid Plasma Reagin, Quant: 1:4 {titer} — ABNORMAL HIGH

## 2023-05-25 LAB — BASIC METABOLIC PANEL
BUN/Creatinine Ratio: 9 (ref 9–23)
BUN: 8 mg/dL (ref 6–24)
CO2: 22 mmol/L (ref 20–29)
Calcium: 9.5 mg/dL (ref 8.7–10.2)
Chloride: 102 mmol/L (ref 96–106)
Creatinine, Ser: 0.91 mg/dL (ref 0.57–1.00)
Glucose: 94 mg/dL (ref 70–99)
Potassium: 4.3 mmol/L (ref 3.5–5.2)
Sodium: 141 mmol/L (ref 134–144)
eGFR: 81 mL/min/{1.73_m2} (ref >59)

## 2023-05-25 LAB — RPR: RPR Ser Ql: REACTIVE — AB

## 2023-05-25 LAB — HIV ANTIBODY (ROUTINE TESTING W REFLEX): HIV Screen 4th Generation wRfx: NONREACTIVE

## 2023-05-25 LAB — SPECIMEN STATUS REPORT

## 2023-05-27 LAB — HERPES SIMPLEX VIRUS CULTURE

## 2023-05-30 ENCOUNTER — Encounter: Payer: Self-pay | Admitting: Student

## 2023-06-07 NOTE — Progress Notes (Deleted)
  SUBJECTIVE:   CHIEF COMPLAINT / HPI:   HTN Meds: Amlodipine ?    PERTINENT  PMH / PSH: ***  Past Medical History:  Diagnosis Date   Abnormal Pap smear    Medication was treatment   History of anemia 11/04/2011   History of chlamydia 1998   History of gonorrhea    Hx of syphilis 1998   Hx: UTI (urinary tract infection)    Hypertension    Migraines    TYPICALLY TAKES EXCEDERIN MIGRAINE   Yeast infection    OBJECTIVE:  LMP 05/21/2023  Physical Exam   ASSESSMENT/PLAN:   Assessment & Plan  No follow-ups on file. Penne Rhein, MD 06/07/2023, 6:19 PM PGY-***, [facility] {    This will disappear when note is signed, click to select method of visit    :1}

## 2023-06-08 ENCOUNTER — Ambulatory Visit: Payer: Commercial Managed Care - HMO | Admitting: Student

## 2023-06-08 ENCOUNTER — Encounter: Payer: Self-pay | Admitting: *Deleted

## 2023-06-12 ENCOUNTER — Other Ambulatory Visit: Payer: Self-pay | Admitting: Family Medicine

## 2023-06-12 ENCOUNTER — Ambulatory Visit
Admission: RE | Admit: 2023-06-12 | Discharge: 2023-06-12 | Disposition: A | Discharge status: Home / Self Care | Payer: Commercial Managed Care - HMO | Source: Ambulatory Visit | Attending: Family Medicine | Admitting: Family Medicine

## 2023-06-12 DIAGNOSIS — N6320 Unspecified lump in the left breast, unspecified quadrant: Secondary | ICD-10-CM

## 2023-06-12 DIAGNOSIS — R928 Other abnormal and inconclusive findings on diagnostic imaging of breast: Secondary | ICD-10-CM | POA: Diagnosis not present

## 2023-06-12 DIAGNOSIS — N6315 Unspecified lump in the right breast, overlapping quadrants: Secondary | ICD-10-CM | POA: Diagnosis not present

## 2023-06-12 DIAGNOSIS — N6310 Unspecified lump in the right breast, unspecified quadrant: Secondary | ICD-10-CM

## 2023-06-12 DIAGNOSIS — N6321 Unspecified lump in the left breast, upper outer quadrant: Secondary | ICD-10-CM | POA: Diagnosis not present

## 2023-06-14 ENCOUNTER — Ambulatory Visit: Payer: Commercial Managed Care - HMO | Admitting: Student

## 2023-06-15 ENCOUNTER — Ambulatory Visit
Admission: RE | Admit: 2023-06-15 | Discharge: 2023-06-15 | Disposition: A | Discharge status: Home / Self Care | Payer: Commercial Managed Care - HMO | Source: Ambulatory Visit | Attending: Family Medicine | Admitting: Family Medicine

## 2023-06-15 DIAGNOSIS — N6315 Unspecified lump in the right breast, overlapping quadrants: Secondary | ICD-10-CM | POA: Diagnosis not present

## 2023-06-15 DIAGNOSIS — N6320 Unspecified lump in the left breast, unspecified quadrant: Secondary | ICD-10-CM

## 2023-06-15 DIAGNOSIS — N6310 Unspecified lump in the right breast, unspecified quadrant: Secondary | ICD-10-CM

## 2023-06-15 DIAGNOSIS — N6311 Unspecified lump in the right breast, upper outer quadrant: Secondary | ICD-10-CM | POA: Diagnosis not present

## 2023-06-15 DIAGNOSIS — N6321 Unspecified lump in the left breast, upper outer quadrant: Secondary | ICD-10-CM | POA: Diagnosis not present

## 2023-06-15 DIAGNOSIS — N6312 Unspecified lump in the right breast, upper inner quadrant: Secondary | ICD-10-CM | POA: Diagnosis not present

## 2023-06-15 DIAGNOSIS — R928 Other abnormal and inconclusive findings on diagnostic imaging of breast: Secondary | ICD-10-CM

## 2023-06-15 HISTORY — PX: BREAST BIOPSY: SHX20

## 2023-06-16 ENCOUNTER — Other Ambulatory Visit: Payer: Self-pay | Admitting: Family Medicine

## 2023-06-16 DIAGNOSIS — R928 Other abnormal and inconclusive findings on diagnostic imaging of breast: Secondary | ICD-10-CM

## 2023-06-16 LAB — SURGICAL PATHOLOGY

## 2023-06-21 ENCOUNTER — Telehealth: Payer: Self-pay | Admitting: Family Medicine

## 2023-06-21 NOTE — Progress Notes (Unsigned)
    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  Smoking about 1 pack/day currently.  Dental pain Ongoing x 1 week.  Associated with headache. H/o wisedom teeth removal in 2023. Dentist appointment next month.  Still eating and drinking okay.  Notable swelling of right cheek.  Pain moves from right mandibular molar area to left mandibular molar area.  Low back pain, right leg weakness Works as a Lawyer, lifting patients frequently.  Ongoing low back pain but feels like her right leg felt like it was going to give out recently.  Notes burning in her right foot.  Taken ibuprofen 800 mg x 1 otherwise has not had imaging or therapy.  PERTINENT  PMH / PSH: HTN: Tobacco use, chronic low back pain, HTN, anemia, MDD  OBJECTIVE:   BP (!) 132/96   Pulse 71   Ht 5\' 4"  (1.626 m)   Wt 147 lb 6.4 oz (66.9 kg)   SpO2 100%   BMI 25.30 kg/m    General: Alert, no apparent distress, well groomed HEENT: Normocephalic, atraumatic, moist mucus membranes, neck supple.  Break in the skin with mild surrounding erythema but no purulent drainage noted on left mandibular molar area.  Right mandibular molar area with mild edema but no fluctuance or erythema noted. Respiratory: Normal respiratory effort GI: Non-distended Skin: No rashes, no jaundice Psych: Appropriate mood and affect MSK: Nontender over lumbar spinous processes.  Mild hypertonicity of the right lumbar paraspinal musculature.  Equal strength and sensation bilaterally.  ASSESSMENT/PLAN:   Assessment & Plan Primary hypertension 132/96 upon repeat, given recurrent elevated pressures needs medication management today. -Start olmesartan-HCTZ  20-12.5 mg daily Chronic right-sided low back pain with right-sided sciatica Possible disc herniation with nerve impingement given reported right leg weakness and burning.  Shared decision making patient opted for imaging and therapy. -Lumbar x-ray -Referral to PT -Naproxen 500 mg twice daily x 2 weeks Tobacco abuse Currently smoking 1 pack/day, not ready to quit. Pain, dental Likely traumatic injury to wisdom teeth removal sites.  Follow-up with dentist as planned.  Pain control with naproxen as above.   Dr. Elberta Fortis, DO  [facility]

## 2023-06-21 NOTE — Telephone Encounter (Signed)
-----   Message from [facility] McDiarmid sent at 06/16/2023  2:21 PM EST ----- Ms Keeran's atypical ductal hyperplasia does increase her chances of developing breast cancer. Usually the breast center will arrange the patient referral to general surgery. I often create a reminder to check after a few days that the referral was made. ----- Message ----- From: Interface, [facility] Sent: 06/16/2023   9:55 AM EST To: Leighton Roach McDiarmid, MD

## 2023-06-21 NOTE — Telephone Encounter (Signed)
Attempted to call, unable to leave voicemail.  Patient needs general surgery referral for atypical ductal hyperplasia as seen by her breast biopsies.  If patient calls back please confirm referral and follow-up.  Elberta Fortis, DO

## 2023-06-22 ENCOUNTER — Inpatient Hospital Stay: Admission: RE | Admit: 2023-06-22 | Payer: Commercial Managed Care - HMO | Source: Ambulatory Visit

## 2023-06-26 ENCOUNTER — Encounter: Payer: Self-pay | Admitting: Family Medicine

## 2023-06-26 ENCOUNTER — Ambulatory Visit (INDEPENDENT_AMBULATORY_CARE_PROVIDER_SITE_OTHER): Payer: Commercial Managed Care - HMO | Admitting: Family Medicine

## 2023-06-26 VITALS — BP 132/96 | HR 71 | Ht 64.0 in | Wt 147.4 lb

## 2023-06-26 DIAGNOSIS — G8929 Other chronic pain: Secondary | ICD-10-CM

## 2023-06-26 DIAGNOSIS — Z720 Tobacco use: Secondary | ICD-10-CM | POA: Diagnosis not present

## 2023-06-26 DIAGNOSIS — I10 Essential (primary) hypertension: Secondary | ICD-10-CM

## 2023-06-26 DIAGNOSIS — K0889 Other specified disorders of teeth and supporting structures: Secondary | ICD-10-CM

## 2023-06-26 DIAGNOSIS — M5441 Lumbago with sciatica, right side: Secondary | ICD-10-CM | POA: Diagnosis not present

## 2023-06-26 MED ORDER — OLMESARTAN MEDOXOMIL-HCTZ 20-12.5 MG PO TABS: 1.0000 | ORAL_TABLET | Freq: Every day | ORAL | 1 refills | Status: DC

## 2023-06-26 MED ORDER — NAPROXEN 500 MG PO TABS: 500.0000 mg | ORAL_TABLET | Freq: Two times a day (BID) | ORAL | 0 refills | Status: AC

## 2023-06-26 NOTE — Patient Instructions (Signed)
 It was wonderful to see you today! Thank you for choosing [facility].   Please bring ALL of your medications with you to every visit.   Today we talked about:  We are starting medication for your blood pressure today.  Please take the combination pill once daily and monitor your blood pressure at home.  The goal blood pressure is less than 130/80.  I would like to see you back to check your blood work in 2 to 3 weeks and see how the medication is doing for you. For your low back pain I would like to get an x-ray to start and refer you to physical therapy to help with your symptoms.  You may also take the naproxen 500 mg twice per day for the next 2 weeks to help calm down inflammation of the area.  Please try to avoid heavy lifting if you can help it at work though a note may be unavoidable.  I do think your low back pain may be causing your right leg to feel like it is getting out and have burning in your right foot.  Lets improve the pain first and see if that resolves your symptoms. For your dental pain I do see there may be areas that the dentist needs to look at further for possible revision of your wisdom tooth removal.  The naproxen will help with your pain but please follow-up with the dentist as scheduled.  Please follow up in 2-3 weeks for blood pressure check  Call the clinic at [phone] if your symptoms worsen or you have any concerns.  Please be sure to schedule follow up at the front desk before you leave today.   Elberta Fortis, DO Family Medicine

## 2023-06-27 NOTE — Assessment & Plan Note (Signed)
 Currently smoking 1 pack/day, not ready to quit.

## 2023-06-27 NOTE — Assessment & Plan Note (Signed)
 Possible disc herniation with nerve impingement given reported right leg weakness and burning.  Shared decision making patient opted for imaging and therapy. -Lumbar x-ray -Referral to PT -Naproxen 500 mg twice daily x 2 weeks

## 2023-06-27 NOTE — Assessment & Plan Note (Addendum)
 132/96 upon repeat, given recurrent elevated pressures needs medication management today. -Start olmesartan-HCTZ 20-12.5 mg daily

## 2023-06-29 ENCOUNTER — Inpatient Hospital Stay: Admission: RE | Admit: 2023-06-29 | Payer: Commercial Managed Care - HMO | Source: Ambulatory Visit

## 2023-07-11 ENCOUNTER — Ambulatory Visit: Payer: Self-pay | Admitting: General Surgery

## 2023-07-11 ENCOUNTER — Telehealth: Payer: Self-pay | Admitting: Family Medicine

## 2023-07-11 DIAGNOSIS — N6081 Other benign mammary dysplasias of right breast: Secondary | ICD-10-CM | POA: Diagnosis not present

## 2023-07-11 DIAGNOSIS — N6091 Unspecified benign mammary dysplasia of right breast: Secondary | ICD-10-CM

## 2023-07-11 NOTE — Telephone Encounter (Signed)
 Encounter generated in error

## 2023-07-12 ENCOUNTER — Other Ambulatory Visit: Payer: Self-pay | Admitting: General Surgery

## 2023-07-12 DIAGNOSIS — N6091 Unspecified benign mammary dysplasia of right breast: Secondary | ICD-10-CM

## 2023-07-17 ENCOUNTER — Other Ambulatory Visit: Payer: Self-pay | Admitting: Family Medicine

## 2023-07-17 ENCOUNTER — Ambulatory Visit
Admission: RE | Admit: 2023-07-17 | Discharge: 2023-07-17 | Disposition: A | Discharge status: Home / Self Care | Payer: Commercial Managed Care - HMO | Source: Ambulatory Visit | Attending: Family Medicine | Admitting: Family Medicine

## 2023-07-17 ENCOUNTER — Ambulatory Visit
Admission: RE | Admit: 2023-07-17 | Discharge: 2023-07-17 | Disposition: A | Discharge status: Home / Self Care | Source: Ambulatory Visit | Attending: Family Medicine | Admitting: Family Medicine

## 2023-07-17 DIAGNOSIS — R928 Other abnormal and inconclusive findings on diagnostic imaging of breast: Secondary | ICD-10-CM

## 2023-07-17 DIAGNOSIS — N611 Abscess of the breast and nipple: Secondary | ICD-10-CM

## 2023-07-17 DIAGNOSIS — N6002 Solitary cyst of left breast: Secondary | ICD-10-CM | POA: Diagnosis not present

## 2023-07-19 DIAGNOSIS — Z0120 Encounter for dental examination and cleaning without abnormal findings: Secondary | ICD-10-CM | POA: Diagnosis not present

## 2023-07-20 ENCOUNTER — Other Ambulatory Visit: Payer: Self-pay

## 2023-07-20 ENCOUNTER — Encounter (HOSPITAL_BASED_OUTPATIENT_CLINIC_OR_DEPARTMENT_OTHER): Payer: Self-pay | Admitting: General Surgery

## 2023-07-24 ENCOUNTER — Encounter (HOSPITAL_BASED_OUTPATIENT_CLINIC_OR_DEPARTMENT_OTHER)
Admission: RE | Admit: 2023-07-24 | Discharge: 2023-07-24 | Disposition: A | Discharge status: Home / Self Care | Source: Ambulatory Visit | Attending: General Surgery | Admitting: General Surgery

## 2023-07-24 ENCOUNTER — Other Ambulatory Visit: Payer: Self-pay | Admitting: Family Medicine

## 2023-07-24 DIAGNOSIS — Z01812 Encounter for preprocedural laboratory examination: Secondary | ICD-10-CM | POA: Diagnosis present

## 2023-07-24 DIAGNOSIS — R9431 Abnormal electrocardiogram [ECG] [EKG]: Secondary | ICD-10-CM | POA: Insufficient documentation

## 2023-07-24 DIAGNOSIS — Z01810 Encounter for preprocedural cardiovascular examination: Secondary | ICD-10-CM | POA: Diagnosis present

## 2023-07-24 DIAGNOSIS — G8929 Other chronic pain: Secondary | ICD-10-CM

## 2023-07-24 DIAGNOSIS — Z01818 Encounter for other preprocedural examination: Secondary | ICD-10-CM | POA: Insufficient documentation

## 2023-07-24 LAB — BASIC METABOLIC PANEL
Anion gap: 6 (ref 5–15)
BUN: 15 mg/dL (ref 6–20)
CO2: 27 mmol/L (ref 22–32)
Calcium: 9.4 mg/dL (ref 8.9–10.3)
Chloride: 107 mmol/L (ref 98–111)
Creatinine, Ser: 1.03 mg/dL — ABNORMAL HIGH (ref 0.44–1.00)
GFR, Estimated: >60 mL/min (ref >60)
Glucose, Bld: 95 mg/dL (ref 70–99)
Potassium: 4.1 mmol/L (ref 3.5–5.1)
Sodium: 140 mmol/L (ref 135–145)

## 2023-07-24 NOTE — Progress Notes (Signed)
 Patient was provided with CHG cleanser to use at home before the procedure. Patient verbalized understanding of instructions.Patient was provided with CHG cleanser to use at home before the procedure. Patient verbalized understanding of instructions.     Enhanced Recovery after Surgery Enhanced Recovery after Surgery is a protocol used to improve the stress on your body and your recovery after surgery.  Patient Instructions  The night before surgery:  No food after midnight. ONLY clear liquids after midnight  The day of surgery (if you do NOT have diabetes):  Drink ONE (1) Pre-Surgery Clear Ensure as directed.   This drink was given to you during your hospital  pre-op appointment visit. The pre-op nurse will instruct you on the time to drink the  Pre-Surgery Ensure depending on your surgery time. Finish the drink at the designated time by the pre-op nurse.  Nothing else to drink after completing the  Pre-Surgery Clear Ensure.  The day of surgery (if you have diabetes): Drink ONE (1) Gatorade 2 (G2) as directed. This drink was given to you during your hospital  pre-op appointment visit.  The pre-op nurse will instruct you on the time to drink the   Gatorade 2 (G2) depending on your surgery time. Color of the Gatorade may vary. Red is not allowed. Nothing else to drink after completing the  Gatorade 2 (G2).         If you have questions, please contact your surgeon's office.

## 2023-07-26 ENCOUNTER — Ambulatory Visit
Admission: RE | Admit: 2023-07-26 | Discharge: 2023-07-26 | Disposition: A | Discharge status: Home / Self Care | Source: Ambulatory Visit | Attending: General Surgery | Admitting: General Surgery

## 2023-07-26 DIAGNOSIS — N6091 Unspecified benign mammary dysplasia of right breast: Secondary | ICD-10-CM

## 2023-07-26 DIAGNOSIS — D241 Benign neoplasm of right breast: Secondary | ICD-10-CM | POA: Diagnosis not present

## 2023-07-26 HISTORY — PX: BREAST BIOPSY: SHX20

## 2023-07-27 ENCOUNTER — Encounter (HOSPITAL_BASED_OUTPATIENT_CLINIC_OR_DEPARTMENT_OTHER): Payer: Self-pay | Admitting: General Surgery

## 2023-07-27 ENCOUNTER — Other Ambulatory Visit: Payer: Self-pay

## 2023-07-27 ENCOUNTER — Ambulatory Visit
Admission: RE | Admit: 2023-07-27 | Discharge: 2023-07-27 | Disposition: A | Discharge status: Home / Self Care | Source: Ambulatory Visit | Attending: General Surgery | Admitting: General Surgery

## 2023-07-27 ENCOUNTER — Ambulatory Visit (HOSPITAL_BASED_OUTPATIENT_CLINIC_OR_DEPARTMENT_OTHER)
Admission: RE | Admit: 2023-07-27 | Discharge: 2023-07-27 | Disposition: A | Discharge status: Home / Self Care | Attending: General Surgery | Admitting: General Surgery

## 2023-07-27 ENCOUNTER — Ambulatory Visit (HOSPITAL_BASED_OUTPATIENT_CLINIC_OR_DEPARTMENT_OTHER): Admitting: Anesthesiology

## 2023-07-27 ENCOUNTER — Encounter (HOSPITAL_BASED_OUTPATIENT_CLINIC_OR_DEPARTMENT_OTHER)
Admission: RE | Disposition: A | Discharge status: Home / Self Care | Payer: Self-pay | Source: Home / Self Care | Attending: General Surgery

## 2023-07-27 DIAGNOSIS — Z791 Long term (current) use of non-steroidal anti-inflammatories (NSAID): Secondary | ICD-10-CM | POA: Insufficient documentation

## 2023-07-27 DIAGNOSIS — Z01818 Encounter for other preprocedural examination: Secondary | ICD-10-CM

## 2023-07-27 DIAGNOSIS — R519 Headache, unspecified: Secondary | ICD-10-CM | POA: Insufficient documentation

## 2023-07-27 DIAGNOSIS — I10 Essential (primary) hypertension: Secondary | ICD-10-CM | POA: Diagnosis not present

## 2023-07-27 DIAGNOSIS — D241 Benign neoplasm of right breast: Secondary | ICD-10-CM | POA: Insufficient documentation

## 2023-07-27 DIAGNOSIS — F32A Depression, unspecified: Secondary | ICD-10-CM | POA: Diagnosis not present

## 2023-07-27 DIAGNOSIS — Z79899 Other long term (current) drug therapy: Secondary | ICD-10-CM | POA: Insufficient documentation

## 2023-07-27 DIAGNOSIS — N6011 Diffuse cystic mastopathy of right breast: Secondary | ICD-10-CM | POA: Diagnosis not present

## 2023-07-27 DIAGNOSIS — N6091 Unspecified benign mammary dysplasia of right breast: Secondary | ICD-10-CM

## 2023-07-27 DIAGNOSIS — F17210 Nicotine dependence, cigarettes, uncomplicated: Secondary | ICD-10-CM | POA: Diagnosis not present

## 2023-07-27 HISTORY — PX: BREAST LUMPECTOMY WITH RADIOACTIVE SEED LOCALIZATION: SHX6424

## 2023-07-27 LAB — POCT PREGNANCY, URINE: Preg Test, Ur: NEGATIVE

## 2023-07-27 SURGERY — BREAST LUMPECTOMY WITH RADIOACTIVE SEED LOCALIZATION
Anesthesia: General | Site: Breast | Laterality: Right

## 2023-07-27 MED ORDER — PROPOFOL 10 MG/ML IV BOLUS
INTRAVENOUS | Status: DC | PRN
  Administered 2023-07-27: 200 mg via INTRAVENOUS

## 2023-07-27 MED ORDER — PHENYLEPHRINE 80 MCG/ML (10ML) SYRINGE FOR IV PUSH (FOR BLOOD PRESSURE SUPPORT)
PREFILLED_SYRINGE | INTRAVENOUS | Status: AC
  Filled 2023-07-27: qty 10

## 2023-07-27 MED ORDER — ONDANSETRON HCL 4 MG/2ML IJ SOLN
INTRAMUSCULAR | Status: AC
Start: 2023-07-27 — End: ?
  Filled 2023-07-27: qty 2

## 2023-07-27 MED ORDER — FENTANYL CITRATE (PF) 100 MCG/2ML IJ SOLN
INTRAMUSCULAR | Status: AC
  Filled 2023-07-27: qty 2

## 2023-07-27 MED ORDER — CHLORHEXIDINE GLUCONATE CLOTH 2 % EX PADS: 6.0000 | MEDICATED_PAD | Freq: Once | CUTANEOUS | Status: DC

## 2023-07-27 MED ORDER — OXYCODONE HCL 5 MG PO TABS: 5.0000 mg | ORAL_TABLET | Freq: Four times a day (QID) | ORAL | 0 refills | Status: AC | PRN

## 2023-07-27 MED ORDER — MIDAZOLAM HCL 2 MG/2ML IJ SOLN
INTRAMUSCULAR | Status: AC
  Filled 2023-07-27: qty 2

## 2023-07-27 MED ORDER — AMISULPRIDE (ANTIEMETIC) 5 MG/2ML IV SOLN: 10.0000 mg | Freq: Once | INTRAVENOUS | Status: DC | PRN

## 2023-07-27 MED ORDER — DEXMEDETOMIDINE HCL IN NACL 80 MCG/20ML IV SOLN
INTRAVENOUS | Status: DC | PRN
  Administered 2023-07-27: 12 ug via INTRAVENOUS

## 2023-07-27 MED ORDER — ACETAMINOPHEN 500 MG PO TABS
1000.0000 mg | ORAL_TABLET | Freq: Once | ORAL | Status: AC
  Administered 2023-07-27: 1000 mg via ORAL

## 2023-07-27 MED ORDER — BUPIVACAINE-EPINEPHRINE (PF) 0.25% -1:200000 IJ SOLN
INTRAMUSCULAR | Status: AC
  Filled 2023-07-27: qty 60

## 2023-07-27 MED ORDER — LACTATED RINGERS IV SOLN: INTRAVENOUS | Status: DC

## 2023-07-27 MED ORDER — FENTANYL CITRATE (PF) 100 MCG/2ML IJ SOLN
INTRAMUSCULAR | Status: DC | PRN
  Administered 2023-07-27: 50 ug via INTRAVENOUS
  Administered 2023-07-27 (×2): 25 ug via INTRAVENOUS

## 2023-07-27 MED ORDER — ONDANSETRON HCL 4 MG/2ML IJ SOLN
INTRAMUSCULAR | Status: DC | PRN
  Administered 2023-07-27: 4 mg via INTRAVENOUS

## 2023-07-27 MED ORDER — DEXAMETHASONE SODIUM PHOSPHATE 10 MG/ML IJ SOLN
INTRAMUSCULAR | Status: DC | PRN
  Administered 2023-07-27: 10 mg via INTRAVENOUS

## 2023-07-27 MED ORDER — BUPIVACAINE-EPINEPHRINE (PF) 0.25% -1:200000 IJ SOLN
INTRAMUSCULAR | Status: DC | PRN
  Administered 2023-07-27: 20 mL

## 2023-07-27 MED ORDER — KETOROLAC TROMETHAMINE 30 MG/ML IJ SOLN
INTRAMUSCULAR | Status: AC
  Filled 2023-07-27: qty 1

## 2023-07-27 MED ORDER — GABAPENTIN 100 MG PO CAPS
100.0000 mg | ORAL_CAPSULE | ORAL | Status: AC
  Administered 2023-07-27: 100 mg via ORAL

## 2023-07-27 MED ORDER — OXYCODONE HCL 5 MG/5ML PO SOLN: 5.0000 mg | Freq: Once | ORAL | Status: DC | PRN

## 2023-07-27 MED ORDER — DEXAMETHASONE SODIUM PHOSPHATE 10 MG/ML IJ SOLN
INTRAMUSCULAR | Status: AC
Start: 2023-07-27 — End: ?
  Filled 2023-07-27: qty 1

## 2023-07-27 MED ORDER — CEFAZOLIN SODIUM-DEXTROSE 2-4 GM/100ML-% IV SOLN
2.0000 g | INTRAVENOUS | Status: AC
  Administered 2023-07-27: 2 g via INTRAVENOUS

## 2023-07-27 MED ORDER — LIDOCAINE 2% (20 MG/ML) 5 ML SYRINGE
INTRAMUSCULAR | Status: AC
  Filled 2023-07-27: qty 5

## 2023-07-27 MED ORDER — OXYCODONE HCL 5 MG PO TABS: 5.0000 mg | ORAL_TABLET | Freq: Once | ORAL | Status: DC | PRN

## 2023-07-27 MED ORDER — CEFAZOLIN SODIUM-DEXTROSE 2-4 GM/100ML-% IV SOLN
INTRAVENOUS | Status: AC
  Filled 2023-07-27: qty 100

## 2023-07-27 MED ORDER — FENTANYL CITRATE (PF) 100 MCG/2ML IJ SOLN: 25.0000 ug | INTRAMUSCULAR | Status: DC | PRN

## 2023-07-27 MED ORDER — KETOROLAC TROMETHAMINE 30 MG/ML IJ SOLN
INTRAMUSCULAR | Status: DC | PRN
Start: 2023-07-27 — End: 2023-07-27
  Administered 2023-07-27: 30 mg via INTRAVENOUS

## 2023-07-27 MED ORDER — GABAPENTIN 100 MG PO CAPS
ORAL_CAPSULE | ORAL | Status: AC
  Filled 2023-07-27: qty 1

## 2023-07-27 MED ORDER — MIDAZOLAM HCL 5 MG/5ML IJ SOLN
INTRAMUSCULAR | Status: DC | PRN
  Administered 2023-07-27: 2 mg via INTRAVENOUS

## 2023-07-27 MED ORDER — LIDOCAINE 2% (20 MG/ML) 5 ML SYRINGE
INTRAMUSCULAR | Status: DC | PRN
  Administered 2023-07-27: 60 mg via INTRAVENOUS

## 2023-07-27 MED ORDER — PHENYLEPHRINE 80 MCG/ML (10ML) SYRINGE FOR IV PUSH (FOR BLOOD PRESSURE SUPPORT)
PREFILLED_SYRINGE | INTRAVENOUS | Status: DC | PRN
  Administered 2023-07-27: 160 ug via INTRAVENOUS
  Administered 2023-07-27: 80 ug via INTRAVENOUS
  Administered 2023-07-27: 160 ug via INTRAVENOUS
  Administered 2023-07-27: 80 ug via INTRAVENOUS

## 2023-07-27 MED ORDER — ACETAMINOPHEN 500 MG PO TABS
ORAL_TABLET | ORAL | Status: AC
  Filled 2023-07-27: qty 2

## 2023-07-27 SURGICAL SUPPLY — 37 items
APPLIER CLIP 9.375 MED OPEN (MISCELLANEOUS) IMPLANT
BLADE SURG 15 STRL LF DISP TIS (BLADE) ×1 IMPLANT
CANISTER SUC SOCK COL 7IN (MISCELLANEOUS) ×1 IMPLANT
CANISTER SUCT 1200ML W/VALVE (MISCELLANEOUS) ×1 IMPLANT
CHLORAPREP W/TINT 26 (MISCELLANEOUS) ×1 IMPLANT
CLIP APPLIE 9.375 MED OPEN (MISCELLANEOUS) IMPLANT
COVER BACK TABLE 60X90IN (DRAPES) ×1 IMPLANT
COVER MAYO STAND STRL (DRAPES) ×1 IMPLANT
COVER PROBE CYLINDRICAL 5X96 (MISCELLANEOUS) ×1 IMPLANT
DERMABOND ADVANCED .7 DNX12 (GAUZE/BANDAGES/DRESSINGS) ×1 IMPLANT
DRAPE LAPAROSCOPIC ABDOMINAL (DRAPES) ×1 IMPLANT
DRAPE UTILITY XL STRL (DRAPES) ×1 IMPLANT
ELECT COATED BLADE 2.86 ST (ELECTRODE) ×1 IMPLANT
ELECT REM PT RETURN 9FT ADLT (ELECTROSURGICAL) ×1 IMPLANT
ELECTRODE REM PT RTRN 9FT ADLT (ELECTROSURGICAL) ×1 IMPLANT
GLOVE BIO SURGEON STRL SZ7.5 (GLOVE) ×2 IMPLANT
GLOVE BIOGEL PI IND STRL 6.5 (GLOVE) IMPLANT
GLOVE BIOGEL PI IND STRL 7.5 (GLOVE) IMPLANT
GLOVE SURG SS PI 7.0 STRL IVOR (GLOVE) IMPLANT
GOWN STRL REUS W/ TWL LRG LVL3 (GOWN DISPOSABLE) ×2 IMPLANT
KIT MARKER MARGIN INK (KITS) ×1 IMPLANT
NDL HYPO 25X1 1.5 SAFETY (NEEDLE) IMPLANT
NEEDLE HYPO 25X1 1.5 SAFETY (NEEDLE) ×1 IMPLANT
NS IRRIG 1000ML POUR BTL (IV SOLUTION) IMPLANT
PACK BASIN DAY SURGERY FS (CUSTOM PROCEDURE TRAY) ×1 IMPLANT
PENCIL SMOKE EVACUATOR (MISCELLANEOUS) ×1 IMPLANT
SLEEVE SCD COMPRESS KNEE MED (STOCKING) ×1 IMPLANT
SPIKE FLUID TRANSFER (MISCELLANEOUS) IMPLANT
SPONGE T-LAP 18X18 ~~LOC~~+RFID (SPONGE) ×1 IMPLANT
SUT MON AB 4-0 PC3 18 (SUTURE) ×1 IMPLANT
SUT SILK 2 0 SH (SUTURE) IMPLANT
SUT VICRYL 3-0 CR8 SH (SUTURE) ×1 IMPLANT
SYR CONTROL 10ML LL (SYRINGE) IMPLANT
TOWEL GREEN STERILE FF (TOWEL DISPOSABLE) ×1 IMPLANT
TRAY FAXITRON CT DISP (TRAY / TRAY PROCEDURE) ×1 IMPLANT
TUBE CONNECTING 20X1/4 (TUBING) ×1 IMPLANT
YANKAUER SUCT BULB TIP NO VENT (SUCTIONS) IMPLANT

## 2023-07-27 NOTE — Discharge Instructions (Addendum)
  Post Anesthesia Home Care Instructions  Activity: Get plenty of rest for the remainder of the day. A responsible individual must stay with you for 24 hours following the procedure.  For the next 24 hours, DO NOT: -Drive a car -Advertising copywriter -Drink alcoholic beverages -Take any medication unless instructed by your physician -Make any legal decisions or sign important papers.  Meals: Start with liquid foods such as gelatin or soup. Progress to regular foods as tolerated. Avoid greasy, spicy, heavy foods. If nausea and/or vomiting occur, drink only clear liquids until the nausea and/or vomiting subsides. Call your physician if vomiting continues.  Special Instructions/Symptoms: Your throat may feel dry or sore from the anesthesia or the breathing tube placed in your throat during surgery. If this causes discomfort, gargle with warm salt water. The discomfort should disappear within 24 hours.  If you had a scopolamine patch placed behind your ear for the management of post- operative nausea and/or vomiting:  1. The medication in the patch is effective for 72 hours, after which it should be removed.  Wrap patch in a tissue and discard in the trash. Wash hands thoroughly with soap and water. 2. You may remove the patch earlier than 72 hours if you experience unpleasant side effects which may include dry mouth, dizziness or visual disturbances. 3. Avoid touching the patch. Wash your hands with soap and water after contact with the patch.    No Tylenol until after 2pm today if needed No NSAIDs until after 3:15pm today if needed

## 2023-07-27 NOTE — Transfer of Care (Signed)
 Immediate Anesthesia Transfer of Care Note  Patient: Anita Fuentes  Procedure(s) Performed: RIGHT BREAST LUMPECTOMY WITH RADIOACTIVE SEED LOCALIZATION (Right: Breast)  Patient Location: PACU  Anesthesia Type:General  Level of Consciousness: drowsy and responds to stimulation  Airway & Oxygen Therapy: Patient Spontanous Breathing and Patient connected to face mask oxygen  Post-op Assessment: Report given to RN and Post -op Vital signs reviewed and stable  Post vital signs: Reviewed and stable  Last Vitals:  Vitals Value Taken Time  BP 102/77 07/27/23 0923  Temp 36.8 C 07/27/23 0923  Pulse 61 07/27/23 0924  Resp 13 07/27/23 0924  SpO2 100 % 07/27/23 0924  Vitals shown include unfiled device data.  Last Pain:  Vitals:   07/27/23 0752  TempSrc: Temporal  PainSc: 0-No pain      Patients Stated Pain Goal: 3 (07/27/23 0752)  Complications: No notable events documented.

## 2023-07-27 NOTE — H&P (Signed)
 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 Elberta Fortis to evaluate her for a right breast atypical ductal hyperplasia. The patient is a 43 year old black female who recently went for a routine screening mammogram. At that time she was found to have a 7 mm inner subareolar right breast mass. This was biopsied and came back as fragments of papilloma with atypical duct hyperplasia. She also had 3 the masses in the upper outer quadrant of the left breast measuring 2.6 cm that were biopsied in 2 different areas and came back benign. She denies any family history of breast cancer. She does smoke.  Review of Systems: A complete review of systems was obtained from the patient. I have reviewed this information and discussed as appropriate with the patient. See HPI as well for other ROS.  ROS   Medical History: Past Medical History:  Diagnosis Date  Hypertension   Patient Active Problem List  Diagnosis  Atypical ductal hyperplasia of right breast   Past Surgical History:  Procedure Laterality Date  CESAREAN SECTION 01/2002  tubal removal 01/27/2017    Allergies  Allergen Reactions  Hydrocodone-Acetaminophen Itching   Current Outpatient Medications on File Prior to Visit  Medication Sig Dispense Refill  naproxen (NAPROSYN) 500 MG tablet Take 500 mg by mouth 2 (two) times daily with meals  olmesartan-hydroCHLOROthiazide (BENICAR HCT) 20-12.5 mg tablet Take 1 tablet by mouth once daily  valACYclovir  (VALTREX) 1000 MG tablet   No current facility-administered medications on file prior to visit.   History reviewed. No pertinent family history.   Social History   Tobacco Use  Smoking Status Every Day  Types: Cigarettes  Smokeless Tobacco Never    Social History   Socioeconomic History  Marital status: Single  Tobacco Use  Smoking status: Every Day  Types: Cigarettes  Smokeless tobacco: Never  Vaping Use  Vaping status: Never Used  Substance and Sexual Activity  Alcohol use: Yes  Drug use: Never   Objective:   Vitals:  BP: 131/86  Pulse: 98  Temp: 36.7 C (98.1 F)  SpO2: 99%  Weight: 66.4 kg (146 lb 6.4 oz)  Height: 162.6 cm (5\' 4" )  PainSc: 0-No pain  PainLoc: Breast   Body mass index is 25.13 kg/m.  Physical Exam Vitals reviewed.  Constitutional:  General: She is not in acute distress. Appearance: Normal appearance.  HENT:  Head: Normocephalic and atraumatic.  Right Ear: External ear normal.  Left Ear: External ear normal.  Nose: Nose normal.  Mouth/Throat:  Mouth: Mucous membranes are moist.  Pharynx: Oropharynx is clear.  Eyes:  General: No scleral icterus. Extraocular Movements: Extraocular movements intact.  Conjunctiva/sclera: Conjunctivae normal.  Pupils: Pupils are equal, round, and reactive to light.  Cardiovascular:  Rate and Rhythm: Normal rate and regular rhythm.  Pulses: Normal pulses.  Heart sounds: Normal heart sounds.  Pulmonary:  Effort: Pulmonary effort is normal. No respiratory distress.  Breath sounds: Normal breath sounds.  Abdominal:  General: Bowel sounds are normal.  Palpations: Abdomen is soft.  Tenderness: There is no abdominal tenderness.  Musculoskeletal:  General: No swelling, tenderness or deformity. Normal range of motion.  Cervical back: Normal range of motion and neck supple.  Skin: General: Skin is warm and dry.  Coloration: Skin is not jaundiced.  Neurological:  General: No focal deficit present.   Mental Status: She is alert and oriented to person, place, and time.  Psychiatric:  Mood and Affect: Mood normal.  Behavior: Behavior normal.     Breast: There is no palpable mass in either breast. There is no palpable axillary, supraclavicular, or cervical lymphadenopathy.  Labs, Imaging and Diagnostic Testing:  Assessment and Plan:   Diagnoses and all orders for this visit:  Atypical ductal hyperplasia of right breast - [facility] Case Posting Request; Future - Ambulatory Referral to Oncology-Medical   The patient appears to have a 7 mm area of atypical ductal hyperplasia in the inner subareolar right breast. Because this can look very similar to preinvasive breast cancer and because it is considered a high risk lesion my recommendation would be to have this area removed. I have discussed with her in detail the risks and benefits of the operation as well as some of the technical aspects including use of a radioactive seed for localization and she understands and wishes to proceed. I will also refer her to the high risk clinic at the cancer center to talk about risk reduction given that her lifetime risk of breast cancer probably runs between 30 and 40% with the presence of atypical ductal hyperplasia. She is also scheduled for third biopsy of the left breast on Monday and we will follow-up on this.

## 2023-07-27 NOTE — Anesthesia Preprocedure Evaluation (Addendum)
 Anesthesia Evaluation  Patient identified by MRN, date of birth, ID band Patient awake    Reviewed: Allergy & Precautions, NPO status , Patient's Chart, lab work & pertinent test results  Airway Mallampati: I  TM Distance: >3 FB Neck ROM: Full    Dental no notable dental hx. (+) Teeth Intact, Dental Advisory Given   Pulmonary Current SmokerPatient did not abstain from smoking.   Pulmonary exam normal breath sounds clear to auscultation       Cardiovascular hypertension, Pt. on medications Normal cardiovascular exam Rhythm:Regular Rate:Normal     Neuro/Psych  Headaches PSYCHIATRIC DISORDERS  Depression       GI/Hepatic negative GI ROS, Neg liver ROS,,,  Endo/Other  negative endocrine ROS    Renal/GU negative Renal ROS  negative genitourinary   Musculoskeletal negative musculoskeletal ROS (+)    Abdominal   Peds  Hematology negative hematology ROS (+)   Anesthesia Other Findings   Reproductive/Obstetrics                             Anesthesia Physical Anesthesia Plan  ASA: 2  Anesthesia Plan: General   Post-op Pain Management: Tylenol PO (pre-op)*   Induction: Intravenous  PONV Risk Score and Plan: 2 and Ondansetron, Dexamethasone and Midazolam  Airway Management Planned: LMA  Additional Equipment:   Intra-op Plan:   Post-operative Plan: Extubation in OR  Informed Consent: I have reviewed the patients History and Physical, chart, labs and discussed the procedure including the risks, benefits and alternatives for the proposed anesthesia with the patient or authorized representative who has indicated his/her understanding and acceptance.     Dental advisory given  Plan Discussed with: CRNA  Anesthesia Plan Comments:        Anesthesia Quick Evaluation

## 2023-07-27 NOTE — Anesthesia Postprocedure Evaluation (Signed)
 Anesthesia Post Note  Patient: Anita Fuentes  Procedure(s) Performed: RIGHT BREAST LUMPECTOMY WITH RADIOACTIVE SEED LOCALIZATION (Right: Breast)     Patient location during evaluation: PACU Anesthesia Type: General Level of consciousness: awake and alert Pain management: pain level controlled Vital Signs Assessment: post-procedure vital signs reviewed and stable Respiratory status: spontaneous breathing, nonlabored ventilation, respiratory function stable and patient connected to nasal cannula oxygen Cardiovascular status: blood pressure returned to baseline and stable Postop Assessment: no apparent nausea or vomiting Anesthetic complications: no  No notable events documented.  Last Vitals:  Vitals:   07/27/23 0945 07/27/23 1020  BP: 133/88 (!) 135/96  Pulse: 70 64  Resp: 17 16  Temp:  36.7 C  SpO2: 98% 100%    Last Pain:  Vitals:   07/27/23 1020  TempSrc: Temporal  PainSc: 0-No pain                 Ollen Rao L Tal Neer

## 2023-07-27 NOTE — Op Note (Signed)
 07/27/2023  9:15 AM  PATIENT:  Anita Fuentes  43 y.o. female  PRE-OPERATIVE DIAGNOSIS:  RIGHT BREAST ADH  POST-OPERATIVE DIAGNOSIS:  RIGHT BREAST ADH  PROCEDURE:  Procedure(s): RIGHT BREAST LUMPECTOMY WITH RADIOACTIVE SEED LOCALIZATION (Right)  SURGEON:  Surgeons and Role:    * Griselda Miner, MD - Primary  PHYSICIAN ASSISTANT:   ASSISTANTS: none   ANESTHESIA:   local and general  EBL:  15 mL   BLOOD ADMINISTERED:none  DRAINS: none   LOCAL MEDICATIONS USED:  MARCAINE     SPECIMEN:  Source of Specimen:  right breast tissue  DISPOSITION OF SPECIMEN:  PATHOLOGY  COUNTS:  YES  TOURNIQUET:  * No tourniquets in log *  DICTATION: .Dragon Dictation  After informed consent was obtained the patient was brought to the operating room and placed in the supine position on the operating table.  After adequate induction of general anesthesia the patient's right breast was prepped with ChloraPrep, allowed to dry, and draped in usual sterile manner.  An appropriate timeout was performed.  Previously an I-125 seed was placed in the medial subareolar right breast to mark an area of atypical duct hyperplasia.  The neoprobe was set to I-125 in the area of radioactivity was readily identified.  The area around this was infiltrated with quarter percent Marcaine.  A curvilinear incision was then made along the inner edge of the areola of the right breast with a 15 blade knife.  The incision was carried through the skin and subcutaneous tissue sharply with the electrocautery.  Dissection was then carried laterally underneath the nipple and areola.  Once this dissection was beyond the area of the radioactive seed I then removed a circular portion of breast tissue sharply with the electrocautery around the radioactive seed while checking the area of radioactivity frequently.  Once the tissue was removed it was oriented with the appropriate paint colors.  A specimen radiograph was obtained that showed the  clip and seed to be near the center of the specimen.  The specimen was then sent to pathology for further evaluation.  Hemostasis was achieved using the Bovie electrocautery.  The wound was irrigated with saline and infiltrated with more quarter percent Marcaine.  The deep layer of the incision was then closed with layers of interrupted 3-0 Vicryl stitches.  The skin was then closed with interrupted 4-0 Monocryl subcuticular stitches.  Dermabond dressings were applied.  The patient tolerated the procedure well.  At the end of the case all needle sponge and instrument counts were correct.  The patient was then awakened and taken to recovery in stable condition.  PLAN OF CARE: Discharge to home after PACU  PATIENT DISPOSITION:  PACU - hemodynamically stable.   Delay start of Pharmacological VTE agent (>24hrs) due to surgical blood loss or risk of bleeding: not applicable

## 2023-07-27 NOTE — Interval H&P Note (Signed)
 History and Physical Interval Note:  07/27/2023 8:01 AM  Anita Fuentes  has presented today for surgery, with the diagnosis of RIGHT BREAST ADH.  The various methods of treatment have been discussed with the patient and family. After consideration of risks, benefits and other options for treatment, the patient has consented to  Procedure(s): BREAST LUMPECTOMY WITH RADIOACTIVE SEED LOCALIZATION (Right) as a surgical intervention.  The patient's history has been reviewed, patient examined, no change in status, stable for surgery.  I have reviewed the patient's chart and labs.  Questions were answered to the patient's satisfaction.     Chevis Pretty III

## 2023-07-27 NOTE — Anesthesia Procedure Notes (Signed)
 Procedure Name: LMA Insertion Date/Time: 07/27/2023 8:36 AM  Performed by: Yolanda Bonine, CRNAPre-anesthesia Checklist: Patient identified, Emergency Drugs available, Suction available, Patient being monitored and Timeout performed Patient Re-evaluated:Patient Re-evaluated prior to induction Oxygen Delivery Method: Circle system utilized Preoxygenation: Pre-oxygenation with 100% oxygen Induction Type: IV induction Ventilation: Mask ventilation without difficulty LMA: LMA inserted LMA Size: 4.0 Number of attempts: 1 Placement Confirmation: positive ETCO2 Dental Injury: Teeth and Oropharynx as per pre-operative assessment

## 2023-07-28 ENCOUNTER — Encounter (HOSPITAL_BASED_OUTPATIENT_CLINIC_OR_DEPARTMENT_OTHER): Payer: Self-pay | Admitting: General Surgery

## 2023-07-31 LAB — SURGICAL PATHOLOGY

## 2023-08-01 ENCOUNTER — Encounter: Payer: Self-pay | Admitting: General Surgery

## 2023-08-23 ENCOUNTER — Other Ambulatory Visit: Payer: Self-pay | Admitting: Family Medicine

## 2023-08-23 DIAGNOSIS — I10 Essential (primary) hypertension: Secondary | ICD-10-CM

## 2023-08-28 ENCOUNTER — Other Ambulatory Visit: Payer: Self-pay

## 2023-08-28 ENCOUNTER — Telehealth: Payer: Self-pay | Admitting: *Deleted

## 2023-08-28 NOTE — Telephone Encounter (Signed)
 This RN spoke with pt who called back wanting to know why she is being seen ( scheduled for 08/29/2023).  Note pt had surgery for Atypical hyperplasia and ADH.  This RN explained to pt she is being seen due to having a higher risk in the future of developing a breast cancer. The visit is to discuss this issue and strategies for preventing or early detection.  Per need to arrange for work coverage pt asked to be rescheduled- this RN rescheduled pt to 09/26/2023.  Pt stated appreciation of discussion and clarification of appointment.

## 2023-08-28 NOTE — Telephone Encounter (Signed)
 Pt left message - with noted interference that did not allow message to be well heard- including last name of pt.  Was able to hear pt stating she does not know why she needs to be seen tomorrow "I had my surgery and it didn't show cancer so just need to know what the appt is in regard to and what are the concerns"  Pt did not leave a return call number.  Able to locate pt by dob and schedule.  Noted pt is being seen for High Risk.  This RN called pt per number on demographic page - with no answer and automatic message stating mailbox is full. Unable to leave a response.

## 2023-08-29 ENCOUNTER — Inpatient Hospital Stay: Admitting: Hematology and Oncology

## 2023-09-05 ENCOUNTER — Ambulatory Visit: Admitting: Family Medicine

## 2023-09-05 ENCOUNTER — Encounter: Payer: Self-pay | Admitting: Family Medicine

## 2023-09-05 VITALS — BP 124/87 | HR 100 | Ht 64.0 in | Wt 146.2 lb

## 2023-09-05 DIAGNOSIS — M7581 Other shoulder lesions, right shoulder: Secondary | ICD-10-CM | POA: Diagnosis not present

## 2023-09-05 DIAGNOSIS — I10 Essential (primary) hypertension: Secondary | ICD-10-CM | POA: Diagnosis not present

## 2023-09-05 DIAGNOSIS — H00014 Hordeolum externum left upper eyelid: Secondary | ICD-10-CM | POA: Diagnosis not present

## 2023-09-05 DIAGNOSIS — N6091 Unspecified benign mammary dysplasia of right breast: Secondary | ICD-10-CM

## 2023-09-05 NOTE — Progress Notes (Signed)
    SUBJECTIVE:   CHIEF COMPLAINT / HPI:   Stye Ongoing for the past few days.  Felt like there were something in her left lower eyelid and she was continually rubbing it.  Noticed that her left eye lid became more red and swollen.  Denies drainage.  Denies pain with eye movement or blurry vision.  Hypertension: - Medications: Olmesartan -HCTZ 20-12.5 mg daily  - Compliance: Yes - Denies any SOB, CP, vision changes, LE edema, medication SEs, or symptoms of hypotension  Atypical ductal hyperplasia, right breast Had right breast lumpectomy done for atypical ductal hyperplasia with papilloma by Dr. Lillette Reid.  He referred her to the high risk breast clinic given increased lifetime risk of cancer.  Right shoulder pain Worse over the past couple of weeks.  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  Unable to rest much because her job requires lifting.  Not doing anything for supportive care planning to go to therapy ordered at prior visit.  PERTINENT  PMH / PSH: Tobacco use, chronic low back pain, HTN, anemia, MDD   OBJECTIVE:   BP 124/87   Pulse 100   Ht 5\' 4"  (1.626 m)   Wt 146 lb 3.2 oz (66.3 kg)   SpO2 99%   BMI 25.10 kg/m    General: NAD, pleasant, able to participate in exam HEENT: Erythema and edema of right upper eyelid that extends to medial corner of eyelid.  Notable port enlargement on lateral upper right eyelid.  Nonerythematous conjunctiva.  PERRLA.  No foreign body noted in upper or lower eyelid.  Right eye unremarkable MSK: No erythema, edema or ecchymosis right shoulder joint noted.  TTP over anterior joint line.  Positive empty can and Hawkins on the right arm.  Negative Neer test.  Full ROM with some pain.  5/5 shoulder flexion, abduction and resisted internal/external rotation.   ASSESSMENT/PLAN:   Assessment & Plan Hordeolum  externum of left upper eyelid Advised supportive care with warm compresses and tear free baby shampoo.  Provided work note that infection is not contagious. Right rotator cuff tendinitis Likely due to overuse from increased lifting at work.  No indication of tear or adhesive capsulitis.  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  Recommended ergonomic lifting techniques, use of Hoyer lift and supportive care with ice and Voltaren  gel. Primary hypertension 124/87, continue current therapy. Atypical ductal hyperplasia of right breast S/p right lumpectomy on 07/27/2023, recovered well from surgery.  Scheduled to follow-up with high risk breast cancer clinic for continued surveillance.   Dr. Jonne Netters, DO [facility]

## 2023-09-05 NOTE — Patient Instructions (Addendum)
 It was wonderful to see you today! Thank you for choosing [facility].   Please bring ALL of your medications with you to every visit.   Today we talked about:  You do have a stye in your eye, the treatment for it is to do warm compresses 3-4 times per day.  I also recommend getting baby shampoo that is tear free they you set up and put in and around your eye.  If you put any products that touch her eye and then touch her hands please get rid of them such as make-up as it can cause a repeat infection.  This is not contagious and will go away with time. 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  The best thing for it is to rest as much as you can and make sure you keep your arms close to your body with any lifting.  You can use ice and Voltaren  gel over the area as this will help with any inflammation you are feeling. Your blood pressure looks great!  Please continue take your medications as prescribed. I am glad you are breast surgery went well, please continue to follow-up with the high risk breast cancer center so they can discuss ongoing screening that you need.  Please follow up as needed for persistent symptoms  Call the clinic at [phone] if your symptoms worsen or you have any concerns.  Please be sure to schedule follow up at the front desk before you leave today.   Jonne Netters, DO Family Medicine

## 2023-09-05 NOTE — Assessment & Plan Note (Signed)
 124/87, continue current therapy.

## 2023-09-22 ENCOUNTER — Telehealth: Payer: Self-pay

## 2023-09-22 NOTE — Telephone Encounter (Signed)
 Pt. Requests appointment cancellation

## 2023-09-24 ENCOUNTER — Ambulatory Visit (HOSPITAL_COMMUNITY)

## 2023-09-26 ENCOUNTER — Inpatient Hospital Stay: Attending: Hematology and Oncology | Admitting: Hematology and Oncology

## 2023-12-14 ENCOUNTER — Ambulatory Visit: Admitting: Family Medicine

## 2023-12-18 NOTE — Progress Notes (Deleted)
    SUBJECTIVE:   CHIEF COMPLAINT / HPI:   Referral for legs  Tobacco use  PERTINENT  PMH / PSH: Tobacco use, chronic low back pain, HTN, anemia, MDD   OBJECTIVE:   There were no vitals taken for this visit. ***  General: NAD, pleasant, able to participate in exam Cardiac: RRR, no murmurs. Respiratory: CTAB, normal effort, No wheezes, rales or rhonchi Abdomen: Bowel sounds present, nontender, nondistended Extremities: no edema or cyanosis. Skin: warm and dry, no rashes noted Neuro: alert, no obvious focal deficits Psych: Normal affect and mood  ASSESSMENT/PLAN:   No problem-specific Assessment & Plan notes found for this encounter.     Dr. Izetta Nap, DO  [facility]    {    This will disappear when note is signed, click to select method of visit    :1}

## 2023-12-19 ENCOUNTER — Ambulatory Visit: Admitting: Family Medicine

## 2023-12-19 DIAGNOSIS — I10 Essential (primary) hypertension: Secondary | ICD-10-CM

## 2023-12-26 ENCOUNTER — Ambulatory Visit (INDEPENDENT_AMBULATORY_CARE_PROVIDER_SITE_OTHER): Admitting: Family Medicine

## 2023-12-26 ENCOUNTER — Encounter: Payer: Self-pay | Admitting: Family Medicine

## 2023-12-26 ENCOUNTER — Ambulatory Visit
Admission: RE | Admit: 2023-12-26 | Discharge: 2023-12-26 | Disposition: A | Discharge status: Home / Self Care | Source: Ambulatory Visit | Attending: Family Medicine | Admitting: Family Medicine

## 2023-12-26 VITALS — BP 136/78 | HR 64 | Ht 65.0 in | Wt 154.4 lb

## 2023-12-26 DIAGNOSIS — H0014 Chalazion left upper eyelid: Secondary | ICD-10-CM

## 2023-12-26 DIAGNOSIS — M545 Low back pain, unspecified: Secondary | ICD-10-CM

## 2023-12-26 DIAGNOSIS — M25561 Pain in right knee: Secondary | ICD-10-CM | POA: Diagnosis not present

## 2023-12-26 DIAGNOSIS — M4726 Other spondylosis with radiculopathy, lumbar region: Secondary | ICD-10-CM | POA: Diagnosis not present

## 2023-12-26 DIAGNOSIS — M1711 Unilateral primary osteoarthritis, right knee: Secondary | ICD-10-CM | POA: Diagnosis not present

## 2023-12-26 DIAGNOSIS — G8929 Other chronic pain: Secondary | ICD-10-CM

## 2023-12-26 DIAGNOSIS — M25562 Pain in left knee: Secondary | ICD-10-CM | POA: Diagnosis not present

## 2023-12-26 DIAGNOSIS — M1712 Unilateral primary osteoarthritis, left knee: Secondary | ICD-10-CM | POA: Diagnosis not present

## 2023-12-26 DIAGNOSIS — I10 Essential (primary) hypertension: Secondary | ICD-10-CM

## 2023-12-26 LAB — POCT GLYCOSYLATED HEMOGLOBIN (HGB A1C): Hemoglobin A1C: 5.6 % (ref 4.0–5.6)

## 2023-12-26 MED ORDER — OLMESARTAN MEDOXOMIL-HCTZ 20-12.5 MG PO TABS
1.0000 | ORAL_TABLET | Freq: Every day | ORAL | 1 refills | Status: AC
Start: 2023-12-26 — End: ?

## 2023-12-26 NOTE — Assessment & Plan Note (Signed)
 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 -Lumbar x-ray -Supportive care with Tylenol , ibuprofen  and lidocaine  patches -Referral to PT -Advised patient to ask about FMLA for possible work accommodation

## 2023-12-26 NOTE — Patient Instructions (Signed)
 It was wonderful to see you today! Thank you for choosing [facility].   Please bring ALL of your medications with you to every visit.   Today we talked about:  For your spot on your eye I am referring you to the eye doctor as it looks like it is too lazy in which can be a deeper inflamed cyst.  Sometimes they have to make a little incision in your eyelid to drain it.  In the meantime please continue to use warm compresses over the affected area. For your back and leg pain lets get imaging of your low back and knees to ensure there is nothing else going on.  It likely is wear-and-tear from lifting people at work.  Please ask your work about FMLA accommodations where you can get physical therapy and possibly have accommodations not to lift as much at work as that is definitely exacerbating your pain. For pain relief of your low back and legs there are limited options that are nonsedating they can use at work.  I do recommend using Tylenol  1000 mg up to 4 times daily.  You can also use the ibuprofen  800 twice per day as well.  You can also get topical lidocaine  patches and use Voltaren  gel over the affected area.  I think ultimately doing therapy and considering steroid injections in your knees and even in your back will help your pain as well. Your replete blood pressure looks good, I refilled your blood pressure medicine.  Please take it daily.  Please follow up in 3 months    We are checking some labs today. If they are abnormal, I will call you. If they are normal, I will send you a MyChart message (if it is active) or a letter in the mail. If you do not hear about your labs in the next 2 weeks, please call the office.  Call the clinic at [phone] if your symptoms worsen or you have any concerns.  Please be sure to schedule follow up at the front desk before you leave today.   Izetta Nap, DO Family Medicine

## 2023-12-26 NOTE — Progress Notes (Cosign Needed)
    SUBJECTIVE:   CHIEF COMPLAINT / HPI:   Low back and bilateral leg pain Having L leg pain all the way the outside. Also have pain in the R knee. No injury. Shoulder still hurting. Back is spasming. Having pain right in the middle of her back. Lifting a lot at work. 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  Reports she is trying to reduce the strain she has at work.  Recent menstrual cycle, in the past week. Reasonably sure not pregnant.  Eye irritation/growth Reports the growth on her left upper eyelid has now come back.  Previously treated for a stye and reports that completely went away but now it feels like it is a deeper growth.  Ongoing for the past 2 weeks.  Denies changes in vision.  Reports area is irritating and she can see it.  PERTINENT  PMH / PSH: Tobacco use, chronic low back pain, HTN, anemia, MDD   OBJECTIVE:   BP 136/78   Pulse 64   Ht 5' 5 (1.651 m)   Wt 154 lb 6.4 oz (70 kg)   LMP 12/19/2023   SpO2 100%   BMI 25.69 kg/m   General: NAD, pleasant, able to participate in exam HEENT: Left upper eyelid with erythematous nodule on lateral eyelid.  No discrete pore on upper eyelid.  No conjunctiva's or drainage from eye noted. Cardiac: RRR, no murmurs. Respiratory: CTAB, normal effort, No wheezes, rales or rhonchi MSK: Low back, legs -No erythema, deformity noted -TTP over lumbar spinous process diffusely and bilateral paraspinal musculature. -5/5 hip flexion and knee flexion/extension bilaterally. -Neurovascularly intact distally -Positive slump test on left leg.  Negative on right leg. Extremities: no edema or cyanosis. Skin: warm and dry, no rashes noted Neuro: alert, no obvious focal deficits Psych: Normal affect and mood  ASSESSMENT/PLAN:   Assessment & Plan Primary hypertension 136/78 upon repeat, had not taken medications a.m.   Recommend taking medication and monitoring BP at home. 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 -Lumbar x-ray -Supportive care with Tylenol , ibuprofen  and lidocaine  patches -Referral to PT -Advised patient to ask about FMLA for possible work accommodation Chronic pain of both knees Likely secondary to overuse as above.  No prior imaging available. -Bilateral knee x-ray -Consider steroid injections if persistent pain Chalazion of left upper eyelid Present upon exam, given persistence of symptoms will refer to ophthalmology for further evaluation and possible drainage. -Referral to ophthalmology     Dr. Izetta Nap, DO [facility]

## 2023-12-26 NOTE — Assessment & Plan Note (Signed)
 136/78 upon repeat, had not taken medications a.m.  Recommend taking medication and monitoring BP at home. -Refill olmesartan -HCTZ -Lipid panel

## 2023-12-27 ENCOUNTER — Ambulatory Visit: Payer: Self-pay | Admitting: Family Medicine

## 2023-12-27 LAB — BASIC METABOLIC PANEL WITH GFR
BUN/Creatinine Ratio: 11 (ref 9–23)
BUN: 11 mg/dL (ref 6–24)
CO2: 21 mmol/L (ref 20–29)
Calcium: 9.1 mg/dL (ref 8.7–10.2)
Chloride: 105 mmol/L (ref 96–106)
Creatinine, Ser: 0.98 mg/dL (ref 0.57–1.00)
Glucose: 89 mg/dL (ref 70–99)
Potassium: 4 mmol/L (ref 3.5–5.2)
Sodium: 140 mmol/L (ref 134–144)
eGFR: 73 mL/min/1.73 (ref >59)

## 2023-12-27 LAB — LIPID PANEL
Chol/HDL Ratio: 2.1 ratio (ref 0.0–4.4)
Cholesterol, Total: 138 mg/dL (ref 100–199)
HDL: 65 mg/dL (ref >39)
LDL Chol Calc (NIH): 61 mg/dL (ref 0–99)
Triglycerides: 57 mg/dL (ref 0–149)
VLDL Cholesterol Cal: 12 mg/dL (ref 5–40)

## 2024-01-09 ENCOUNTER — Telehealth: Payer: Self-pay

## 2024-01-09 NOTE — Telephone Encounter (Signed)
 [facility] calls nurse line in regards to referral.   She reports referral was received, however no demographics.   She reports to fax all demographics to [phone].  Will forward to referral team.

## 2024-02-22 NOTE — Progress Notes (Signed)
    SUBJECTIVE:   CHIEF COMPLAINT / HPI:   R shoulder pain Worse over the past month. Works as LAWYER, hospital doctor at work anymore. Taking Ibuprofen  which eases some pain due does not resolved it. Doing PT at work regularly without relief. Difficulty sleeping due to pain.  R knee pain Chronic, persistent. XR 01/2024 with mild OA. Doing PT at work as above.  Umbilical hernia Worsening, now feels like it is bulging in multiple areas. States she feels discomfort in the area with bloating. Would like to get it fixed before getting worse.  PERTINENT  PMH / PSH: Tobacco use, chronic low back pain, HTN, anemia, MDD   OBJECTIVE:   BP (!) 137/92   Pulse 71   Ht 5' 5 (1.651 m)   Wt 155 lb (70.3 kg)   SpO2 96%   BMI 25.79 kg/m    General: NAD, pleasant, able to participate in exam Cardiac: RRR, no murmurs. Respiratory: CTAB, normal effort, No wheezes, rales or rhonchi Abdomen: Soft, non-distended, non-tender. Quarter-sized bulge over umbilicus that is easily reducible. Additional ~1cm in diameter palpable defect in the abdominal wall about 2 inches above the umbilicus that is easily reducible. No surrounding erythema or pain with palpation. MSK: R knee: -TTP over lateral joint line -Negative varus, valgus, anterior and posterior drawer -5/5 strength in knee flexion/extension R shoulder: -TTP over posterior glenohumeral joint line -Full ROM with pain at full abduction and flexion -Positive Neer and Hawkins -Negative empty can -5/5 should flexion/abduction Extremities: no edema or cyanosis. Skin: warm and dry, no rashes noted Neuro: alert, no obvious focal deficits Psych: Normal affect and mood  ASSESSMENT/PLAN:   Assessment & Plan Chronic right shoulder pain Most consistent with impingement syndrome, not improved despite PT and NSAIDs. Agree to subacromial corticosteroid injection, performed as below. F/u in 4-6 weeks to assess improvement. Umbilical hernia without  obstruction and without gangrene Two palpable deficits, easily reducible. Desires repair given intermittent symptoms. ED precautions discussed -Referral to General Surgery Chronic pain of both knees XR with mild OA, now not lifting patient at work which was likely exacerbating symptoms. Continue supportive care with PT and Tylenol /Ibuprofen . Consider injection if persistent at follow up. Chalazion of left upper eyelid Persistent, provided with referral information for ophalmology Tobacco use disorder Currently 1 pp/day, recommended cutting back in the setting of elective hernia repair as above. Goal to reduce to 1/2pp/day. Declined additional management.     Right subacromial injection After informed written consent timeout was performed, patient was seated in chair in exam room. Right shoulder was prepped with alcohol swab and utilizing lateral approach, patient's right subacromial space was injected with 4:1 lidocaine : depomedrol. Patient tolerated the procedure well without immediate complications.   Dr. Izetta Nap, DO [facility]

## 2024-02-27 ENCOUNTER — Encounter: Payer: Self-pay | Admitting: Family Medicine

## 2024-02-27 ENCOUNTER — Ambulatory Visit: Admitting: Family Medicine

## 2024-02-27 VITALS — BP 137/92 | HR 71 | Ht 65.0 in | Wt 155.0 lb

## 2024-02-27 DIAGNOSIS — M25511 Pain in right shoulder: Secondary | ICD-10-CM | POA: Diagnosis not present

## 2024-02-27 DIAGNOSIS — F17200 Nicotine dependence, unspecified, uncomplicated: Secondary | ICD-10-CM | POA: Diagnosis not present

## 2024-02-27 DIAGNOSIS — G8929 Other chronic pain: Secondary | ICD-10-CM | POA: Diagnosis not present

## 2024-02-27 DIAGNOSIS — M25562 Pain in left knee: Secondary | ICD-10-CM | POA: Diagnosis not present

## 2024-02-27 DIAGNOSIS — K429 Umbilical hernia without obstruction or gangrene: Secondary | ICD-10-CM | POA: Diagnosis not present

## 2024-02-27 DIAGNOSIS — M25561 Pain in right knee: Secondary | ICD-10-CM | POA: Diagnosis not present

## 2024-02-27 DIAGNOSIS — H0014 Chalazion left upper eyelid: Secondary | ICD-10-CM | POA: Diagnosis not present

## 2024-02-27 MED ORDER — METHYLPREDNISOLONE ACETATE 40 MG/ML IJ SUSP
40.0000 mg | Freq: Once | INTRAMUSCULAR | Status: AC
  Administered 2024-02-27: 40 mg via INTRA_ARTICULAR

## 2024-02-27 NOTE — Assessment & Plan Note (Signed)
 Two palpable deficits, easily reducible. Desires repair given intermittent symptoms. ED precautions discussed -Referral to General Surgery

## 2024-02-27 NOTE — Assessment & Plan Note (Signed)
 Currently 1 pp/day, recommended cutting back in the setting of elective hernia repair as above. Goal to reduce to 1/2pp/day. Declined additional management.

## 2024-02-27 NOTE — Patient Instructions (Addendum)
 It was wonderful to see you today! Thank you for choosing [facility].   Please bring ALL of your medications with you to every visit.   Today we talked about:  We injected your right shoulder today.  As we discussed it has lidocaine  that will help with the pain today and this will wear off and then the steroid will take a couple of days to kick in.  Please continue to do your range of motion exercises and do therapy as you have been at work.  You can take the ibuprofen  as needed but I would use it sparingly and you can also utilize Tylenol .  You can also use topical lidocaine  patches or topical Voltaren  gel to help with your symptoms. For your right knee please continue to avoid any heavy lifting or strenuous activity.  You can also use the Tylenol  and ibuprofen  and Voltaren  gel as above for symptom relief.  If you continue to have persistent pain that is bothersome we can discuss doing a steroid injection in your knee as well in the future. Please call the eye doctor to discuss the issue with your eye:  [facility] [address] [place] [phone]  Please follow up in 6 weeks  Call the clinic at [phone] if your symptoms worsen or you have any concerns.  Please be sure to schedule follow up at the front desk before you leave today.   Izetta Nap, DO Family Medicine    Joint Injection, Care After Refer to this sheet in the next few weeks. These instructions provide you with information about caring for yourself after your procedure. Your health care provider may also give you more specific instructions. Your treatment has been planned according to current medical practices, but problems sometimes occur. Call your health care provider if you have any problems or questions after your procedure. What can I expect after the procedure? After the procedure, it is common to have: Soreness. Warmth. Swelling. You may have more pain, swelling, and warmth than you did before  the injection. This reaction may last for about one day. Follow these instructions at home: Bathing If you were given a bandage (dressing), keep it dry until your health care provider says it can be removed. Ask your health care provider when you can start showering or taking a bath. Managing pain, stiffness, and swelling If directed, apply ice to the injection area: Put ice in a plastic bag. Place a towel between your skin and the bag. Leave the ice on for 20 minutes, 2-3 times per day. Do not apply heat to your knee. Raise the injection area above the level of your heart while you are sitting or lying down. Activity Avoid strenuous activities for as long as directed by your health care provider. Ask your health care provider when you can return to your normal activities. General instructions Take medicines only as directed by your health care provider. Do not take aspirin or other over-the-counter medicines unless your health care provider says you can. Check your injection site every day for signs of infection. Watch for: Redness, swelling, or pain. Fluid, blood, or pus. Follow your health care provider's instructions about dressing changes and removal. Contact a health care provider if: You have symptoms at your injection site that last longer than two days after your procedure. You have redness, swelling, or pain in your injection area. You have fluid, blood, or pus coming from your injection site. You have warmth in your injection  area. You have a fever. Your pain is not controlled with medicine. Get help right away if:
